# Patient Record
Sex: Male | Born: 1935 | Race: White | Hispanic: No | Smoking: Never smoker
Health system: Southern US, Community
[De-identification: ages and names within clinical notes are randomized; demographics above are authoritative.]

## PROBLEM LIST (undated history)

## (undated) DIAGNOSIS — I10 Essential (primary) hypertension: Secondary | ICD-10-CM

## (undated) DIAGNOSIS — D72829 Elevated white blood cell count, unspecified: Secondary | ICD-10-CM

## (undated) DIAGNOSIS — I5032 Chronic diastolic (congestive) heart failure: Secondary | ICD-10-CM

## (undated) DIAGNOSIS — E46 Unspecified protein-calorie malnutrition: Secondary | ICD-10-CM

## (undated) DIAGNOSIS — E785 Hyperlipidemia, unspecified: Secondary | ICD-10-CM

## (undated) DIAGNOSIS — L03115 Cellulitis of right lower limb: Secondary | ICD-10-CM

## (undated) DIAGNOSIS — N183 Chronic kidney disease, stage 3 unspecified: Secondary | ICD-10-CM

## (undated) DIAGNOSIS — R05 Cough: Secondary | ICD-10-CM

## (undated) DIAGNOSIS — R053 Chronic cough: Secondary | ICD-10-CM

## (undated) DIAGNOSIS — I739 Peripheral vascular disease, unspecified: Secondary | ICD-10-CM

## (undated) DIAGNOSIS — C801 Malignant (primary) neoplasm, unspecified: Secondary | ICD-10-CM

## (undated) DIAGNOSIS — W19XXXA Unspecified fall, initial encounter: Secondary | ICD-10-CM

## (undated) DIAGNOSIS — E119 Type 2 diabetes mellitus without complications: Secondary | ICD-10-CM

## (undated) DIAGNOSIS — I509 Heart failure, unspecified: Secondary | ICD-10-CM

## (undated) DIAGNOSIS — R531 Weakness: Secondary | ICD-10-CM

## (undated) DIAGNOSIS — I4891 Unspecified atrial fibrillation: Secondary | ICD-10-CM

## (undated) DIAGNOSIS — I2510 Atherosclerotic heart disease of native coronary artery without angina pectoris: Secondary | ICD-10-CM

## (undated) DIAGNOSIS — E871 Hypo-osmolality and hyponatremia: Secondary | ICD-10-CM

## (undated) HISTORY — DX: Type 2 diabetes mellitus without complications: E11.9

## (undated) HISTORY — DX: Unspecified fall, initial encounter: W19.XXXA

## (undated) HISTORY — DX: Unspecified atrial fibrillation: I48.91

## (undated) HISTORY — DX: Chronic kidney disease, stage 3 unspecified: N18.30

## (undated) HISTORY — DX: Hypo-osmolality and hyponatremia: E87.1

## (undated) HISTORY — DX: Chronic diastolic (congestive) heart failure: I50.32

## (undated) HISTORY — DX: Chronic cough: R05.3

## (undated) HISTORY — DX: Hyperlipidemia, unspecified: E78.5

## (undated) HISTORY — DX: Cough: R05

## (undated) HISTORY — PX: CORONARY ARTERY BYPASS GRAFT: SHX141

## (undated) HISTORY — DX: Unspecified protein-calorie malnutrition: E46

## (undated) HISTORY — DX: Essential (primary) hypertension: I10

## (undated) HISTORY — PX: PACEMAKER INSERTION: SHX728

## (undated) HISTORY — PX: CARDIAC CATHETERIZATION: SHX172

## (undated) HISTORY — PX: CHOLECYSTECTOMY: SHX55

## (undated) HISTORY — DX: Elevated white blood cell count, unspecified: D72.829

## (undated) HISTORY — DX: Peripheral vascular disease, unspecified: I73.9

## (undated) HISTORY — DX: Weakness: R53.1

## (undated) HISTORY — DX: Chronic kidney disease, stage 3 (moderate): N18.3

## (undated) HISTORY — DX: Atherosclerotic heart disease of native coronary artery without angina pectoris: I25.10

## (undated) HISTORY — DX: Cellulitis of right lower limb: L03.115

---

## 1997-06-18 ENCOUNTER — Ambulatory Visit (HOSPITAL_COMMUNITY): Admission: RE | Admit: 1997-06-18 | Discharge: 1997-06-18 | Payer: Self-pay | Admitting: Internal Medicine

## 1997-09-26 ENCOUNTER — Ambulatory Visit (HOSPITAL_COMMUNITY): Admission: RE | Admit: 1997-09-26 | Discharge: 1997-09-26 | Payer: Self-pay | Admitting: Gastroenterology

## 1998-01-02 ENCOUNTER — Encounter (HOSPITAL_COMMUNITY): Payer: Self-pay | Admitting: Oncology

## 1998-01-02 ENCOUNTER — Ambulatory Visit (HOSPITAL_COMMUNITY): Admission: RE | Admit: 1998-01-02 | Discharge: 1998-01-02 | Payer: Self-pay | Admitting: Oncology

## 1998-01-18 HISTORY — PX: CATARACT EXTRACTION: SUR2

## 1998-02-21 ENCOUNTER — Encounter: Payer: Self-pay | Admitting: Neurosurgery

## 1998-02-21 ENCOUNTER — Inpatient Hospital Stay (HOSPITAL_COMMUNITY): Admission: RE | Admit: 1998-02-21 | Discharge: 1998-02-22 | Payer: Self-pay | Admitting: Neurosurgery

## 1998-04-09 ENCOUNTER — Ambulatory Visit (HOSPITAL_COMMUNITY): Admission: RE | Admit: 1998-04-09 | Discharge: 1998-04-09 | Payer: Self-pay | Admitting: Neurosurgery

## 1998-04-09 ENCOUNTER — Encounter: Payer: Self-pay | Admitting: Neurosurgery

## 1998-05-20 ENCOUNTER — Encounter: Payer: Self-pay | Admitting: Neurosurgery

## 1998-05-20 ENCOUNTER — Ambulatory Visit (HOSPITAL_COMMUNITY): Admission: RE | Admit: 1998-05-20 | Discharge: 1998-05-20 | Payer: Self-pay | Admitting: Neurosurgery

## 1999-03-23 ENCOUNTER — Encounter: Payer: Self-pay | Admitting: Emergency Medicine

## 1999-03-23 ENCOUNTER — Inpatient Hospital Stay (HOSPITAL_COMMUNITY): Admission: EM | Admit: 1999-03-23 | Discharge: 1999-03-25 | Payer: Self-pay | Admitting: Emergency Medicine

## 1999-07-21 ENCOUNTER — Ambulatory Visit (HOSPITAL_COMMUNITY): Admission: RE | Admit: 1999-07-21 | Discharge: 1999-07-22 | Payer: Self-pay | Admitting: Cardiology

## 1999-10-15 ENCOUNTER — Ambulatory Visit (HOSPITAL_COMMUNITY): Admission: RE | Admit: 1999-10-15 | Discharge: 1999-10-15 | Payer: Self-pay | Admitting: Gastroenterology

## 2000-02-05 ENCOUNTER — Encounter: Payer: Self-pay | Admitting: Cardiology

## 2000-02-06 ENCOUNTER — Inpatient Hospital Stay (HOSPITAL_COMMUNITY): Admission: RE | Admit: 2000-02-06 | Discharge: 2000-02-18 | Payer: Self-pay | Admitting: Cardiology

## 2000-02-09 ENCOUNTER — Encounter: Payer: Self-pay | Admitting: Cardiothoracic Surgery

## 2000-02-10 ENCOUNTER — Encounter: Payer: Self-pay | Admitting: Cardiothoracic Surgery

## 2000-02-11 ENCOUNTER — Encounter: Payer: Self-pay | Admitting: Cardiothoracic Surgery

## 2000-02-13 ENCOUNTER — Encounter: Payer: Self-pay | Admitting: Thoracic Surgery (Cardiothoracic Vascular Surgery)

## 2000-02-15 ENCOUNTER — Encounter: Payer: Self-pay | Admitting: Cardiothoracic Surgery

## 2000-02-16 ENCOUNTER — Encounter: Payer: Self-pay | Admitting: Cardiothoracic Surgery

## 2000-04-12 ENCOUNTER — Encounter (HOSPITAL_COMMUNITY): Admission: RE | Admit: 2000-04-12 | Discharge: 2000-07-10 | Payer: Self-pay | Admitting: Cardiology

## 2000-07-11 ENCOUNTER — Encounter (HOSPITAL_COMMUNITY): Admission: RE | Admit: 2000-07-11 | Discharge: 2000-10-09 | Payer: Self-pay | Admitting: Cardiology

## 2000-10-10 ENCOUNTER — Encounter (HOSPITAL_COMMUNITY): Admission: RE | Admit: 2000-10-10 | Discharge: 2001-01-08 | Payer: Self-pay | Admitting: Cardiology

## 2001-01-18 ENCOUNTER — Encounter (HOSPITAL_COMMUNITY): Admission: RE | Admit: 2001-01-18 | Discharge: 2001-04-18 | Payer: Self-pay | Admitting: Cardiology

## 2001-04-19 ENCOUNTER — Encounter (HOSPITAL_COMMUNITY): Admission: RE | Admit: 2001-04-19 | Discharge: 2001-07-18 | Payer: Self-pay | Admitting: Cardiology

## 2001-07-19 ENCOUNTER — Encounter (HOSPITAL_COMMUNITY): Admission: RE | Admit: 2001-07-19 | Discharge: 2001-10-17 | Payer: Self-pay | Admitting: Cardiology

## 2001-10-18 ENCOUNTER — Encounter (HOSPITAL_COMMUNITY): Admission: RE | Admit: 2001-10-18 | Discharge: 2002-01-16 | Payer: Self-pay | Admitting: Cardiology

## 2002-01-18 ENCOUNTER — Encounter (HOSPITAL_COMMUNITY): Admission: RE | Admit: 2002-01-18 | Discharge: 2002-04-18 | Payer: Self-pay | Admitting: Cardiology

## 2002-04-19 ENCOUNTER — Encounter (HOSPITAL_COMMUNITY): Admission: RE | Admit: 2002-04-19 | Discharge: 2002-07-18 | Payer: Self-pay | Admitting: Cardiology

## 2002-07-19 ENCOUNTER — Encounter (HOSPITAL_COMMUNITY): Admission: RE | Admit: 2002-07-19 | Discharge: 2002-10-17 | Payer: Self-pay | Admitting: Cardiology

## 2002-08-21 ENCOUNTER — Inpatient Hospital Stay (HOSPITAL_COMMUNITY): Admission: EM | Admit: 2002-08-21 | Discharge: 2002-08-23 | Payer: Self-pay | Admitting: Emergency Medicine

## 2002-08-23 ENCOUNTER — Encounter: Payer: Self-pay | Admitting: Cardiology

## 2002-10-19 ENCOUNTER — Encounter (HOSPITAL_COMMUNITY): Admission: RE | Admit: 2002-10-19 | Discharge: 2003-01-17 | Payer: Self-pay | Admitting: Cardiology

## 2003-01-19 ENCOUNTER — Encounter (HOSPITAL_COMMUNITY): Admission: RE | Admit: 2003-01-19 | Discharge: 2003-04-19 | Payer: Self-pay | Admitting: Cardiology

## 2003-04-20 ENCOUNTER — Encounter (HOSPITAL_COMMUNITY): Admission: RE | Admit: 2003-04-20 | Discharge: 2003-07-19 | Payer: Self-pay | Admitting: Cardiology

## 2003-07-23 ENCOUNTER — Encounter (HOSPITAL_COMMUNITY): Admission: RE | Admit: 2003-07-23 | Discharge: 2003-10-21 | Payer: Self-pay | Admitting: Cardiology

## 2003-10-22 ENCOUNTER — Encounter (HOSPITAL_COMMUNITY): Admission: RE | Admit: 2003-10-22 | Discharge: 2004-01-20 | Payer: Self-pay | Admitting: Cardiology

## 2004-01-21 ENCOUNTER — Encounter (HOSPITAL_COMMUNITY): Admission: RE | Admit: 2004-01-21 | Discharge: 2004-04-20 | Payer: Self-pay | Admitting: Cardiology

## 2004-05-18 ENCOUNTER — Encounter (HOSPITAL_COMMUNITY): Admission: RE | Admit: 2004-05-18 | Discharge: 2004-08-16 | Payer: Self-pay | Admitting: Cardiology

## 2004-08-19 ENCOUNTER — Encounter (HOSPITAL_COMMUNITY): Admission: RE | Admit: 2004-08-19 | Discharge: 2004-11-17 | Payer: Self-pay | Admitting: Cardiology

## 2004-11-18 ENCOUNTER — Encounter (HOSPITAL_COMMUNITY): Admission: RE | Admit: 2004-11-18 | Discharge: 2005-02-16 | Payer: Self-pay | Admitting: Cardiology

## 2004-12-18 ENCOUNTER — Ambulatory Visit (HOSPITAL_COMMUNITY): Admission: RE | Admit: 2004-12-18 | Discharge: 2004-12-18 | Payer: Self-pay | Admitting: Gastroenterology

## 2004-12-18 ENCOUNTER — Encounter (INDEPENDENT_AMBULATORY_CARE_PROVIDER_SITE_OTHER): Payer: Self-pay | Admitting: Specialist

## 2005-02-18 ENCOUNTER — Encounter (HOSPITAL_COMMUNITY): Admission: RE | Admit: 2005-02-18 | Discharge: 2005-05-19 | Payer: Self-pay | Admitting: Cardiology

## 2005-03-08 ENCOUNTER — Ambulatory Visit (HOSPITAL_COMMUNITY): Admission: RE | Admit: 2005-03-08 | Discharge: 2005-03-08 | Payer: Self-pay | Admitting: Gastroenterology

## 2005-05-20 ENCOUNTER — Encounter (HOSPITAL_COMMUNITY): Admission: RE | Admit: 2005-05-20 | Discharge: 2005-08-18 | Payer: Self-pay | Admitting: Cardiology

## 2005-08-19 ENCOUNTER — Encounter (HOSPITAL_COMMUNITY): Admission: RE | Admit: 2005-08-19 | Discharge: 2005-11-17 | Payer: Self-pay | Admitting: Cardiology

## 2005-11-18 ENCOUNTER — Encounter (HOSPITAL_COMMUNITY): Admission: RE | Admit: 2005-11-18 | Discharge: 2006-02-16 | Payer: Self-pay | Admitting: Cardiology

## 2006-02-18 ENCOUNTER — Encounter (HOSPITAL_COMMUNITY): Admission: RE | Admit: 2006-02-18 | Discharge: 2006-05-19 | Payer: Self-pay | Admitting: Cardiology

## 2006-05-20 ENCOUNTER — Encounter (HOSPITAL_COMMUNITY): Admission: RE | Admit: 2006-05-20 | Discharge: 2006-08-18 | Payer: Self-pay | Admitting: Cardiology

## 2006-08-19 ENCOUNTER — Encounter (HOSPITAL_COMMUNITY): Admission: RE | Admit: 2006-08-19 | Discharge: 2006-11-17 | Payer: Self-pay | Admitting: Cardiology

## 2006-11-19 ENCOUNTER — Encounter (HOSPITAL_COMMUNITY): Admission: RE | Admit: 2006-11-19 | Discharge: 2007-01-17 | Payer: Self-pay | Admitting: Cardiology

## 2007-01-19 ENCOUNTER — Encounter (HOSPITAL_COMMUNITY): Admission: RE | Admit: 2007-01-19 | Discharge: 2007-04-13 | Payer: Self-pay | Admitting: Cardiology

## 2007-04-19 ENCOUNTER — Encounter (HOSPITAL_COMMUNITY): Admission: RE | Admit: 2007-04-19 | Discharge: 2007-07-18 | Payer: Self-pay | Admitting: Cardiology

## 2007-07-20 ENCOUNTER — Encounter (HOSPITAL_COMMUNITY): Admission: RE | Admit: 2007-07-20 | Discharge: 2007-10-18 | Payer: Self-pay | Admitting: Cardiology

## 2007-10-19 ENCOUNTER — Encounter (HOSPITAL_COMMUNITY): Admission: RE | Admit: 2007-10-19 | Discharge: 2008-01-15 | Payer: Self-pay | Admitting: Cardiology

## 2008-01-19 ENCOUNTER — Encounter (HOSPITAL_COMMUNITY): Admission: RE | Admit: 2008-01-19 | Discharge: 2008-04-18 | Payer: Self-pay | Admitting: Cardiology

## 2008-04-19 ENCOUNTER — Encounter (HOSPITAL_COMMUNITY): Admission: RE | Admit: 2008-04-19 | Discharge: 2008-07-18 | Payer: Self-pay | Admitting: Cardiology

## 2008-07-17 ENCOUNTER — Encounter: Payer: Self-pay | Admitting: Internal Medicine

## 2008-07-19 ENCOUNTER — Encounter (HOSPITAL_COMMUNITY): Admission: RE | Admit: 2008-07-19 | Discharge: 2008-10-17 | Payer: Self-pay | Admitting: Cardiology

## 2008-10-18 ENCOUNTER — Encounter (HOSPITAL_COMMUNITY): Admission: RE | Admit: 2008-10-18 | Discharge: 2009-01-16 | Payer: Self-pay | Admitting: Cardiology

## 2009-01-18 ENCOUNTER — Encounter (HOSPITAL_COMMUNITY): Admission: RE | Admit: 2009-01-18 | Discharge: 2009-04-18 | Payer: Self-pay | Admitting: Cardiology

## 2009-04-19 ENCOUNTER — Encounter (HOSPITAL_COMMUNITY): Admission: RE | Admit: 2009-04-19 | Discharge: 2009-07-18 | Payer: Self-pay | Admitting: Cardiology

## 2009-07-19 ENCOUNTER — Encounter (HOSPITAL_COMMUNITY): Admission: RE | Admit: 2009-07-19 | Discharge: 2009-10-17 | Payer: Self-pay | Admitting: Cardiology

## 2009-07-25 DIAGNOSIS — I4891 Unspecified atrial fibrillation: Secondary | ICD-10-CM | POA: Insufficient documentation

## 2009-07-25 DIAGNOSIS — I10 Essential (primary) hypertension: Secondary | ICD-10-CM | POA: Insufficient documentation

## 2009-07-25 DIAGNOSIS — I2510 Atherosclerotic heart disease of native coronary artery without angina pectoris: Secondary | ICD-10-CM | POA: Insufficient documentation

## 2009-07-28 ENCOUNTER — Ambulatory Visit: Payer: Self-pay | Admitting: Internal Medicine

## 2009-07-28 DIAGNOSIS — Z950 Presence of cardiac pacemaker: Secondary | ICD-10-CM

## 2009-07-29 ENCOUNTER — Encounter: Payer: Self-pay | Admitting: Cardiovascular Disease

## 2009-07-29 LAB — CONVERTED CEMR LAB
POC INR: 1.6
Prothrombin Time: 17.2 s

## 2009-07-30 ENCOUNTER — Encounter: Payer: Self-pay | Admitting: Internal Medicine

## 2009-07-30 LAB — CONVERTED CEMR LAB
INR: 1.6 — ABNORMAL HIGH (ref 0.8–1.0)
Prothrombin Time: 17.2 s — ABNORMAL HIGH (ref 9.7–11.8)

## 2009-08-04 ENCOUNTER — Encounter: Payer: Self-pay | Admitting: Internal Medicine

## 2009-08-11 ENCOUNTER — Ambulatory Visit: Payer: Self-pay | Admitting: Cardiology

## 2009-08-11 LAB — CONVERTED CEMR LAB: POC INR: 2.7

## 2009-08-13 ENCOUNTER — Encounter: Payer: Self-pay | Admitting: Internal Medicine

## 2009-09-01 ENCOUNTER — Encounter: Payer: Self-pay | Admitting: Cardiology

## 2009-09-02 ENCOUNTER — Encounter: Payer: Self-pay | Admitting: Cardiology

## 2009-09-25 ENCOUNTER — Ambulatory Visit: Payer: Self-pay | Admitting: Internal Medicine

## 2009-09-25 LAB — CONVERTED CEMR LAB: POC INR: 2.2

## 2009-10-18 ENCOUNTER — Encounter (HOSPITAL_COMMUNITY)
Admission: RE | Admit: 2009-10-18 | Discharge: 2010-01-16 | Payer: Self-pay | Source: Home / Self Care | Attending: Internal Medicine | Admitting: Internal Medicine

## 2009-10-21 ENCOUNTER — Ambulatory Visit: Payer: Self-pay | Admitting: Cardiology

## 2009-10-27 ENCOUNTER — Encounter: Payer: Self-pay | Admitting: Internal Medicine

## 2009-12-01 ENCOUNTER — Ambulatory Visit: Payer: Self-pay | Admitting: Cardiology

## 2009-12-01 ENCOUNTER — Ambulatory Visit: Payer: Self-pay | Admitting: Internal Medicine

## 2009-12-01 ENCOUNTER — Telehealth: Payer: Self-pay | Admitting: Internal Medicine

## 2009-12-03 ENCOUNTER — Telehealth (INDEPENDENT_AMBULATORY_CARE_PROVIDER_SITE_OTHER): Payer: Self-pay | Admitting: *Deleted

## 2009-12-25 ENCOUNTER — Telehealth: Payer: Self-pay | Admitting: Internal Medicine

## 2009-12-29 ENCOUNTER — Ambulatory Visit: Payer: Self-pay

## 2009-12-30 ENCOUNTER — Encounter: Payer: Self-pay | Admitting: Internal Medicine

## 2009-12-31 ENCOUNTER — Encounter: Payer: Self-pay | Admitting: Internal Medicine

## 2009-12-31 ENCOUNTER — Ambulatory Visit: Payer: Self-pay | Admitting: Internal Medicine

## 2010-01-01 ENCOUNTER — Encounter: Payer: Self-pay | Admitting: Internal Medicine

## 2010-01-01 LAB — CONVERTED CEMR LAB
Basophils Absolute: 0 10*3/uL (ref 0.0–0.1)
Calcium: 8.5 mg/dL (ref 8.4–10.5)
Digitoxin Lvl: 1.3 ng/mL (ref 0.8–2.0)
GFR calc non Af Amer: 56.95 mL/min — ABNORMAL LOW (ref >60.00)
Glucose, Bld: 127 mg/dL — ABNORMAL HIGH (ref 70–99)
Hemoglobin: 13.2 g/dL (ref 13.0–17.0)
INR: 2.2 — ABNORMAL HIGH (ref 0.8–1.0)
Lymphocytes Relative: 31.8 % (ref 12.0–46.0)
Monocytes Relative: 10.4 % (ref 3.0–12.0)
Neutro Abs: 3.8 10*3/uL (ref 1.4–7.7)
Platelets: 145 10*3/uL — ABNORMAL LOW (ref 150.0–400.0)
RDW: 13.9 % (ref 11.5–14.6)
Sodium: 138 meq/L (ref 135–145)
WBC: 7 10*3/uL (ref 4.5–10.5)
aPTT: 33.7 s — ABNORMAL HIGH (ref 21.7–28.8)

## 2010-01-07 ENCOUNTER — Ambulatory Visit (HOSPITAL_COMMUNITY)
Admission: RE | Admit: 2010-01-07 | Discharge: 2010-01-07 | Payer: Self-pay | Source: Home / Self Care | Attending: Internal Medicine | Admitting: Internal Medicine

## 2010-01-15 ENCOUNTER — Ambulatory Visit: Payer: Self-pay

## 2010-01-20 ENCOUNTER — Encounter (HOSPITAL_COMMUNITY)
Admission: RE | Admit: 2010-01-20 | Discharge: 2010-02-17 | Payer: Self-pay | Source: Home / Self Care | Attending: Internal Medicine | Admitting: Internal Medicine

## 2010-01-22 ENCOUNTER — Ambulatory Visit: Admission: RE | Admit: 2010-01-22 | Discharge: 2010-01-22 | Payer: Self-pay | Source: Home / Self Care

## 2010-01-22 ENCOUNTER — Encounter: Payer: Self-pay | Admitting: Internal Medicine

## 2010-02-02 ENCOUNTER — Telehealth: Payer: Self-pay | Admitting: Internal Medicine

## 2010-02-18 ENCOUNTER — Encounter (HOSPITAL_COMMUNITY): Payer: Self-pay | Attending: Internal Medicine

## 2010-02-18 DIAGNOSIS — Z8249 Family history of ischemic heart disease and other diseases of the circulatory system: Secondary | ICD-10-CM | POA: Insufficient documentation

## 2010-02-18 DIAGNOSIS — Z5189 Encounter for other specified aftercare: Secondary | ICD-10-CM | POA: Insufficient documentation

## 2010-02-18 DIAGNOSIS — Z951 Presence of aortocoronary bypass graft: Secondary | ICD-10-CM | POA: Insufficient documentation

## 2010-02-18 DIAGNOSIS — E7800 Pure hypercholesterolemia, unspecified: Secondary | ICD-10-CM | POA: Insufficient documentation

## 2010-02-18 DIAGNOSIS — I2510 Atherosclerotic heart disease of native coronary artery without angina pectoris: Secondary | ICD-10-CM | POA: Insufficient documentation

## 2010-02-18 DIAGNOSIS — I10 Essential (primary) hypertension: Secondary | ICD-10-CM | POA: Insufficient documentation

## 2010-02-18 DIAGNOSIS — E119 Type 2 diabetes mellitus without complications: Secondary | ICD-10-CM | POA: Insufficient documentation

## 2010-02-19 ENCOUNTER — Encounter: Payer: Self-pay | Admitting: Cardiology

## 2010-02-19 ENCOUNTER — Ambulatory Visit: Admit: 2010-02-19 | Payer: Self-pay

## 2010-02-19 ENCOUNTER — Encounter (INDEPENDENT_AMBULATORY_CARE_PROVIDER_SITE_OTHER): Payer: 59

## 2010-02-19 DIAGNOSIS — Z7901 Long term (current) use of anticoagulants: Secondary | ICD-10-CM

## 2010-02-19 DIAGNOSIS — I4891 Unspecified atrial fibrillation: Secondary | ICD-10-CM

## 2010-02-19 NOTE — Letter (Signed)
Summary: [facility] Extended Visit Note   [facility] Extended Visit Note   Imported By: Roderic Ovens 11/05/2009 14:55:39  _____________________________________________________________________  External Attachment:    Type:   Image     Comment:   External Document

## 2010-02-19 NOTE — Miscellaneous (Signed)
Summary: Orders Update  Clinical Lists Changes  Orders: Added new Test order of T-Digoxin ([id number]) - Signed 

## 2010-02-19 NOTE — Letter (Signed)
Summary: Implantable Device Instructions  Architectural technologist, Main Office  [address]   [place], [place] [address]   Phone: [phone]  Fax: [phone]      Implantable Device Instructions  You are scheduled for:   _____ Generator Change  on 01/07/10 with Dr. Ladona Ridgel.  1.  Please arrive at the Short Stay Center at [facility] at 6:30am on the day of your procedure.  2.  Do not eat or drink after midnight the night before your procedure.  3.  Complete lab work on 12/31/09.   You do not have to be fasting.  4.  Do NOT take these medications for ____ days prior to your procedure:  Fuoremide Glipizide and Actos.    5.  Plan for an overnight stay.  Bring your insurance cards and a list of your medications.  6.  Wash your chest and neck with antibacterial soap (any brand) the evening before and the morning of your procedure.  Rinse well.   *If you have ANY questions after you get home, please call the office [phone]. Alice Rieger  *Every attempt is made to prevent procedures from being rescheduled.  Due to the nauture of Electrophysiology, rescheduling can happen.  The physician is always aware and directs the staff when this occurs.

## 2010-02-19 NOTE — Medication Information (Signed)
Summary: Coumadin Clinic  Anticoagulant Therapy  Managed by: Weston Brass, PharmD Referring MD: Tora Kindred MD: Shirlee Latch MD, Freida Busman Indication 1: Atrial Fibrillation Lab Used: [facility] [place] Site: [address] PT 18.5 INR POC 1.9 INR RANGE 2.0-3.0  Dietary changes: yes       Details: increased salads on vacation   Health status changes: no    Bleeding/hemorrhagic complications: no    Recent/future hospitalizations: no    Any changes in medication regimen? no    Recent/future dental: no  Any missed doses?: no       Is patient compliant with meds? yes       Allergies: 1)  ! Penicillin  Anticoagulation Management History:      His anticoagulation is being managed by telephone today.  Positive risk factors for bleeding include an age of 75 years or older.  The bleeding index is 'intermediate risk'.  Positive CHADS2 values include History of HTN and Age > 75 years old.  His last INR was 1.6 ratio.  Prothrombin time is 18.5.  Anticoagulation responsible provider: Shirlee Latch MD, Linnea Todisco.  INR POC: 1.9.    Anticoagulation Management Assessment/Plan:      The patient's current anticoagulation dose is Coumadin 5 mg tabs: Take as directed.  The next INR is due 09/24/2009.  Anticoagulation instructions were given to patient.  Results were reviewed/authorized by Weston Brass, PharmD.  He was notified by Weston Brass PharmD.         Prior Anticoagulation Instructions: INR 2.7 Continue 5mg s everyday except 2.5mg s on Tuesdays and Thursdays. Recheck in 3 weeks.   Current Anticoagulation Instructions: INR 1.9 LM for pt in [place]. Bethena Midget, RN, BSN  September 02, 2009 2:24 PM  Spoke with pt.  Take 1 tablet today then resume same dose of 1 tablet every day except 1/2 tablet on Tuesday and Thursday.  Recheck INR in 3 weeks.

## 2010-02-19 NOTE — Cardiovascular Report (Signed)
Summary: Pre Op Orders   Pre Op Orders   Imported By: Roderic Ovens 01/23/2010 16:14:36  _____________________________________________________________________  External Attachment:    Type:   Image     Comment:   External Document

## 2010-02-19 NOTE — Cardiovascular Report (Signed)
Summary: Office Visit   Office Visit   Imported By: Roderic Ovens 12/23/2009 11:04:25  _____________________________________________________________________  External Attachment:    Type:   Image     Comment:   External Document

## 2010-02-19 NOTE — Medication Information (Signed)
Summary: Oscar Bruce  Anticoagulant Therapy  Managed by: Reina Fuse, PharmD Referring MD: Tora Kindred MD: Shirlee Latch MD, Dalton Indication 1: Atrial Fibrillation Lab Used: [facility] [place] Site: [address] INR POC 2.7 INR RANGE 2.0-3.0  Dietary changes: no    Health status changes: no    Bleeding/hemorrhagic complications: no    Recent/future hospitalizations: no    Any changes in medication regimen? yes       Details: Clindamycin before dental appt. yesterday  Recent/future dental: no  Any missed doses?: no       Is patient compliant with meds? yes       Current Medications (verified): 1)  Coumadin 5 Mg Tabs (Warfarin Sodium) .... Take As Directed 2)  Ramipril 5 Mg Caps (Ramipril) .... Take One Capsule By Mouth Daily 3)  Maxzide-25 37.5-25 Mg Tabs (Triamterene-Hctz) .... Take 1/2 Tablet Daily 4)  Furosemide 40 Mg Tabs (Furosemide) .... As Needed 5)  Clonidine Hcl 0.2 Mg Tabs (Clonidine Hcl) .... Take One Tablet By Mouth Once A Day 6)  Guanabenz Acetate 4 Mg Tabs (Guanabenz Acetate) .... Take 1 Tablet By Mouth Once A Day 7)  Nitrostat 0.4 Mg Subl (Nitroglycerin) .Marland Kitchen.. 1 Tablet Under Tongue At Onset of Chest Pain; You May Repeat Every 5 Minutes For Up To 3 Doses. 8)  Lanoxin 0.25 Mg Tabs (Digoxin) .... Take 1 1/2 Tablets Daily 9)  Metoprolol Succinate 50 Mg Xr24h-Tab (Metoprolol Succinate) .... Take 1 Tablet By Mouth Once A Day 10)  Clindamycin Hcl 150 Mg Caps (Clindamycin Hcl) .... Take 4 Cap One Hour Before Dentist Appoint. 11)  Actos 15 Mg Tabs (Pioglitazone Hcl) .... Take 1 Tablet By Mouth Once A Day 12)  Glipizide Xl 5 Mg Xr24h-Tab (Glipizide) .... Take 1/2 Tablet Daily 13)  Lipitor 40 Mg Tabs (Atorvastatin Calcium) .... Take  1/2 Tablet Daily 14)  Byetta 5 Mcg Pen 5 Mcg/0.70ml Soln (Exenatide) .... Inject 5mg  Two Times A Day 15)  Tylenol Extra Strength 500 Mg Tabs (Acetaminophen) .... As Needed 16)  Maalox 600 Mg Chew (Calcium Carbonate Antacid)  .... As Needed 17)  Multivitamins  Tabs (Multiple Vitamin) .... Take 1 Tablet By Mouth Once A Day 18)  Glucophage Xr 500 Mg Xr24h-Tab (Metformin Hcl) .... Take 1 Tablet By Mouth Once A Day  Allergies (verified): 1)  ! Penicillin  Anticoagulation Management History:      The patient is taking warfarin and comes in today for a routine follow up visit.  Positive risk factors for bleeding include an age of 28 years or older.  The bleeding index is 'intermediate risk'.  Positive CHADS2 values include History of HTN and Age > 19 years old.  His last INR was 1.6 ratio.  Anticoagulation responsible provider: Shirlee Latch MD, [place].  INR POC: 2.7.  Cuvette Lot#: [id number].  Exp: 11/2009.    Anticoagulation Management Assessment/Plan:      The patient's current anticoagulation dose is Coumadin 5 mg tabs: Take as directed.  The target INR is 2.0-3.0.  The next INR is due 12/01/2009.  Anticoagulation instructions were given to patient.  Results were reviewed/authorized by Reina Fuse, PharmD.  He was notified by Reina Fuse PharmD.         Prior Anticoagulation Instructions: INR 2.2  Continue on same dosage 1 tablet daily except 1/2 tablet on Tuesdays and Thursdays.  Recheck in 4 weeks.    Current Anticoagulation Instructions: INR 2.7  Continue taking Coumadin 1 tab (5 mg) on Sun, Mon, Wed, Fri,  Sat and Coumadin 0.5 (2.5 mg) on Tues and Thur. Return to clinic on 11/14 after trip and to coincide with MD appt.

## 2010-02-19 NOTE — Progress Notes (Signed)
Summary: 12/21 date pt available  Phone Note Call from Patient Call back at Home Phone [phone]   Caller: Daughter-karen [phone] Reason for Call: Talk to Nurse Summary of Call: 12/21 is the date pt is ava. for procedure.  Initial call taken by: Lorne Skeens,  December 01, 2009 1:07 PM  Follow-up for Phone Call        spoke with daughter ok for procedure on 01/07/10  labs on 12/31/09  will call pt with time for labs and time to come in to get instructions Dennis Bast, RN, BSN  December 01, 2009 3:39 PM

## 2010-02-19 NOTE — Medication Information (Signed)
Summary: rov/sl  Anticoagulant Therapy  Managed by: Lyna Poser, PharmD Referring MD: Tora Kindred MD: Shirlee Latch MD, Dalton Indication 1: Atrial Fibrillation Lab Used: [facility] [place] Site: [address] INR POC 2 INR RANGE 2.0-3.0  Dietary changes: yes       Details: spent a month in [place] so diet has changed, can't say whether he had more or less greens   Health status changes: no    Bleeding/hemorrhagic complications: no    Recent/future hospitalizations: yes       Details: possible colonscopy, hasn't scheduled yet  Any changes in medication regimen? no    Recent/future dental: no  Any missed doses?: no       Is patient compliant with meds? yes       Allergies: 1)  ! Penicillin  Anticoagulation Management History:      The patient is taking warfarin and comes in today for a routine follow up visit.  Positive risk factors for bleeding include an age of 75 years or older.  The bleeding index is 'intermediate risk'.  Positive CHADS2 values include History of HTN and Age > 16 years old.  His last INR was 1.6 ratio.  Anticoagulation responsible provider: Shirlee Latch MD, [place].  INR POC: 2.  Cuvette Lot#: [id number].  Exp: 11/2009.    Anticoagulation Management Assessment/Plan:      The patient's current anticoagulation dose is Coumadin 5 mg tabs: Take as directed.  The target INR is 2.0-3.0.  The next INR is due 12/29/2009.  Anticoagulation instructions were given to patient.  Results were reviewed/authorized by Lyna Poser, PharmD.         Prior Anticoagulation Instructions: INR 2.7  Continue taking Coumadin 1 tab (5 mg) on Sun, Mon, Wed, Fri, Sat and Coumadin 0.5 (2.5 mg) on Tues and Thur. Return to clinic on 11/14 after trip and to coincide with MD appt.  Current Anticoagulation Instructions: INR 2  Continue taking a half tablet on tuesday and thursday. And 1 tablet all other days. Recheck in 4 weeks.

## 2010-02-19 NOTE — Progress Notes (Signed)
Summary: Records Request  Faxed OV (Preliminary Report) to [facility] at [facility]. ([phone]). Debby Freiberg  December 03, 2009 2:33 PM

## 2010-02-19 NOTE — Medication Information (Signed)
Summary: Coumadin Clinic  Anticoagulant Therapy  Managed by: Weston Brass, PharmD Referring MD: Tora Kindred MD: Clifton Akon MD, Cristal Deer Indication 1: Atrial Fibrillation Lab Used: [facility] [place] Site: [address] PT 17.2 INR POC 1.6 INR RANGE 2.0-3.0  Dietary changes: yes       Details[place] so diet [place] been [place]  Health status changes: no    Bleeding/hemorrhagic complications: no    Recent/future hospitalizations: no    Any changes in medication regimen? no    Recent/future dental: no  Any missed doses?: no       Is patient compliant with meds? yes      Comments: Pt previously followed at Dr. Adelene Idler office.  Has not had INR checked in 3 months.  Is transitioning care to Dr. Ladona Ridgel and will start having INRs followed by our Coumadin clinic.   Allergies: 1)  ! Penicillin  Anticoagulation Management History:      The patient comes in today for his initial visit for anticoagulation therapy.  Positive risk factors for bleeding include an age of 75 years or older.  The bleeding index is 'intermediate risk'.  Positive CHADS2 values include History of HTN and Age > 41 years old.  Prothrombin time is 17.2.  Anticoagulation responsible provider: Clifton Stanislaus MD, Cristal Deer.  INR POC: 1.6.    Anticoagulation Management Assessment/Plan:      The patient's current anticoagulation dose is Coumadin 5 mg tabs: Take as directed.  The next INR is due 08/11/2009.  Anticoagulation instructions were given to patient.  Results were reviewed/authorized by Weston Brass, PharmD.  He was notified by Weston Brass PharmD.         Current Anticoagulation Instructions: INR 1.6  Spoke with pt. Take 1 tablet today then resume same dose of 1 tablet every day except 1/2 tablet on Tuesday and Thursday.  Recheck INR in 2 weeks.

## 2010-02-19 NOTE — Progress Notes (Signed)
Summary: when dose pt get lab & cvrr  Phone Note Call from Patient Call back at Home Phone [phone]   Caller: Patient Reason for Call: Talk to Nurse, Talk to Doctor Summary of Call: per pt call he is not suppose to have a appt for Monday the 12th it should be for Wednesday the 14th and he is suppose to do coumadin and labwork. I dont see it in the system Initial call taken by: Omer Jack,  December 25, 2009 1:56 PM  Follow-up for Phone Call        come on 12/31/09 at 9:30 for labs Dennis Bast, RN, BSN  December 25, 2009 3:34 PM

## 2010-02-19 NOTE — Assessment & Plan Note (Signed)
Summary: 3 month rov/sl   Visit Type:  Follow-up   History of Present Illness: Oscar Bruce is returns  today for followup of CAD, s/p CABG, bradycardia, s/p PPM, and HTN.  He has a h/o chronic atrial fib and has been on coumadin.  He denies c/p or sob and has been quite active.   His PPM was placed approximately 7 yrs ago.  Current Medications (verified): 1)  Coumadin 5 Mg Tabs (Warfarin Sodium) .... Take As Directed 2)  Maxzide-25 37.5-25 Mg Tabs (Triamterene-Hctz) .... Take 1/2 Tablet Daily 3)  Furosemide 40 Mg Tabs (Furosemide) .... As Needed 4)  Clonidine Hcl 0.2 Mg Tabs (Clonidine Hcl) .... Take One Tablet By Mouth Once A Day 5)  Guanabenz Acetate 4 Mg Tabs (Guanabenz Acetate) .... Take 1 Tablet By Mouth Once A Day 6)  Nitrostat 0.4 Mg Subl (Nitroglycerin) .Marland Kitchen.. 1 Tablet Under Tongue At Onset of Chest Pain; You May Repeat Every 5 Minutes For Up To 3 Doses. 7)  Lanoxin 0.25 Mg Tabs (Digoxin) .... Take 1 1/2 Tablets Daily 8)  Metoprolol Succinate 50 Mg Xr24h-Tab (Metoprolol Succinate) .... Take 1 Tablet By Mouth Once A Day 9)  Clindamycin Hcl 150 Mg Caps (Clindamycin Hcl) .... Take 4 Cap One Hour Before Dentist Appoint. 10)  Actos 15 Mg Tabs (Pioglitazone Hcl) .... Take 1 Tablet By Mouth Once A Day 11)  Glipizide Xl 5 Mg Xr24h-Tab (Glipizide) .... Take 1/2 Tablet Daily 12)  Lipitor 40 Mg Tabs (Atorvastatin Calcium) .... Take  1/2 Tablet Daily 13)  Byetta 5 Mcg Pen 5 Mcg/0.37ml Soln (Exenatide) .... Inject 5mg  Two Times A Day 14)  Tylenol Extra Strength 500 Mg Tabs (Acetaminophen) .... As Needed 15)  Maalox 600 Mg Chew (Calcium Carbonate Antacid) .... As Needed 16)  Multivitamins  Tabs (Multiple Vitamin) .... Take 1 Tablet By Mouth Once A Day  Allergies: 1)  ! Penicillin  Past History:  Past Medical History: Last updated: 07/25/2009 Current Problems:  ATRIAL FIBRILLATION (ICD-427.31) HYPERTENSION (ICD-401.9) CAD (ICD-414.00)    Review of Systems  The patient denies chest  pain, syncope, dyspnea on exertion, and peripheral edema.    Vital Signs:  Patient profile:   77 year old male Height:      68 inches Weight:      177 pounds BMI:     27.01 Pulse rate:   74 / minute BP sitting:   100 / 50  (left arm)  Vitals Entered By: Laurance Flatten CMA (December 01, 2009 10:33 AM)  Physical Exam  General:  Elderly, well developed, well nourished, in no acute distress.  HEENT: normal Neck: supple. No JVD. Carotids 2+ bilaterally no bruits Cor: RRR no rubs, gallops or murmur Lungs: CTA. Well healed PPM and midline sternotomy incision. Ab: soft, nontender. nondistended. No HSM. Good bowel sounds Ext: warm. no cyanosis or  clubbing. 1+ edema is present. Neuro: alert and oriented. Grossly nonfocal. affect pleasant    PPM Specifications Following MD:  Oscar Bunting, MD     PPM Vendor:  [employer]     PPM Model Number:  [id number]     PPM Serial Number:  [id number] PPM DOI:  08/22/2002     PPM Implanting MD:  NOT IMPLANTED BY [place]  Lead 1    Location: RA     DOI: 08/22/2002     Model #: [id number]     Serial #: [id number]     Status: active Lead 2    Location: RV  DOI: 08/22/2002     Model #: [id number]     Serial #: [id number]     Status: active  Magnet Response Rate:  BOL 98.6 ERI 86.3    PPM Follow Up Battery Voltage:  2.60 V     Battery Est. Longevity:  ERI     Pacer Dependent:  Yes     Right Ventricle  Amplitude: 9.7 mV, Impedance: 425 ohms, Threshold: 0.75 V at 0.5 msec  Episodes MS Episodes:  0     Coumadin:  Yes Ventricular High Rate:  0     Ventricular Pacing:  >99%  Parameters Mode:  VVIR     Lower Rate Limit:  70     Upper Rate Limit:  120 Tech Comments:  DEVICE REACHED ERI ON 11-26-09.  NORMAL DEVICE FUNCTION.  CHANGED RV OUTPUT FROM 1.50 TO 2.50 V.  GENERATOR CHANGE TO BE SCHEDULED. Vella Kohler December 01, 2009 10:43 AM MD Comments:  Agree with above.  Impression & Recommendations:  Problem # 1:  CARDIAC PACEMAKER IN SITU (ICD-V45.01) His device is working  normally but has reached ERI.  Will plan to schedule PPM gen change.  Problem # 2:  ATRIAL FIBRILLATION (ICD-427.31) His symptoms are well controlled. Will continue current meds. His updated medication list for this problem includes:    Coumadin 5 Mg Tabs (Warfarin sodium) .Marland Kitchen... Take as directed    Lanoxin 0.25 Mg Tabs (Digoxin) .Marland Kitchen... Take 1 1/2 tablets daily    Metoprolol Succinate 50 Mg Xr24h-tab (Metoprolol succinate) .Marland Kitchen... Take 1 tablet by mouth once a day  Problem # 3:  CAD (ICD-414.00) He denies anginal symptoms.  Continue meds as below. The following medications were removed from the medication list:    Ramipril 5 Mg Caps (Ramipril) .Marland Kitchen... Take one capsule by mouth daily His updated medication list for this problem includes:    Coumadin 5 Mg Tabs (Warfarin sodium) .Marland Kitchen... Take as directed    Nitrostat 0.4 Mg Subl (Nitroglycerin) .Marland Kitchen... 1 tablet under tongue at onset of chest pain; you may repeat every 5 minutes for up to 3 doses.    Metoprolol Succinate 50 Mg Xr24h-tab (Metoprolol succinate) .Marland Kitchen... Take 1 tablet by mouth once a day

## 2010-02-19 NOTE — Letter (Signed)
Summary: [facility]  [facility]   Imported By: Faythe Ghee 07/30/2009 12:17:24  _____________________________________________________________________  External Attachment:    Type:   Image     Comment:   External Document

## 2010-02-19 NOTE — Progress Notes (Signed)
Summary: pt request call  Phone Note Call from Patient Call back at Home Phone [phone]   Caller: Patient Summary of Call: Pt request call Initial call taken by: Judie Grieve,  February 02, 2010 8:43 AM  Follow-up for Phone Call        pt is worried about his BP being up  155/80 on avg  has had readings 237systolic  He says he has doubled all of his medications.  Looks as if his Ramipril was d/c on 12/01/09.  Pt states it was due to cough which he says he still has.  So it seems he could restsart his Ramipril 5mg  and keep his follow up appointment with Dr Ladona Ridgel as scheduled.  He will call me with some BP readings Dennis Bast, RN, BSN  February 02, 2010 12:48 PM  Additional Follow-up for Phone Call Additional follow up Details #1::        per pt calling - [facility] on [address] -pt # [phone] Lorne Skeens  February 02, 2010 3:17 PM     New/Updated Medications: RAMIPRIL 5 MG CAPS (RAMIPRIL) one by mouth daily Prescriptions: RAMIPRIL 5 MG CAPS (RAMIPRIL) one by mouth daily  #30 x 11   Entered by:   Dennis Bast, RN, BSN   Authorized by:   Laren Boom, MD, [facility]   Signed by:   Dennis Bast, RN, BSN on 02/02/2010   Method used:   Electronically to        [facility]* (retail)       [address]       [place], [place]  [address]       Ph: [phone]       Fax: [phone]   RxID:   [id number]

## 2010-02-19 NOTE — Assessment & Plan Note (Signed)
Summary: [id number] [facility]   Visit Type:  Initial Consult   History of Present Illness: Oscar Bruce is referred today by Dr. Adelene Idler office for ongoing evaluation and treatment of CAD, s/p CABG, bradycardia, s/p PPM, and HTN.  He has a h/o chronic atrial fib and has been on coumadin.  He denies c/p or sob and has been quite active.  He also c/o chronic cough which may be associated with the initiation of an ACE inhibitor.  The paitent denies syncope but does have some peripheral edema.  No fever/chill/night sweats.  His PPM was placed approximately 6 yrs ago.  Current Medications (verified): 1)  Coumadin 5 Mg Tabs (Warfarin Sodium) .... Take As Directed 2)  Ramipril 5 Mg Caps (Ramipril) .... Take One Capsule By Mouth Daily 3)  Maxzide-25 37.5-25 Mg Tabs (Triamterene-Hctz) .... Take 1/2 Tablet Daily 4)  Furosemide 40 Mg Tabs (Furosemide) .... As Needed 5)  Clonidine Hcl 0.2 Mg Tabs (Clonidine Hcl) .... Take One Tablet By Mouth Once A Day 6)  Guanabenz Acetate 4 Mg Tabs (Guanabenz Acetate) .... Take 1 Tablet By Mouth Once A Day 7)  Nitrostat 0.4 Mg Subl (Nitroglycerin) .Marland Kitchen.. 1 Tablet Under Tongue At Onset of Chest Pain; You May Repeat Every 5 Minutes For Up To 3 Doses. 8)  Lanoxin 0.25 Mg Tabs (Digoxin) .... Take 1 1/2 Tablets Daily 9)  Metoprolol Succinate 50 Mg Xr24h-Tab (Metoprolol Succinate) .... Take 1 1/2 Tablets Daily 10)  Clindamycin Hcl 150 Mg Caps (Clindamycin Hcl) .... Take 4 Cap One Hour Before Dentist Appoint. 11)  Actos 15 Mg Tabs (Pioglitazone Hcl) .... Take 1 Tablet By Mouth Once A Day 12)  Glipizide Xl 5 Mg Xr24h-Tab (Glipizide) .... Take 1 Tablet By Mouth Two Times A Day 13)  Lipitor 40 Mg Tabs (Atorvastatin Calcium) .... Take  1/2 Tablet Daily 14)  Byetta 5 Mcg Pen 5 Mcg/0.50ml Soln (Exenatide) .... Inject 5mg  Two Times A Day 15)  Tylenol Extra Strength 500 Mg Tabs (Acetaminophen) .... As Needed 16)  Maalox 600 Mg Chew (Calcium Carbonate Antacid) .... As  Needed 17)  Multivitamins  Tabs (Multiple Vitamin) .... Take 1 Tablet By Mouth Once A Day  Allergies (verified): 1)  ! Penicillin  Past History:  Past Medical History: Last updated: 07/25/2009 Current Problems:  ATRIAL FIBRILLATION (ICD-427.31) HYPERTENSION (ICD-401.9) CAD (ICD-414.00)    Family History: Non-contributory at his advanced age.  Social History: Widowed. Retired Art gallery manager.tobacco or ETOH.  Review of Systems       All systems reviewed and negative except as noted in the HPI.  Vital Signs:  Patient profile:   77 year old male Height:      68 inches Weight:      187.50 pounds BMI:     28.61 Pulse rate:   70 / minute Pulse rhythm:   regular Resp:     18 per minute BP sitting:   114 / 59  (left arm) Cuff size:   large  Vitals Entered By: Vikki Ports (July 28, 2009 9:42 AM)  Physical Exam  General:  Elderly, well developed, well nourished, in no acute distress.  HEENT: normal Neck: supple. No JVD. Carotids 2+ bilaterally no bruits Cor: RRR no rubs, gallops or murmur Lungs: CTA. Well healed PPM and midline sternotomy incision. Ab: soft, nontender. nondistended. No HSM. Good bowel sounds Ext: warm. no cyanosis or  clubbing. 1+ edema is present. Neuro: alert and oriented. Grossly nonfocal. affect pleasant    PPM Specifications Following MD:  Lewayne Bunting, MD     PPM Vendor:  St Jude     PPM Model Number:  [id number]     PPM Serial Number:  [id number] PPM DOI:  08/22/2002     PPM Implanting MD:  NOT IMPLANTED BY [place]  Lead 1    Location: RA     DOI: 08/22/2002     Model #: 1488TC     Serial #: [id number]     Status: active Lead 2    Location: RV     DOI: 08/22/2002     Model #: 1336T     Serial #: [id number]     Status: active  Magnet Response Rate:  BOL 98.6 ERI 86.3    PPM Follow Up Remote Check?  No Battery Voltage:  2.66 V     Battery Est. Longevity:  0.5 YEARS     Pacer Dependent:  Yes     Right Ventricle  Impedance: 415 ohms, Threshold: 0.625 V at 0.5  msec  Episodes Coumadin:  Yes Ventricular Pacing:  99%  Parameters Mode:  VVIR     Lower Rate Limit:  70     Upper Rate Limit:  120 Next Cardiology Appt Due:  09/18/2009 Tech Comments:  No parameter changes.  Device function normal but battery near ERI.  We will see him back in the clinic in 2 months. Altha Harm, LPN  July 28, 2009 11:23 AM   Impression & Recommendations:  Problem # 1:  CARDIAC PACEMAKER IN SITU (ICD-V45.01) His device is working normally today.  Will recheck in several months.  Problem # 2:  ATRIAL FIBRILLATION (ICD-427.31) He is asymptomatic and paced.  He will continue his current meds. His rate is well controlled. His updated medication list for this problem includes:    Coumadin 5 Mg Tabs (Warfarin sodium) .Marland Kitchen... Take as directed    Lanoxin 0.25 Mg Tabs (Digoxin) .Marland Kitchen... Take 1 1/2 tablets daily    Metoprolol Succinate 50 Mg Xr24h-tab (Metoprolol succinate) .Marland Kitchen... Take 1 1/2 tablets daily  Orders: TLB-PT (Protime) (85610-PTP)  Problem # 3:  CAD (ICD-414.00) He denies anginal symptoms.  Continue meds as noted below. His updated medication list for this problem includes:    Coumadin 5 Mg Tabs (Warfarin sodium) .Marland Kitchen... Take as directed    Ramipril 5 Mg Caps (Ramipril) .Marland Kitchen... Take one capsule by mouth daily    Nitrostat 0.4 Mg Subl (Nitroglycerin) .Marland Kitchen... 1 tablet under tongue at onset of chest pain; you may repeat every 5 minutes for up to 3 doses.    Metoprolol Succinate 50 Mg Xr24h-tab (Metoprolol succinate) .Marland Kitchen... Take 1 1/2 tablets daily  Problem # 4:  HYPERTENSION (ICD-401.9) He is on multiple medications.  Today I have asked him to stop his Ramipril and we will plan to adjust his meds further when I see him back in the office. His updated medication list for this problem includes:    Ramipril 5 Mg Caps (Ramipril) .Marland Kitchen... Take one capsule by mouth daily    Maxzide-25 37.5-25 Mg Tabs (Triamterene-hctz) .Marland Kitchen... Take 1/2 tablet daily    Furosemide 40 Mg Tabs  (Furosemide) .Marland Kitchen... As needed    Clonidine Hcl 0.2 Mg Tabs (Clonidine hcl) .Marland Kitchen... Take one tablet by mouth once a day    Guanabenz Acetate 4 Mg Tabs (Guanabenz acetate) .Marland Kitchen... Take 1 tablet by mouth once a day    Metoprolol Succinate 50 Mg Xr24h-tab (Metoprolol succinate) .Marland Kitchen... Take 1 1/2 tablets daily  Problem # 5:  COUMADIN THERAPY (ICD-V58.61)  The patient has not had his coumadin level checked in several months (3) and we will check his INR today.  Patient Instructions: 1)  Your physician wants you to follow-up in: 3 months with Dr Ladona Ridgel.  You will receive a reminder letter in the mail two months in advance. If you don't receive a letter, please call our office to schedule the follow-up appointment.

## 2010-02-19 NOTE — Medication Information (Signed)
Summary: rov/sp  Anticoagulant Therapy  Managed by: Cloyde Reams, RN, BSN Referring MD: Tora Kindred MD: Ladona Ridgel MD, Sharlot Gowda Indication 1: Atrial Fibrillation Lab Used: [facility] [name] Site: [address] INR POC 2.2 INR RANGE 2.0-3.0  Dietary changes: no    Health status changes: no    Bleeding/hemorrhagic complications: no    Recent/future hospitalizations: no    Any changes in medication regimen? yes       Details: Started on Metformin, updated med list.   Recent/future dental: no  Any missed doses?: no       Is patient compliant with meds? yes       Allergies: 1)  ! Penicillin  Anticoagulation Management History:      The patient is taking warfarin and comes in today for a routine follow up visit.  Positive risk factors for bleeding include an age of 73 years or older.  The bleeding index is 'intermediate risk'.  Positive CHADS2 values include History of HTN and Age > 96 years old.  His last INR was 1.6 ratio.  Anticoagulation responsible provider: Ladona Ridgel MD, Sharlot Gowda.  INR POC: 2.2.  Cuvette Lot#: [id number].  Exp: 11/2009.    Anticoagulation Management Assessment/Plan:      The patient's current anticoagulation dose is Coumadin 5 mg tabs: Take as directed.  The target INR is 2.0-3.0.  The next INR is due 10/23/2009.  Anticoagulation instructions were given to patient.  Results were reviewed/authorized by Cloyde Reams, RN, BSN.  He was notified by Cloyde Reams RN.         Prior Anticoagulation Instructions: INR 1.9 LM for pt in [place]. Bethena Midget, RN, BSN  September 02, 2009 2:24 PM  Spoke with pt.  Take 1 tablet today then resume same dose of 1 tablet every day except 1/2 tablet on Tuesday and Thursday.  Recheck INR in 3 weeks.   Current Anticoagulation Instructions: INR 2.2  Continue on same dosage 1 tablet daily except 1/2 tablet on Tuesdays and Thursdays.  Recheck in 4 weeks.

## 2010-02-19 NOTE — Medication Information (Signed)
Summary: rov/sp  Anticoagulant Therapy  Managed by: Bethena Midget, RN, BSN Referring MD: Tora Kindred MD: Riley Kill MD, Maisie Fus Indication 1: Atrial Fibrillation Lab Used: [facility] [place] Site: [address] INR POC 2.7 INR RANGE 2.0-3.0  Dietary changes: no    Health status changes: no    Bleeding/hemorrhagic complications: no    Recent/future hospitalizations: no    Any changes in medication regimen? no    Recent/future dental: no  Any missed doses?: no       Is patient compliant with meds? yes      Comments: Number to reach pt at in [facility]: [phone]  Allergies: 1)  ! Penicillin  Anticoagulation Management History:      The patient is taking warfarin and comes in today for a routine follow up visit.  Positive risk factors for bleeding include an age of 75 years or older.  The bleeding index is 'intermediate risk'.  Positive CHADS2 values include History of HTN and Age > 31 years old.  His last INR was 1.6 ratio.  Anticoagulation responsible provider: Riley Kill MD, Maisie Fus.  INR POC: 2.7.  Cuvette Lot#: [id number].  Exp: 10/2010.    Anticoagulation Management Assessment/Plan:      The patient's current anticoagulation dose is Coumadin 5 mg tabs: Take as directed.  The next INR is due 09/01/2009.  Anticoagulation instructions were given to patient.  Results were reviewed/authorized by Bethena Midget, RN, BSN.  He was notified by Bethena Midget, RN, BSN.         Prior Anticoagulation Instructions: INR 1.6  Spoke with pt. Take 1 tablet today then resume same dose of 1 tablet every day except 1/2 tablet on Tuesday and Thursday.  Recheck INR in 2 weeks.   Current Anticoagulation Instructions: INR 2.7 Continue 5mg s everyday except 2.5mg s on Tuesdays and Thursdays. Recheck in 3 weeks.

## 2010-02-19 NOTE — Letter (Signed)
Summary: [facility] - Lab Order  [facility] - Lab Order   Imported By: Marylou Mccoy 01/01/2010 12:27:36  _____________________________________________________________________  External Attachment:    Type:   Image     Comment:   External Document

## 2010-02-19 NOTE — Letter (Signed)
Summary: [facility]  [facility]   Imported By: Marylou Mccoy 12/17/2009 15:08:22  _____________________________________________________________________  External Attachment:    Type:   Image     Comment:   External Document

## 2010-02-19 NOTE — Medication Information (Signed)
Summary: rov/tm  Anticoagulant Therapy  Managed by: Cloyde Reams, RN, BSN Referring MD: Tora Kindred MD: Shirlee Latch MD, Dalton Indication 1: Atrial Fibrillation Lab Used: [facility] [place] Site: [address] INR POC 2.2 INR RANGE 2.0-3.0  Dietary changes: yes       Details: Maybe eating less vit K intake  Health status changes: no    Bleeding/hemorrhagic complications: yes       Details: Incr bleeding from scratch.  Recent/future hospitalizations: yes       Details: Pt had pacer inserted 01/07/10.  Any changes in medication regimen? no    Recent/future dental: no  Any missed doses?: no       Is patient compliant with meds? yes       Allergies: 1)  ! Penicillin  Anticoagulation Management History:      The patient is taking warfarin and comes in today for a routine follow up visit.  Positive risk factors for bleeding include an age of 75 years or older.  The bleeding index is 'intermediate risk'.  Positive CHADS2 values include History of HTN and Age > 52 years old.  His last INR was 2.2 ratio.  Anticoagulation responsible provider: Shirlee Latch MD, [place].  INR POC: 2.2.  Cuvette Lot#: [id number].  Exp: 02/2011.    Anticoagulation Management Assessment/Plan:      The patient's current anticoagulation dose is Coumadin 5 mg tabs: Take as directed.  The target INR is 2.0-3.0.  The next INR is due 02/19/2010.  Anticoagulation instructions were given to patient.  Results were reviewed/authorized by Cloyde Reams, RN, BSN.  He was notified by Cloyde Reams RN.         Prior Anticoagulation Instructions: INR 2  Continue taking a half tablet on tuesday and thursday. And 1 tablet all other days. Recheck in 4 weeks.   Current Anticoagulation Instructions: INR 2.2  Continue on same dosage 1 tablet daily except 1/2 tablet on Tuesdays and Thursdays.  Recheck in 4 weeks.

## 2010-02-19 NOTE — Procedures (Signed)
Summary: wound check.[email or web address]   Current Medications (verified): 1)  Coumadin 5 Mg Tabs (Warfarin Sodium) .... Take As Directed 2)  Maxzide-25 37.5-25 Mg Tabs (Triamterene-Hctz) .... Take 1/2 Tablet Daily 3)  Furosemide 40 Mg Tabs (Furosemide) .... As Needed 4)  Clonidine Hcl 0.2 Mg Tabs (Clonidine Hcl) .... Take One Tablet By Mouth Once A Day 5)  Guanabenz Acetate 4 Mg Tabs (Guanabenz Acetate) .... Take 1 Tablet By Mouth Once A Day 6)  Nitrostat 0.4 Mg Subl (Nitroglycerin) .Marland Kitchen.. 1 Tablet Under Tongue At Onset of Chest Pain; You May Repeat Every 5 Minutes For Up To 3 Doses. 7)  Lanoxin 0.25 Mg Tabs (Digoxin) .... Take 1 1/2 Tablets Daily 8)  Metoprolol Succinate 50 Mg Xr24h-Tab (Metoprolol Succinate) .... Take 1 Tablet By Mouth Once A Day 9)  Clindamycin Hcl 150 Mg Caps (Clindamycin Hcl) .... Take 4 Cap One Hour Before Dentist Appoint. 10)  Actos 15 Mg Tabs (Pioglitazone Hcl) .... Take 1 Tablet By Mouth Once A Day 11)  Glipizide Xl 5 Mg Xr24h-Tab (Glipizide) .... Take 2 Tablets By Mouth Once Daily 12)  Lipitor 40 Mg Tabs (Atorvastatin Calcium) .... Take  1/2 Tablet Daily 13)  Byetta 5 Mcg Pen 5 Mcg/0.22ml Soln (Exenatide) .... Inject 5mg  Two Times A Day 14)  Tylenol Extra Strength 500 Mg Tabs (Acetaminophen) .... As Needed 15)  Maalox 600 Mg Chew (Calcium Carbonate Antacid) .... As Needed 16)  Multivitamins  Tabs (Multiple Vitamin) .... Take 1 Tablet By Mouth Once A Day  Allergies (verified): 1)  ! Penicillin  PPM Specifications Following MD:  Lewayne Bunting, MD     PPM Vendor:  St Jude     PPM Model Number:  [id number]     PPM Serial Number:  [id number] PPM DOI:  08/22/2002     PPM Implanting MD:  NOT IMPLANTED BY [place]  Lead 1    Location: RA     DOI: 08/22/2002     Model #: 1488TC     Serial #: [id number]     Status: active Lead 2    Location: RV     DOI: 08/22/2002     Model #: 1336T     Serial #: [id number]     Status: active  Magnet Response Rate:  BOL 98.6 ERI 86.3    PPM Follow Up Battery  Voltage:  3.07 V     Battery Est. Longevity:  13.4-14.3 yrs     Pacer Dependent:  Yes     Right Ventricle  Amplitude: 10.8 mV, Impedance: 580 ohms, Threshold: 0.625 V at 0.4 msec  Episodes MS Episodes:  0     Coumadin:  Yes Ventricular High Rate:  0     Ventricular Pacing:  >99%  Parameters Mode:  VVIR     Lower Rate Limit:  70     Upper Rate Limit:  120 Next Cardiology Appt Due:  04/09/2010 Tech Comments:  WOUND CHECK---STERI STRIPS REMOVED.  NO REDNESS OR SWELLING AT SITE.  NORMAL DEVICE FUNCTION.  NO CHANGES MADE. ROV 04-09-10 @ 1445 W/GT.  PT IS ENROLLED IN [place] AND WILL START REMOTE CHECKS AFTER OV W/GT. Vella Kohler  January 22, 2010 11:17 AM

## 2010-02-19 NOTE — Cardiovascular Report (Signed)
Summary: Office Visit   Office Visit   Imported By: Roderic Ovens 02/06/2010 16:18:11  _____________________________________________________________________  External Attachment:    Type:   Image     Comment:   External Document

## 2010-02-20 ENCOUNTER — Encounter (HOSPITAL_COMMUNITY): Payer: Self-pay

## 2010-02-23 ENCOUNTER — Encounter (HOSPITAL_COMMUNITY): Payer: Self-pay

## 2010-02-25 ENCOUNTER — Encounter (HOSPITAL_COMMUNITY): Payer: Self-pay

## 2010-02-25 NOTE — Medication Information (Signed)
Summary: rov/ejm  Anticoagulant Therapy  Managed by: Georgina Pillion, PharmD Referring MD: Tora Kindred MD: Jens Som MD, Arlys John Indication 1: Atrial Fibrillation Lab Used: [facility] [place] Site: [address] INR POC 2.0 INR RANGE 2.0-3.0  Dietary changes: no    Health status changes: no    Bleeding/hemorrhagic complications: no    Recent/future hospitalizations: no    Any changes in medication regimen? no    Recent/future dental: no  Any missed doses?: no       Is patient compliant with meds? yes       Allergies: 1)  ! Penicillin  Anticoagulation Management History:      Positive risk factors for bleeding include an age of 75 years or older.  The bleeding index is 'intermediate risk'.  Positive CHADS2 values include History of HTN and Age > 31 years old.  His last INR was 2.2 ratio.  Anticoagulation responsible provider: Jens Som MD, Arlys John.  INR POC: 2.0.  Exp: 02/2011.    Anticoagulation Management Assessment/Plan:      The patient's current anticoagulation dose is Coumadin 5 mg tabs: Take as directed.  The target INR is 2.0-3.0.  The next INR is due 03/19/2010.  Anticoagulation instructions were given to patient.  Results were reviewed/authorized by Georgina Pillion, PharmD.         Prior Anticoagulation Instructions: INR 2.2  Continue on same dosage 1 tablet daily except 1/2 tablet on Tuesdays and Thursdays.  Recheck in 4 weeks.    Current Anticoagulation Instructions: Continue taking your coumadin as scheduled with 1 tablet (5 mg) daily except for 1/2 tablet (2.5 mg) on Tuesdays and Thursdays  2.0

## 2010-02-26 NOTE — Op Note (Signed)
  NAME:  Oscar Bruce, Oscar Bruce NO.:  [id number]  MEDICAL RECORD NO.:  [id number]          PATIENT TYPE:  OIB  LOCATION:  2899                         FACILITY:  [facility]  PHYSICIAN:  Doylene Canning. Ladona Ridgel, MD    DATE OF BIRTH:  [birth year]  DATE OF PROCEDURE:  01/07/2010 DATE OF DISCHARGE:  01/07/2010                              OPERATIVE REPORT   PROCEDURE PERFORMED:  Removal of previous implanted dual-chamber pacemaker, insertion of a single chamber pacemaker in a patient with complete heart block and atrial fibrillation.  OPERATOR:  Doylene Canning. Ladona Ridgel, MD  INTRODUCTION:  The patient is an 77 year old male with a history of symptomatic bradycardia and atrial fibrillation.  He is status post pacemaker insertion and has reached elective replacement indication and is now referred for removal of his old pacemaker which has reached ERI and insertion of a new device.  PROCEDURE IN DETAIL:  After informed consent was obtained, the patient was taken to the diagnostic EP lab in a fasting state.  After usual preparation and draping, intravenous fentanyl and midazolam was given for sedation.  Lidocaine 30 mL was infiltrated into the right infraclavicular region and a 5-cm incision was carried out above the old pacemaker incision site and electrocautery was utilized to dissect down to the pacemaker pocket.  The pacemaker was removed with minimal difficulty.  There were some adhesions that had to be removed from the leads which was accomplished without particular difficulty.  The old pacemaker was removed from the old pacing leads.  The patient did have a ventricular escape between 25 and 30 beats per minute.  The leads were evaluated.  The atrial lead was capped because of his chronic AFib.  The ventricular lead was evaluated and the R-waves were 12.  The threshold was a volt to 0.4 milliseconds and the impedance was 564 ohms.  With these satisfactory parameters, the new [employer] Jude Acc[employer]  SR single chamber pacemaker, serial number [id number] was connected to the old ventricular pacing lead and placed back in the subcutaneous pocket.  It should be noted that the new pacemaker was larger than the old pacemaker despite it being a single chamber device and the pocket was revised to accommodate the larger bodied pacemaker.  At this point, additional antibiotic irrigation was utilized to irrigate the pocket and the incision was closed with 2-0 and 3-0 Vicryl.  Benzoin and Steri-Strips were painted on the skin, a pressure dressing was applied, and the patient was returned to his room in satisfactory condition.  COMPLICATIONS:  There were no immediate procedure complications.  RESULTS:  This demonstrates successful removal of a previously implanted dual-chamber pacemaker secondary to it having reached elective replacement indication and insertion of a new single chamber pacemaker. The atrial lead was capped.  There was a pocket revision carried out secondary to the larger size of the new device.     Doylene Canning. Ladona Ridgel, MD GWT/MEDQ  D:  01/07/2010  T:  01/07/2010  Job:  [id number]  Electronically Signed by Lewayne Bunting MD on 02/26/2010 05:03:39 PM

## 2010-02-27 ENCOUNTER — Encounter (HOSPITAL_COMMUNITY): Payer: Self-pay

## 2010-03-02 ENCOUNTER — Encounter (HOSPITAL_COMMUNITY): Payer: Self-pay

## 2010-03-02 ENCOUNTER — Encounter: Payer: Self-pay | Admitting: *Deleted

## 2010-03-02 DIAGNOSIS — I4891 Unspecified atrial fibrillation: Secondary | ICD-10-CM

## 2010-03-04 ENCOUNTER — Encounter (HOSPITAL_COMMUNITY): Payer: Self-pay

## 2010-03-06 ENCOUNTER — Encounter (HOSPITAL_COMMUNITY): Payer: Self-pay

## 2010-03-09 ENCOUNTER — Encounter (HOSPITAL_COMMUNITY): Payer: Self-pay

## 2010-03-11 ENCOUNTER — Encounter (HOSPITAL_COMMUNITY): Payer: Self-pay

## 2010-03-13 ENCOUNTER — Encounter (HOSPITAL_COMMUNITY): Payer: Self-pay

## 2010-03-16 ENCOUNTER — Encounter (HOSPITAL_COMMUNITY): Payer: Self-pay

## 2010-03-18 ENCOUNTER — Encounter (HOSPITAL_COMMUNITY): Payer: Self-pay

## 2010-03-19 ENCOUNTER — Encounter: Payer: Self-pay | Admitting: Cardiology

## 2010-03-19 ENCOUNTER — Encounter (INDEPENDENT_AMBULATORY_CARE_PROVIDER_SITE_OTHER): Payer: 59

## 2010-03-19 DIAGNOSIS — Z7901 Long term (current) use of anticoagulants: Secondary | ICD-10-CM

## 2010-03-19 DIAGNOSIS — I4891 Unspecified atrial fibrillation: Secondary | ICD-10-CM

## 2010-03-20 ENCOUNTER — Encounter (HOSPITAL_COMMUNITY): Payer: Self-pay | Attending: Internal Medicine

## 2010-03-20 DIAGNOSIS — Z5189 Encounter for other specified aftercare: Secondary | ICD-10-CM | POA: Insufficient documentation

## 2010-03-20 DIAGNOSIS — E7800 Pure hypercholesterolemia, unspecified: Secondary | ICD-10-CM | POA: Insufficient documentation

## 2010-03-20 DIAGNOSIS — I2510 Atherosclerotic heart disease of native coronary artery without angina pectoris: Secondary | ICD-10-CM | POA: Insufficient documentation

## 2010-03-20 DIAGNOSIS — E119 Type 2 diabetes mellitus without complications: Secondary | ICD-10-CM | POA: Insufficient documentation

## 2010-03-20 DIAGNOSIS — Z951 Presence of aortocoronary bypass graft: Secondary | ICD-10-CM | POA: Insufficient documentation

## 2010-03-20 DIAGNOSIS — I10 Essential (primary) hypertension: Secondary | ICD-10-CM | POA: Insufficient documentation

## 2010-03-20 DIAGNOSIS — Z8249 Family history of ischemic heart disease and other diseases of the circulatory system: Secondary | ICD-10-CM | POA: Insufficient documentation

## 2010-03-23 ENCOUNTER — Encounter (HOSPITAL_COMMUNITY): Payer: Self-pay

## 2010-03-25 ENCOUNTER — Encounter (HOSPITAL_COMMUNITY): Payer: Self-pay

## 2010-03-26 NOTE — Medication Information (Signed)
Summary: rov/ewj  Anticoagulant Therapy  Managed by: Cloyde Reams, RN, BSN Referring MD: Tora Kindred MD: Patty Sermons Indication 1: Atrial Fibrillation Lab Used: [facility] [name] Site: [address] INR POC 2.0 INR RANGE 2.0-3.0   Health status changes: yes       Details: BP fluctuates at cardiac rehab  Bleeding/hemorrhagic complications: no    Recent/future hospitalizations: no    Any changes in medication regimen? no    Recent/future dental: no  Any missed doses?: no       Is patient compliant with meds? yes       Allergies: 1)  ! Penicillin  Anticoagulation Management History:      The patient is taking warfarin and comes in today for a routine follow up visit.  Positive risk factors for bleeding include an age of 75 years or older.  The bleeding index is 'intermediate risk'.  Positive CHADS2 values include History of HTN and Age > 64 years old.  His last INR was 2.2 ratio.  Anticoagulation responsible provider: Zorion Nims.  INR POC: 2.0.  Cuvette Lot#: [id number].  Exp: 02/2011.    Anticoagulation Management Assessment/Plan:      The patient's current anticoagulation dose is Coumadin 5 mg tabs: Take as directed.  The target INR is 2.0-3.0.  The next INR is due 04/16/2010.  Anticoagulation instructions were given to patient.  Results were reviewed/authorized by Cloyde Reams, RN, BSN.  He was notified by Cloyde Reams RN.         Prior Anticoagulation Instructions: Continue taking your coumadin as scheduled with 1 tablet (5 mg) daily except for 1/2 tablet (2.5 mg) on Tuesdays and Thursdays  2.0  Current Anticoagulation Instructions: INR 2.0  Take 1 tablet today, then resume same dosage 1 tablet daily except 1/2 taqblet on Tuesdays and Thursdays.  Recheck in 4 weeks.

## 2010-03-27 ENCOUNTER — Encounter (HOSPITAL_COMMUNITY): Payer: Self-pay

## 2010-03-28 ENCOUNTER — Encounter: Payer: Self-pay | Admitting: Cardiology

## 2010-03-30 ENCOUNTER — Encounter (HOSPITAL_COMMUNITY): Payer: Self-pay

## 2010-03-30 LAB — PROTIME-INR
INR: 1.97 — ABNORMAL HIGH (ref 0.00–1.49)
Prothrombin Time: 22.6 seconds — ABNORMAL HIGH (ref 11.6–15.2)

## 2010-03-30 LAB — SURGICAL PCR SCREEN
MRSA, PCR: NEGATIVE
Staphylococcus aureus: NEGATIVE

## 2010-03-30 LAB — GLUCOSE, CAPILLARY: Glucose-Capillary: 128 mg/dL — ABNORMAL HIGH (ref 70–99)

## 2010-04-01 ENCOUNTER — Encounter (HOSPITAL_COMMUNITY): Payer: Self-pay

## 2010-04-03 ENCOUNTER — Encounter (HOSPITAL_COMMUNITY): Payer: Self-pay

## 2010-04-06 ENCOUNTER — Encounter (HOSPITAL_COMMUNITY): Payer: Self-pay

## 2010-04-07 ENCOUNTER — Encounter: Payer: Self-pay | Admitting: Internal Medicine

## 2010-04-08 ENCOUNTER — Encounter (HOSPITAL_COMMUNITY): Payer: Self-pay

## 2010-04-09 ENCOUNTER — Ambulatory Visit (INDEPENDENT_AMBULATORY_CARE_PROVIDER_SITE_OTHER): Payer: 59 | Admitting: Internal Medicine

## 2010-04-09 ENCOUNTER — Encounter: Payer: Self-pay | Admitting: Internal Medicine

## 2010-04-09 DIAGNOSIS — Z950 Presence of cardiac pacemaker: Secondary | ICD-10-CM

## 2010-04-09 DIAGNOSIS — I4891 Unspecified atrial fibrillation: Secondary | ICD-10-CM

## 2010-04-09 DIAGNOSIS — I10 Essential (primary) hypertension: Secondary | ICD-10-CM

## 2010-04-09 DIAGNOSIS — I442 Atrioventricular block, complete: Secondary | ICD-10-CM

## 2010-04-09 NOTE — Patient Instructions (Signed)
Patient to follow up in December with Dr Verdis Frederickson receive a phone call for appoinment  Remote monitoring is used to monitor your Pacemaker of ICD from home. This monitoring reduces the number of office visits required to check your device to one time per year. It allows [place] to keep an eye on the functioning of your device to ensure it is working properly. You are scheduled for a device check from home on 07/09/2010. You may send your transmission at any time that day. If you have a wireless device, the transmission will be sent automatically. After your physician reviews your transmission, you will receive a postcard with your next transmission date.

## 2010-04-09 NOTE — Assessment & Plan Note (Signed)
His device is working normally. Will recheck in several months. 

## 2010-04-09 NOTE — Assessment & Plan Note (Signed)
His blood pressure appears to be well controlled. He has been asked to reduce his sodium intake.

## 2010-04-09 NOTE — Assessment & Plan Note (Addendum)
His ventricular rate appears to be well controlled. Continue current meds. I have asked him to reschedule his coumadin clinic visit as he will hold his coumadin prior to colonoscopy.

## 2010-04-09 NOTE — Progress Notes (Deleted)
   Patient ID: Oscar Bruce, male    DOB: [birth year], 77 y.o.   MRN: [id number]  HPI    Review of Systems    Physical Exam

## 2010-04-09 NOTE — Progress Notes (Signed)
HPI [name] returns today for followup. He is a pleasant elderly man with a h/o symptomatic bradycardia, HTN, chronic venous insufficiency and diastolic heart failure. He is s/p PPM. No syncope. His main complaint is right lower extremity edema. No c/p or sob.  Allergies  Allergen Reactions  . Penicillins      Current Outpatient Prescriptions  Medication Sig Dispense Refill  . acetaminophen (TYLENOL) 500 MG chewable tablet Chew 500 mg by mouth every 6 (six) hours as needed.        Marland Kitchen atorvastatin (LIPITOR) 40 MG tablet Take 40 mg by mouth. 1/2 tab daily       . clindamycin (CLEOCIN) 150 MG capsule Take 150 mg by mouth 4 (four) times daily. Before dental appt       . cloNIDine (CATAPRES) 0.2 MG tablet Take 0.2 mg by mouth. 1 tab qd        . digoxin (LANOXIN) 0.25 MG tablet 250 mcg. Take 1 1/2 tab qd       . Exenatide (BYETTA 5 MCG PEN [place]) Inject into the skin. Inject 5mg  two times a day       . furosemide (LASIX) 40 MG tablet Take 40 mg by mouth daily as needed.       Marland Kitchen glipiZIDE (GLUCOTROL) 5 MG tablet Take 5 mg by mouth 2 (two) times daily before a meal.        . guanabenz (WYTENSIN) 4 MG tablet Take one tab qd       . metoprolol (TOPROL-XL) 50 MG 24 hr tablet Take 50 mg by mouth daily.        . Multiple Vitamin (MULTIVITAMIN) tablet Take 1 tablet by mouth daily.        . nitroGLYCERIN (NITROSTAT) 0.4 MG SL tablet Place 0.4 mg under the tongue every 5 (five) minutes as needed.        . pioglitazone (ACTOS) 15 MG tablet Take 15 mg by mouth daily.        . ramipril (ALTACE) 5 MG capsule Take 5 mg by mouth daily.        . Simethicone (MAALOX ANTI-GAS PO) Take by mouth. prn       . triamterene-hydrochlorothiazide (MAXZIDE-25) 37.5-25 MG per tablet Take by mouth. Take 1/2 tab qd        . warfarin (COUMADIN) 5 MG tablet Take by mouth as directed.           Past Medical History  Diagnosis Date  . A-fib   . Hypertension   . CAD (coronary artery disease)     ROS:   All systems  reviewed and negative except as noted in the HPI.   No past surgical history on file.   No family history on file.   History   Social History  . Marital Status: Widowed    Spouse Name: N/A    Number of Children: N/A  . Years of Education: N/A   Occupational History  . Not on file.   Social History Main Topics  . Smoking status: Never Smoker   . Smokeless tobacco: Not on file  . Alcohol Use: Not on file  . Drug Use: No  . Sexually Active: Not on file   Other Topics Concern  . Not on file   Social History Narrative  . No narrative on file     BP 125/65  Pulse 63  Resp 18  Ht 5\' 9"  (1.753 m)  Wt 176 lb (79.833 kg)  BMI 25.99  kg/m2  Physical Exam:  Well appearing NAD HEENT: Unremarkable Neck:  No JVD, no thyromegally Lymphatics:  No adenopathy Back:  No CVA tenderness Lungs:  Clear HEART:  Irregular rate rhythm, no murmurs, no rubs, no clicks Abd:  Flat, positive bowel sounds, no organomegally, no rebound, no guarding Ext:  2 plus pulses, 3+ edema right leg, trace edema left leg, no cyanosis, no clubbing Skin:  No rashes no nodules Neuro:  CN II through XII intact, motor grossly intact  DEVICE  Normal device function.  See PaceArt for details.   Assess/Plan:

## 2010-04-10 ENCOUNTER — Encounter (HOSPITAL_COMMUNITY): Payer: Self-pay

## 2010-04-13 ENCOUNTER — Encounter (HOSPITAL_COMMUNITY): Payer: Self-pay

## 2010-04-15 ENCOUNTER — Encounter (HOSPITAL_COMMUNITY): Payer: Self-pay

## 2010-04-16 ENCOUNTER — Encounter: Payer: 59 | Admitting: *Deleted

## 2010-04-17 ENCOUNTER — Encounter (HOSPITAL_COMMUNITY): Payer: Self-pay

## 2010-04-20 ENCOUNTER — Encounter (HOSPITAL_COMMUNITY): Payer: Self-pay | Attending: Internal Medicine

## 2010-04-20 DIAGNOSIS — Z951 Presence of aortocoronary bypass graft: Secondary | ICD-10-CM | POA: Insufficient documentation

## 2010-04-20 DIAGNOSIS — E119 Type 2 diabetes mellitus without complications: Secondary | ICD-10-CM | POA: Insufficient documentation

## 2010-04-20 DIAGNOSIS — Z8249 Family history of ischemic heart disease and other diseases of the circulatory system: Secondary | ICD-10-CM | POA: Insufficient documentation

## 2010-04-20 DIAGNOSIS — I10 Essential (primary) hypertension: Secondary | ICD-10-CM | POA: Insufficient documentation

## 2010-04-20 DIAGNOSIS — I2510 Atherosclerotic heart disease of native coronary artery without angina pectoris: Secondary | ICD-10-CM | POA: Insufficient documentation

## 2010-04-20 DIAGNOSIS — Z5189 Encounter for other specified aftercare: Secondary | ICD-10-CM | POA: Insufficient documentation

## 2010-04-20 DIAGNOSIS — E7800 Pure hypercholesterolemia, unspecified: Secondary | ICD-10-CM | POA: Insufficient documentation

## 2010-04-22 ENCOUNTER — Encounter (HOSPITAL_COMMUNITY): Payer: Self-pay

## 2010-04-24 ENCOUNTER — Encounter (HOSPITAL_COMMUNITY): Payer: Self-pay

## 2010-04-27 ENCOUNTER — Encounter (HOSPITAL_COMMUNITY): Payer: Self-pay

## 2010-04-29 ENCOUNTER — Encounter (HOSPITAL_COMMUNITY): Payer: Self-pay

## 2010-05-01 ENCOUNTER — Encounter (HOSPITAL_COMMUNITY): Payer: Self-pay

## 2010-05-04 ENCOUNTER — Encounter (HOSPITAL_COMMUNITY): Payer: Self-pay | Attending: Internal Medicine

## 2010-05-04 ENCOUNTER — Encounter (HOSPITAL_COMMUNITY): Payer: Self-pay

## 2010-05-05 ENCOUNTER — Ambulatory Visit (INDEPENDENT_AMBULATORY_CARE_PROVIDER_SITE_OTHER): Payer: MEDICARE | Admitting: *Deleted

## 2010-05-05 DIAGNOSIS — Z7901 Long term (current) use of anticoagulants: Secondary | ICD-10-CM | POA: Insufficient documentation

## 2010-05-05 DIAGNOSIS — I4891 Unspecified atrial fibrillation: Secondary | ICD-10-CM

## 2010-05-06 ENCOUNTER — Encounter (HOSPITAL_COMMUNITY): Payer: Self-pay

## 2010-05-08 ENCOUNTER — Encounter (HOSPITAL_COMMUNITY): Payer: Self-pay

## 2010-05-11 ENCOUNTER — Encounter (HOSPITAL_COMMUNITY): Payer: Self-pay

## 2010-05-13 ENCOUNTER — Encounter (HOSPITAL_COMMUNITY): Payer: Self-pay

## 2010-05-15 ENCOUNTER — Encounter (HOSPITAL_COMMUNITY): Payer: Self-pay

## 2010-05-18 ENCOUNTER — Encounter (HOSPITAL_COMMUNITY): Payer: Self-pay

## 2010-05-20 ENCOUNTER — Encounter (HOSPITAL_COMMUNITY): Payer: Self-pay | Attending: Internal Medicine

## 2010-05-20 DIAGNOSIS — E7800 Pure hypercholesterolemia, unspecified: Secondary | ICD-10-CM | POA: Insufficient documentation

## 2010-05-20 DIAGNOSIS — I2510 Atherosclerotic heart disease of native coronary artery without angina pectoris: Secondary | ICD-10-CM | POA: Insufficient documentation

## 2010-05-20 DIAGNOSIS — Z951 Presence of aortocoronary bypass graft: Secondary | ICD-10-CM | POA: Insufficient documentation

## 2010-05-20 DIAGNOSIS — Z8249 Family history of ischemic heart disease and other diseases of the circulatory system: Secondary | ICD-10-CM | POA: Insufficient documentation

## 2010-05-20 DIAGNOSIS — I10 Essential (primary) hypertension: Secondary | ICD-10-CM | POA: Insufficient documentation

## 2010-05-20 DIAGNOSIS — E119 Type 2 diabetes mellitus without complications: Secondary | ICD-10-CM | POA: Insufficient documentation

## 2010-05-20 DIAGNOSIS — Z5189 Encounter for other specified aftercare: Secondary | ICD-10-CM | POA: Insufficient documentation

## 2010-05-22 ENCOUNTER — Encounter (HOSPITAL_COMMUNITY): Payer: Self-pay

## 2010-05-25 ENCOUNTER — Encounter (HOSPITAL_COMMUNITY): Payer: Self-pay

## 2010-05-27 ENCOUNTER — Encounter (HOSPITAL_COMMUNITY): Payer: Self-pay

## 2010-05-29 ENCOUNTER — Encounter (HOSPITAL_COMMUNITY): Payer: Self-pay

## 2010-06-01 ENCOUNTER — Encounter (HOSPITAL_COMMUNITY): Payer: Self-pay

## 2010-06-02 ENCOUNTER — Ambulatory Visit (INDEPENDENT_AMBULATORY_CARE_PROVIDER_SITE_OTHER): Payer: MEDICARE | Admitting: *Deleted

## 2010-06-02 DIAGNOSIS — I4891 Unspecified atrial fibrillation: Secondary | ICD-10-CM

## 2010-06-02 LAB — POCT INR: INR: 2.2

## 2010-06-03 ENCOUNTER — Encounter (HOSPITAL_COMMUNITY): Payer: Self-pay

## 2010-06-05 ENCOUNTER — Encounter (HOSPITAL_COMMUNITY): Payer: Self-pay

## 2010-06-05 NOTE — Discharge Summary (Signed)
[place]. [facility]  Patient:    Oscar Bruce, Oscar Bruce                       MRN: [id number] Adm. Date:  [id number] Disc. Date: [id number] Attending:  Mikey Bussing Dictator:   Loura Pardon, P.A. CC:         Mikey Bussing, M.D.  John R. Aleen Campi, M.D.  Soyla Murphy. Renne Crigler, M.D.   Discharge Summary  DATE OF BIRTH:  01-09-1925.  ADDENDUM:  He will also restart the "Prove-It" study and he may go home on his prior dosage of hydrochlorothiazide with a potassium supplement. DD:  02/18/00 TD:  02/18/00 Job: [id number]

## 2010-06-05 NOTE — Cardiovascular Report (Signed)
NAME:  GER, RINGENBERG NO.:  [id number]   MEDICAL RECORD NO.:  [id number]                   PATIENT TYPE:  INP   LOCATION:  2902                                 FACILITY:  [facility]   PHYSICIAN:  Aram Candela. Tysinger, M.D.              DATE OF BIRTH:  [birth year]   DATE OF PROCEDURE:  08/22/2002  DATE OF DISCHARGE:                              CARDIAC CATHETERIZATION   REFERRING PHYSICIAN:  Soyla Murphy. Renne Crigler, M.D.   PROCEDURE:  Insertion of dual chamber permanent transvenous pacemaker.   INDICATIONS FOR PROCEDURE:  A 77 year old male status post coronary artery  bypass graft surgery with tachybrady syndrome, also with significant  bradycardia due to symptomatic second degree AV block with 2:1 conduction  and heart rates of 30 causing marked dizziness and weakness.   PROCEDURE:  After signing an informed consent the patient was premedicated  with 5 mg of Valium by mouth and brought to the electrophysiology laboratory  on the sixth floor of [facility].  His right anterior chest and  base of neck were prepped and draped in a sterile fashion and a right  transverse subclavicular plane was anesthetized locally with 1% lidocaine.  An incision was made in this anesthetized plane with the incision being  deepened into the facial layer overlying the pectoralis muscle.  Pocket was  then formed in this fascial layer for later insertion of the pulse  generator.  A #8 Cook introducer sheath was inserted percutaneously into the  right subclavian vein and a Pacesetter bipolar ventricular lead was selected  model #1336T serial [id number].  After proper preparation it was inserted  through the 8-French sheath and advanced to the superior vena cava.  We then  inserted a 7-French Cook introducer sheath into the right subclavian vein  and a Pacesetter bipolar active fixation screw-in lead was selected model  #1488TC serial [id number].  After proper preparation this was  inserted through  the 7-French sheath and advanced to the superior vena cava.  Both sheaths  were removed in the usual fashion.  A ventricular lead was then advanced  into the right atrium and right ventricle where the tip was positioned in  the apex of the right ventricle.  Very good pacing parameters were obtained  with an R-wave sensitivity of 12 millivolts, resistance of 766 ohms, and a  minimum __________ threshold of 0.5 volts utilizing 0.7 milliamps of  current.  After obtaining these ventricular pacing parameters, the active  fixation lead was then advanced into the right atrium and a J-wire was  inserted into this lead and the tip was positioned anteriorly in the vestige  of the right atrial appendage.  After screwing in the lead we found very  good pacing parameters with a P-wave sensitivity of 1.4 millivolts, a  resistance of 549 ohms, and a minimum voltage threshold at 0.5 volts  utilizing 1.0 milliamps of current.  After obtaining these pacing parameters  the leads were secured properly at their insertion site.  The wound was  lavaged profusely with kanamycin solution.  We then selected a dual chamber  pulse generator made by [facility] model identity ADXDR model [phone]  serial [id number].  After properly analyzing the pulse generator it was  attached to the pacing electrodes in the usual fashion.  The pulse generator  was then placed within the previously formed pocket and the wound was closed  in layers using 2-0 Dexon.  Final skin closure was obtained with a cutaneous  layer of Steri-Strips.  The patient tolerated the procedure well and no  complications were noted.  At the end of the procedure a sterile bulky  dressing was applied to the wound and he was returned to his room in  satisfactory condition.    MEDICATIONS GIVEN:  None.   The pacemaker is noted to be functioning normally in the DDD mode.                                               John R. Aleen Campi,  M.D.    JRT/MEDQ  D:  08/22/2002  T:  08/23/2002  Job:  [id number]   cc:   Soyla Murphy. Renne Crigler, M.D.  [address]  [place]  [place] [address]  Fax: [phone]   Cath Lab

## 2010-06-05 NOTE — Op Note (Signed)
NAME:  Oscar Bruce, Oscar Bruce NO.:  [id number]   MEDICAL RECORD NO.:  [id number]          PATIENT TYPE:  AMB   LOCATION:  ENDO                         FACILITY:  [facility]   PHYSICIAN:  Anselmo Rod, M.D.  DATE OF BIRTH:  [birth year]   DATE OF PROCEDURE:  12/18/2004  DATE OF DISCHARGE:                                 OPERATIVE REPORT   PROCEDURE PERFORMED:  Esophagogastroduodenoscopy with biopsies.   ENDOSCOPIST:  Anselmo Rod, M.D.   INSTRUMENT USED:  Olympus video panendoscope   INDICATIONS FOR PROCEDURE:  A 76 year old white male with a history of black  stools.  Patient is on Coumadin for atrial fibrillation and has held the  Coumadin five days prior to the procedure.  Rule out peptic ulcer disease,  esophagitis, gastritis, etc.   PREPROCEDURE PREPARATION:  Informed consent was procured from the patient.  The patient fasted for eight hours prior to the procedure.   PREPROCEDURE PHYSICAL EXAMINATION:  VITAL SIGNS:  Stable.  NECK:  Supple.  CHEST:  Clear to auscultation.  CARDIOVASCULAR:  S1 and S2 regular.  Patient had paced rhythm.  ABDOMEN:  Soft with normal bowel sounds.   DESCRIPTION OF PROCEDURE:  The patient was placed in left lateral decubitus  position, sedated with 50 mg of Demerol and 5 mg of Versed in slow  incremental doses.  Once the patient was adequately sedated and maintained  on low flow oxygen and continuous cardiac monitoring, the Olympus video  panendoscope was advanced through the mouthpiece over the tongue and into  the esophagus under direct vision.  The entire esophagus appeared normal and  was widely patent with no evidence of ring, stricture, masses, esophagitis  or Barrett's mucosa.  The scope was then advanced into the stomach.  An  ulcer was seen in the antrum with edematous margins.  There was a blackish  spot in the base of the ulcer but no visible vessel was identified.  There  were other erosions seen in the antrum as well  with significant edema.  Few  erosions were noted in the duodenal bulb.  Small bowel distal to the bulb up  to 60 cm appeared  normal.  There was no outlet obstruction.  The patient  tolerated the procedure well without complications.  Retroflexion in the  stomach revealed no evidence of hiatal hernia.  There was diffuse gastritis.  Biopsies were done to rule out presence of H. pylori by pathology.   IMPRESSION:  1.  Normal appearing esophagus.  2.  Diffuse gastritis, biopsies done for Helicobacter pylori.  3.  Antral ulcer with erosions.  4.  Erosions in duodenal bulb.  5.  Normal post bulbar area.   RECOMMENDATIONS:  1.  Await pathology results.  2.  Treat with antibiotics if H. pylori present on biopsies.  3.  Double dose PPIs.  4.  Hold all nonsteroidals and Coumadin for now [as discussed with Dr.      Adelene Idler PA].  5.  Repeat EGD in the next three months to document healing of the ulcer.  6.  Proceed with colonoscopy at  this time.  Further recommendation made      thereafter.      Anselmo Rod, M.D.  Electronically Signed     JNM/MEDQ  D:  12/18/2004  T:  12/18/2004  Job:  [id number]   cc:   Soyla Murphy. Renne Crigler, M.D.  Fax: [phone]   Antionette Char, MD  Fax: [phone]   Angelia Mould. Derrell Lolling, M.D.  [address]  [place]  [place] [address]   Samul Dada, M.D.  Fax: [phone]

## 2010-06-05 NOTE — Op Note (Signed)
[place]. [facility]  Patient:    Oscar Bruce, COOKSEY                         MRN: [id number] Proc. Date: 02/09/00 Adm. Date:  02/09/00 Attending:  Mikey Bussing, M.D. CC:         CVTS OFFICE  Aram Candela. Tysinger, M.D. [facility]   Operative Report  PROCEDURE:  Coronary artery bypass grafting x 5 (left internal mammary artery to LAD, saphenous vein graft to diagonal, saphenous vein graft to obtuse marginal, sequential saphenous vein graft to posterior descending and posterolateral branch of the right coronary artery.  PREOPERATIVE DIAGNOSIS:  Class IV unstable angina with severe 3 vessel disease.  POSTOPERATIVE DIAGNOSIS:  Class IV unstable angina with severe 3 vessel disease.  SURGEON:  Mikey Bussing, M.D.  ASSISTANT:  Sherrie George, P.A.-C. and Myrlene Broker, P.A.-C.  ANESTHESIA:  General.  INDICATIONS:  The patient is a 77 year old diabetic who presents with symptoms of unstable angina following a prior history of stenting and angioplasty of the LAD diagonal.  Recatheterization demonstrated a re-stenosis of the LAD diagonal as well as severe 3 vessel disease with fairly well preserved left ventricular function.  He was referred for surgical coronary revascularization.  Prior to the operation I discussed the indications and expected benefits of coronary bypass surgery with the patient and his family. I discussed the major aspects of the operation including the choice of conduit, the location of the surgical incisions, the use of general anesthesia and cardiopulmonary bypass and the expected postoperative recovery.  I reviewed the risks associated with the operation including the risks of MI, cerebrovascular accident, bleeding, infection, and death.  He understood the implications for surgery, the alternatives to surgery, and agreed to proceed with the operation as planned under informed consent.  OPERATIVE FINDINGS:  The  patients coronaries have severe diffuse disease and the LAD, right coronary system, and diagonal would not be vessels for re-do surgery.  The mammary artery was a good vessel with excellent flow and the vein was average to above average quality.  PROCEDURE:  The patient was brought to the operating room and placed supine on the operating room table where general anesthesia was induced under invasive hemodynamic monitoring.  The chest, abdomen and legs were prepped with Betadine and draped as a sterile food.  A median sternotomy was performed as the saphenous vein was harvested from the right leg.  The internal mammary artery was harvested as a pedicle graft from its origin at the subclavian vessels.  Heparin was administered and the ACT was documented as being therapeutic.  A cannula was placed an ascending aorta and right atrium.  The patient was cannulated and placed on bypass and cooled to 32 degrees.  The coronaries were identified and the mammary artery and vein grafts were prepared for the distal anastomosis.  A cardioplegia cannula was placed and the patient was cooled to 28 degrees. As the aortic cross-clamp was applied 700 cc of cold blood cardioplegia was delivered to the aorta group with immediate cardioplegic arrest and supple temperature dropping less than 14 degrees.  Topical iced saline slush was used to augment myocardial preservation and a pericardial insulator pad was used to protect the left phrenic nerve.  The distal coronary anastomoses were then performed.  The first distal anastomosis was to the first diagonal.  This was a 1.5 mm vessel with proximal 95% stenosis.  A  reverse saphenous vein was sewn end-to-side with a running 7-0 Prolene.  The second distal anastomosis was the to obtuse marginal which is a 1.8 mm vessel with a proximal 80% stenosis.  A reverse saphenous vein was sewn end-to-side to this intramyocardial coronary with running 7-0 Prolene with good  flow through the graft.  The third and fourth distal anastomosis consisted of a sequential vein to the posterior descending and the posterolateral.  A side branch of the vein was sewn first to the posterolateral which was a 1.2 mm vessel with a proximal 90% stenosis and a side-to-side anastomosis with a running 7-0 Prolene was constructed.  The distal aspect of the vein was then continued to the posterior descending very distally due to its diffuse disease.  The posterior descending was a 1.5 mm vessel with a proximal 90% stenoses and the end of the vein was sewn end-to-side with a running 7-0 Prolene and there was excellent flow through this graft.  Cardioplegia was redosed.  The fifth distal anastomosis was to the distal LAD which was also diffusely diseased consistent with a diabetic vascular pattern.  The left internal mammary artery pedicle was brought through an opening created in the left lateral pericardium and was brought down on the LAD and sewn end-to-side with a running 8-0 Prolene. There was excellent flow through the anastomosis with immediate rise in septal temperature after release of the pedicle clamp on the mammary artery.  The mammary pedicle was secured to the epicardium and the aortic crossclamp was removed.  The heart resumed a spontaneous rhythm.  A partial occluding clamp was placed on the ascending aorta and 3 proximal vein anastomoses were performed using a 4.0 mm punch and running 6-0 Prolene.  The partial clamp was removed and the vein grafts were perfused.  Each had excellent flow and hemostasis was documented at the proximal and distal sites.  The patient was rewarmed and reperfused and temporary pacing wires were applied.  When the patient reached 37 degrees the lungs were expanded and the ventilator was resumed.  The patient was weaned from cardiopulmonary bypass being AV sequentially paced with stable blood pressure and cardiac output.  Protamine was  administered and  the cannulas were removed.  The mediastinum was irrigated with warm antibiotic irrigation.  The leg incision was irrigated and closed in a standard fashion. The pericardium was loosely reapproximated.  Two mediastinal and left pleural chest tube were placed and brought out through separate incisions.  The sternum was reapproximated with interrupted steal wire and the pectoralis fascia and subcutaneous layers closed with a running Vicryl.  The skin was closed with a subcuticular and sterile dressings were applied.  Total cardiopulmonary bypass time was 128 minutes with an aortic crossclamp time of 60 minutes. DD:  02/09/00 TD:  02/09/00 Job: [id number]

## 2010-06-05 NOTE — H&P (Signed)
[place]. [facility]  Patient:    Oscar Bruce, Oscar Bruce                      MRN: [id number] Adm. Date:  03/23/99 Attending:  Jaclyn Prime. Lucas Mallow, M.D. Dictator:   Donzetta Matters, P.A.                         History and Physical  CHIEF COMPLAINT:  Chest pain.  HISTORY OF PRESENT ILLNESS:  This is a 77 year old male that states he has had n onset of left shoulder pain that has continued over the past week with discomfort with putting his shirt on and lifting with his left arm.  It has radiated down is left arm.  He has been treating it for an arthritis.  He does have previous history of a stent in the LAD in 1996.  Also, he is a non-insulin-dependent diabetic. Today, he had anterior chest pain.  This was after he had eaten a full meal at he [employer].  He had driven himself home and he called family members.  He has also noted increasing problems with controlling his blood pressure in the evening.  He was  then evaluated by the emergency room physician and felt, due to unstable angina, he should be admitted for further evaluation.  PAST MEDICAL HISTORY:  He has history of stent in the LAD in 1996.  He has hypertension.  He also has been treated for cancer of the colon in 1997, treated with radiation as well as chemotherapy.  He had cataract surgery in 2000.  Back  surgery February 2000.  He has been diabetic, non-insulin dependent.  His blood  sugars have been out of control since he has had some heavy steroid preparations recently.  FAMILY HISTORY:  Positive for coronary artery disease and diabetes.  SOCIAL HISTORY:  He just passed his drivers test yesterday.  Also, his wife had  died earlier this year; therefore, he lives alone.  He is a nonsmoker.  He denies any alcohol use.  CURRENT MEDICATIONS: 1. Tolbutamide 500 mg 2 tablets daily. 2. ______ 8 mg daily. 3. Imdur 30 mg daily. 4. Lanoxin 0.25 mg 1-1/2 tablets daily. 5. Norvasc 10 mg daily. 6.  Metroprolol 25 mg b.i.d. 7. Coated aspirin daily. 8. Vitamins 1 daily.  ALLERGIES:  He is sensitive to PENICILLIN.  He has not had any reactions to dye.  REVIEW OF SYSTEMS:  He is fully independent.  As stated above, he just passed his drivers license.  He is independent of all activities of daily living.  He does  manage his own affairs.  He lives in his own home that is two levels.  He does ot use any assistive devices.  He has had increasing problems with managing his hypertension over the past couple of weeks.  He denies any difficulty with breathing.  He has had some problems with dizziness but no loss of consciousness. No extremity weakness or numbness.  He has had some discomfort in his left shoulder, which he had attributed to arthritis.  He states his blood sugars have been difficult to contain due to use of steroids recently.  He denies any constipation or diarrhea.  No heartburn.  He has had some nocturia but no frequency or urgency.  He does have joint pain, as listed above.  Denies any problems with vision.  Does wear prescription lenses.  He denies any difficulty with sleeping.  He states he has had no change in his appetite.  He has had no significant weight loss or weight gain.  PHYSICAL EXAMINATION:  VITAL SIGNS:  On admission, temperature 96.7, heart rate 88, respiratory rate 20, blood pressure 180/90, height 5 feet 9 inches, estimated weight 204 pounds.  GENERAL:  This is a very healthy-appearing older male at the present time in no  acute distress.  He does give good history regarding his present situation.  He is well-dressed, well cared for.  HEENT:  Pupils are equal.  Extraocular movements intact.  He does have prescription lenses on.  He does have O2.  Mouth and pharynx are benign.  Teeth good repair.   NECK:  Supple.  There is no JVD, no bruits, no adenopathy, no thyromegaly.  CHEST:  Clear to auscultation.  No use of accessory muscles for  breathing. Normal adult male.  No cervical adenopathy, as noted.  HEART:  Regular rate and rhythm.  No obvious murmurs.  ABDOMEN:  Soft, flat, active bowel sounds, nontender.  He is nontender over the  bladder.  EXTREMITIES:  Good pulses at posterior tibial at 3+.  Full range of motion.  He has no discomfort on anterior joint capsule of the shoulder joint.  He does have good range of motion of the left joint and left shoulder.  SKIN:  Intact.  There is no sign of any rashes, cyanosis, or clubbing to fingertips.  LABORATORY DATA:  EKG does show ST and T wave changes.  Otherwise, labs are stable.  IMPRESSION: 1. Recurrent angina with known coronary artery disease. 2. Diabetes, non-insulin dependent.  PLAN:  He is admitted.  Nitroglycerin drip, as well as heparin drip, and to be evaluated in the a.m. for heart catheterization by Dr. Aleen Campi. DD:  03/24/99 TD:  03/24/99 Job: [id number]

## 2010-06-05 NOTE — Op Note (Signed)
NAME:  Oscar Bruce, Oscar Bruce NO.:  [id number]   MEDICAL RECORD NO.:  [id number]          PATIENT TYPE:  AMB   LOCATION:  ENDO                         FACILITY:  [facility]   PHYSICIAN:  Anselmo Rod, M.D.  DATE OF BIRTH:  1924/01/24   DATE OF PROCEDURE:  12/18/2004  DATE OF DISCHARGE:                                 OPERATIVE REPORT   PROCEDURE:  Colonoscopy with core biopsies x2.   ENDOSCOPIST:  Anselmo Rod, M.D.   INSTRUMENT USED:  Olympus video colonoscope.   INDICATIONS FOR PROCEDURE:  This 77 year old white male with a personal  history of colon cancer in 1998, status post right hemicolectomy, undergoing  a colonoscopy for guaiac-positive stools.  Rule out colonic polyps, masses,  etc.   PRE-PROCEDURE PREPARATION:  An informed consent was procured from the  patient.  The patient was fasted for eight hours prior to the procedure,  after being prepped with a bottle of magnesium citrate and a gallon of  GoLYTELY on the night prior to the procedure.  The risks and benefits of the  procedure, including a 10% miss rate of cancer and polyps were discussed  with the patient and with his wife.   PRE-PROCEDURE PHYSICAL EXAMINATION:  VITAL SIGNS:  Stable.  NECK:  Supple.  CHEST:  Clear to auscultation.  HEART:  S1 and S2 regular.  ABDOMEN:  Soft, with normal bowel sounds.   DESCRIPTION OF PROCEDURE:  The patient was placed in the left lateral  decubitus position and sedated with Demerol and Versed for the EGD.  No  additional sedation was used for the colonoscopy.  Once the patient was  adequately sedated and maintained on low-flow oxygen and continuous cardiac  monitoring, the Olympus video colonoscope was advanced from the rectum to  100 cm, to the anastomosis, which appeared healthy.  A small sessile polyp  was removed from 90 cm by core biopsy (core biopsies x2).  The rest of the  examination was unremarkable.  A couple of diverticula were seen in the  sigmoid  colon.  These were small and in the early stages of formation.  Retroflexion in the rectum reveals small internal hemorrhoids.   IMPRESSION:  1.  Small non-bleeding internal hemorrhoids.  2.  A few early sigmoid diverticula.  3.  Small polyp, removed by colon biopsy from 90 cm.   RECOMMENDATIONS:  1.  Await the pathology results.  2.  Avoid all non-steroidals, including Coumadin for now.  3.  Outpatient followup in the next 2 weeks, or earlier if need be.      Anselmo Rod, M.D.  Electronically Signed     JNM/MEDQ  D:  12/18/2004  T:  12/18/2004  Job:  [id number]   cc:   Soyla Murphy. Renne Crigler, M.D.  Fax: [phone]   Antionette Char, MD  Fax: [phone]   Angelia Mould. Derrell Lolling, M.D.  [address], [address]  [place]  [place] [address]   Samul Dada, M.D.  Fax: [phone]

## 2010-06-05 NOTE — Discharge Summary (Signed)
[place]  Patient:    Oscar Bruce, Oscar Bruce                       MRN: [id number] Adm. Date:  [id number] Disc. Date: [id number] Attending:  Silvestre Mesi Dictator:   Donzetta Matters, P.A. CC:         Prove-It trial, [facility]                           Discharge Summary  DATE OF BIRTH:  [birth year]  PRINCIPAL DISCHARGE DIAGNOSES: 1. Coronary artery disease, status post stenting of proximal left anterior    descending. 2. High blood pressure. 3. Diabetes mellitus, non-insulin-dependent. 4. Low potassium.  PROCEDURES:  Left heart catheterization with stent, PTCA of the LAD.  CONSULTATIONS:  Dr. Aleen Campi for invasive cardiology.  CONDITION ON DISCHARGE:  Stable and improved.  HISTORY OF PRESENT ILLNESS AND HOSPITAL COURSE:  This is a 77 year old male who was admitted on March 23, 1999, with chest pain radiating down the left arm.  He did  have some episodes which did get relief with increase of nitroglycerin.  He was  then seen the next day, with laboratories showing decrease of potassium to 2.9. He was then placed on K-Dur 20 mEq b.i.d., which he received a dosage before he went to the catheterization laboratory.  Repeat CK-MB on March 23, 1999, showed total CK at 75.  CK-MB of 3.2, relative index 4.3.  CBC did show white count of 15,100, hemoglobin 15.7, hematocrit 44.3, platelet count 199.  PTT 72.  He is on heparin at the present time.  Pro time was 14.2, INR of 1.2.  He did have short pauses on he strip, and he is presently on a beta blocker.  After further evaluation, he was  then scheduled for heart catheterization later on on March 24, 1999.  Results did show two vessel coronary artery disease with new proximal LAD lesion of 90%, new first diagonal lesion of 90% a total occlusion of the distal RCA with good collateral flow, very good appearance of prior PTCA and stenting site in the mid LAD from 1996.  He had  good left ventricular function.  He underwent successful  PTCA of the first diagonal branch from 90% to a 30% residual lesion, and successful stent placement in the proximal LAD from 90% to 0%.  He did have successful Perclose to the right femoral artery with no further complications.  The next day he was doing much better.  He continued to have some elevated blood pressure of  167/75.  Respiratory rate was 20, pulse rate was 70, room air pulse oximeter was 96%.  His fasting lipid panel did show total cholesterol at 128, triglycerides 25, HDL decreased at 32, LDL cholesterol at 71.  He is on no cholesterol medications. He was then seen by Lynden Ang, who then discussed with the patient on enrolling in he Prove-It trial today, and it was felt that he was stable enough for discharge home.  LABORATORY DATA DURING THE PATIENTS HOSPITALIZATION:  CBC on admission showed white count of 15,100, hemoglobin 15.7, hematocrit 44.3, platelet count 199. He then had repeat CBC on March 24, 1999, with white count at 12,200, hemoglobin 15.3, hematocrit 45.0, platelet count 192.  Pro time on admission was 14.2, INR of 1.2, PTT of 30.  This was repeated on March 24, 1999, with a PTT of  72.  Chemistries n admission did show a sodium at 140, potassium 3.7, chloride 105, glucose was 272, BUN 20, creatinine 0.7.  Laboratories repeated the next day did show sodium at 41, potassium 2.9, chloride 103, CO2 of 27, glucose of 117, BUN 14, creatinine 0.8,  albumin of 3.5, calcium 8.8, phosphorus 3.2.  Initial total CK was 75, CK-MB 3.2, relative index 4.3, with troponin I at 0.03.  This was repeated again eight hours later with total CK at 67, CK-MB at 5.2, relative index 7.8.  Repeat exam again  eight hours later did show total CK at 67, CK-MB of 5.9, relative index 8.8, troponin I at 0.08.  Cholesterol studies fasting did show a total cholesterol at 128, triglycerides 125, HDL cholesterol 32, LDL  cholesterol 71.  Chest x-ray showed no acute disease.  His initial EKG showed sinus tachycardia with ventricular rate of 122.  Repeat KG the next day showed sinus rhythm with first degree AV block at rate 98.  The patient was felt stable enough for discharge to home.  MEDICATIONS:  1. K-Dur 10 mEq once a day.  2. Plavix 75 mg once a day.  3. Hydrochlorothiazide 25 mg daily.  4. Increase of dobutamine 500 mg 2 tablets daily, this is to increase to 30     tablets daily.  5. Imdur 30 mg daily.  6. Lanoxin 0.25 mg 1-1/2 tablets daily.  7. Norvasc 10 mg daily.  8. Metoprolol 1/2 tablet 50 mg b.i.d.  9. Coated aspirin daily. 10. Multivitamin daily. 11. Wytensin 8 mg daily.  DISCHARGE INSTRUCTIONS:  He is to have no heavy lifting, no driving until the weekend.  DIET:  He is to follow a low salt diet, no sugar diet.  WOUND CARE:  Watch the groin.  If he has any bleeding, he will report this to Dr. Aleen Campi.  SPECIAL INSTRUCTIONS:  BMET fasting at the office and hemoglobin A1C next week.   FOLLOW-UP:  See Dr. Renne Crigler in one to two weeks.  He is also to follow up with Dr. Aleen Campi in two weeks.  He is to follow up with the Prove-It trial tomorrow at 9:30 a.m. DD:  03/25/99 TD:  03/26/99 Job: [id number]

## 2010-06-05 NOTE — Procedures (Signed)
. [facility]  Patient:    Oscar Bruce, Oscar Bruce                       MRN: [id number] Proc. Date: 10/15/99 Adm. Date:  [id number] Attending:  Charna Elizabeth CC:         Soyla Murphy. Renne Crigler, M.D.  Angelia Mould. Derrell Lolling, M.D.   Procedure Report  DATE OF BIRTH:  12/02/1924  REFERRING PHYSICIAN:  Soyla Murphy. Renne Crigler, M.D.  PROCEDURE PERFORMED:  Colonoscopy.  ENDOSCOPIST:  Anselmo Rod, M.D.  INSTRUMENT USED:  Olympus video colonoscope.  INDICATIONS FOR PROCEDURE:  The patient is a 77 year old white male with a past history of colon cancer.  The patient is status post a right hemicolectomy.  Rule out recurrent polyps.  PREPROCEDURE PREPARATION:  Informed consent was procured from the patient. The patient was fasted for eight hours prior to the procedure and prepped with a bottle of magnesium citrate and a gallon of NuLytely the night prior to the procedure.  PREPROCEDURE PHYSICAL:  The patient had stable vital signs.  Neck supple. Chest clear to auscultation.  S1, S2 regular.  Abdomen soft with normal abdominal bowel sounds.  DESCRIPTION OF PROCEDURE:  The patient was placed in the left lateral decubitus position and sedated with 25 mg of Demerol and 2 mg of Versed intravenously.  Once the patient was adequately sedated and maintained on low-flow oxygen and continuous cardiac monitoring, the Olympus video colonoscope was advanced from the rectum to 110 cm without difficulty.  A healthy anastomosis was seen. Distal small bowel appeared healthy without lesions.  No masses, polyps, erosions, ulcerations were seen.  The patient had a small internal hemorrhoid that was nonbleeding at time of examination. There was some residual stool in the left colon but no large masses were identified.  IMPRESSION: 1. Healthy anastomosis at 110 cm. 2. A small internal hemorrhoid.  RECOMMENDATIONS:  Repeat colonoscopy is recommended in the next five years or earlier if  the patient were to develop any abnormal symptoms in the interim.DD: 10/15/99 TD:  10/15/99 Job: [id number]

## 2010-06-05 NOTE — Cardiovascular Report (Signed)
[place]  Patient:    Oscar Bruce, Oscar Bruce                       MRN: [id number] Proc. Date: 07/21/99 Adm. Date:  [id number] Attending:  Silvestre Mesi CC:         Soyla Murphy. Renne Crigler, M.D.             John R. Aleen Campi, M.D.             Cardiac Catheterization Laboratory                        Cardiac Catheterization  REFERRING PHYSICIAN:  Soyla Murphy. Renne Crigler, M.D.  PROCEDURES: 1. Left heart catheterization. 2. Coronary cineangiography. 3. Left internal mammary artery cineangiography. 4. Left ventricular cineangiography. 5. Angioplasty with percutaneous transluminal coronary angioplasty of the    proximal left anterior descending. 6. Percutaneous transluminal coronary angioplasty of the first diagonal    branch. 7. Perclose of the right femoral artery.  INDICATIONS FOR PROCEDURES:  This 77 year old has a history of coronary artery disease and is status post angioplasty with stent placement in his mid LAD in 1996.  This was with a Palmaz-Schatz stent.  He then had unstable angina in March of 2001 and underwent a angioplasty of his proximal LAD after noting a new lesion in the proximal LAD and very good long-term results in his mid LAD stented area.  He also had angioplasty of his first diagonal branch, which was new since 1996.  He then underwent a treadmill stress test in mid June which was early double positive and then scheduled for repeat catheterization today because of the high probability of re-stenosis  DESCRIPTION OF PROCEDURE:  After signing an informed consent, the patient was premedicated with 50 mg of Benadryl intravenously and brought to the cardiac catheterization lab at [facility].  His right groin was prepped and draped in a sterile fashion and anesthetized locally with 1% lidocaine.  A 6 French introducer sheath was inserted percutaneously into the right femoral artery.  The 6 [place] #4 Judkins coronary catheters were used  to make injections into the coronary arteries.  The right coronary catheter was used to make injections into the left subclavian artery visualizing the left internal mammary artery.  A 6 French pigtail catheter was used to measure pressures in the left ventricle and aorta and to make mid stream injections into the left ventricle.  After noting severe re-stenosis within the proximal LAD stent and first diagonal branch lesion, we discussed alternatives with the patient and he requested strongly that another attempt be made at angioplasty of this restenotic area.  We then proceeded with an angioplasty procedure and first selected a 6 [place] JL5 guide catheter which was advanced to the root of the aorta.  After inserting a Hi-Torque Floppy guide wire in this catheter the tip was engaged in the left coronary artery and the wire was advanced into the LAD and easily passed into the distal segment.  We then selected a 3.0 x 15 mm Cutting Balloon catheter, which was advanced over the wire, and multiple attempts at passing this balloon catheter within the lesion were unsuccessful. We then selected a 2.0 x 20 mm Maverick balloon catheter which was advanced over the wire and easily passed within the proximal LAD lesion.  One inflation was made at 14 atmospheres for 39 seconds.  We then reinserted the Cutting Balloon, and  again we were unable to pass the Cutting Balloon into the proximal LAD lesion.  After multiple attempts we then replaced this JL5 guide catheter with a 6 [place] AL-2 guide catheter which was advanced to the root of the aorta.  After moderate difficulty in engaging the ostium of this AL-2 in the left main, we were then able to pass the guidewire and the Cutting Balloon catheter into the proximal LAD.  After positioning it within the restenotic area, two inflations were made, the first at 14 atmospheres for 63 seconds and the second at 20 atmospheres for 41 seconds.  After noting a  partial opening we replaced this Cutting Balloon with a second Cutting Balloon which was a 3.5 x 15 mm balloon and after easily advancing this second Cutting Balloon into the proximal LAD lesion, one inflation was made at 20 atmospheres for 44 seconds.  The Cutting Balloon was removed and the wire was then withdrawn to the proximal LAD and after moderate difficulty it was advanced into the first diagonal branch and we reinserted the 2.0 x 20 mm Maverick balloon over this wire and positioned it within the first proximal diagonal lesion.  One inflation was made at 20 atmospheres for 38 seconds.  After the balloon ruptured, it was removed and injections again into the left coronary artery showed an excellent angiographic result in both sites and very normal antegrade flow.  There was no evidence of dissection.  At the end of the procedure the catheter and sheath were removed from the right femoral artery and hemostasis was easily obtained with a Perclose closure system.  MEDICATIONS GIVEN:  Heparin 7500 units IV.  ReoPro drip per pharmacy protocol.  HEMODYNAMIC DATA:  Left ventricular pressure 156/0-14, aortic pressure 156/77 with a mean of 107.  Left ventricular ejection fraction was estimated at approximately 60%.  CINE FINDINGS:  CORONARY CINE ANGIOGRAPHY:  Left coronary artery:  The ostium and left main appear normal.  Left anterior descending:  The LAD has a critical 90% stenosis in its proximal segment with a new stenotic area just before the stent and re-stenosis within the proximal stent of 80-90%.  The first diagonal follows this first stent and there is a 95% ostial lesion.  There is a 75% lesion in the middle segment of this first diagonal.  Just distal to the takeoff of this first diagonal, there is a second stent, which was placed in 1996 and it still has a very good long-term appearance without any evidence of re-stenosis.  The distal LAD has moderate irregularities but  without a significant lesion.  Circumflex coronary artery:  There are minor plaque throughout the middle  segment but there is normal flow and no evidence for a significant lesion.  Right coronary artery:  The ostium appears normal.  The proximal and middle segments have extensive plaque with a 50% proximal stenosis and a 40-50% stenosis in the middle segment at the acute angle.  The distal right coronary artery is abnormal with total occlusion of the very distal right coronary artery at the crux with retrograde flow into this distal posterolateral branch during injections into the left coronary artery.  The first posterolateral branch beyond the posterior descending, has a severe 90% stenosis in its proximal segment.  The posterior descending has a focal 75% lesion in its middle segment.  The appearance of the right coronary artery is essentially unchanged from his study in March.  Left internal mammary artery appears normal.  There is a focal eccentric  plaque in the proximal left subclavian artery of approximately 25% stenosis.  LEFT VENTRICULAR CINEANGIOGRAM:  The left ventricular chamber size appears normal.  The contractility appears normal without segmental abnormality. The mitral and aortic valves appear normal.  ANGIOPLASTY CINE:  Cine taken during the angioplasty procedure shows proper positioning of the guidewire and balloon catheters with very good balloon form obtained in the proximal LAD and first diagonal branches.  Final injections into the left coronary artery shows an excellent angiographic result with 0% residual lesion within the two angioplasty sites.  FINAL DIAGNOSES:  1. Severe re-stenosis within the proximal left anterior descending stent     from March 2001.  2. Severe re-stenosis within the first diagonal branch angioplasty site,     March 2001.  3. Very good long-term appearance of the mid left anterior descending stented     area from 1996.  4. No  change in the right coronary artery and circumflex from March 2001.  5. Severe distal stenosis within the right coronary artery.  6. Normal left internal mammary artery.  7. Mild stenosis, left subclavian artery.  8. Normal left ventricular function.  9. Successful angioplasty with Cutting Balloon of the proximal left anterior     descending within the stent. 10. Successful angioplasty in the first diagonal branch. 11. Successful Perclose of the right femoral artery.  DISPOSITION:  Will monitor in the EAU and plan to discharge tomorrow.  We will give Ticlid this time instead of Plavix as he was given Ticlid in 1996 and Plavix in March of 2001 with better results in 1996.  We will also continue the ReoPro drip. DD:  07/21/98 TD:  07/22/99 Job: [id number]

## 2010-06-05 NOTE — Discharge Summary (Signed)
[place]. [facility]  Patient:    Oscar Bruce, Oscar Bruce                       MRN: [id number] Adm. Date:  [id number] Disc. Date: [id number] Attending:  Mikey Bussing Dictator:   Loura Pardon, P.A.                           Discharge Summary  DATE OF BIRTH:  1924-12-09  ADDENDUM:  His digoxin medication will be changed from 0.125 mg daily to 0.25 mg daily.  This information has been communicated to the patient and this should be added to his discharge summary changing the digoxin from 0.125 to 0.25. DD:  02/18/00 TD:  02/18/00 Job: [id number]

## 2010-06-05 NOTE — Op Note (Signed)
[place]. [facility]  Patient:    Oscar Bruce, Oscar Bruce                       MRN: [id number] Proc. Date: 02/05/00 Adm. Date:  [id number] Attending:  Silvestre Mesi CC:         CP Lab             Aram Candela. Aleen Campi, M.D.                           Operative Report  PROCEDURE:  Retrograde central aortic catheterization, selective coronary angiography via Judkins technique, LV angiogram ROA and LOA projection, abdominal aortic angiogram, midstream PA projection, subselective IMAs. Successful 6 French Perclose single stitch closure device.  DESCRIPTION OF PROCEDURE:  The patient was brought to the second floor CP lab in a postabsorptive state following premedication with 5 mg Valium p.o. Xylocaine 1% was used for local anesthesia, and 6 French 4 cm tapered Cordis preformed coronary pigtail catheters were used for selective coronary angiography, followed by selective LIMA, RIMA, and LV angiogram in the RAO and LAO projection 25 cc, 14 cc per second, 20 cc, 12 cc per second.  Pullback across the CA was performed and showed no gradient across the aortic valve. 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  There was mild infrarenal atherosclerotic disease with patent proximal iliacs and good runoff.  The catheters were removed.  The sidearm sheath was flushed.  A single-stitch Perclose 6 French device was closed for arterial closure successfully.  The patient was brought to the holding area for postoperative care and monitoring in stable condition.  He tolerated the procedure well.  RESULTS:  Pressures:  LV:  150/0.  LVEDP 18 mmHg.  CA:  160/82 mmHg.  There was no gradient across the aortic valve on catheter pullback.  Fluoroscopy  revealed 2-3+ calcification of the proximal LAD and 3+ calcification of the proximal RCA.  There was mild mitral annular calcification but no other significant intracardiac or valvular calcification.  LV angiogram in the RAO and LAO projection showed only mild hypokinesis in the mid-anterolateral wall with an otherwise normally-contracting ventricle and EF of approximately 55% with no significant mitral regurgitation.  Fluoroscopy visualized the proximal LAD and proximal-mid LAD stents.  The main left coronary artery was normal.  The ostial LAD at the very proximal portion had 95% stenosis up to the area of prior proximal LAD stent, which ended just before the large diagonal branch. The diagonal branch had 90% ostial stenosis and 85% stenosis eccentrically in the midportion and bifurcated distally and was a moderate-sized vessel.  The second remote LAD stent was then reached at the junction of the proximal and mid-third of the LAD and was widely patent.  The proximal stent had 50% narrowing beyond the 95% ostial occlusion.  There was another 60% narrowing in the midportion of the LAD, followed by an eccentric 90% narrowing at the junction of the midportion of the LAD.  The remainder of the vessel coursed the apex and undersurface of the heart.  The circumflex artery gave off a large, tortuous marginal that had 50-60% narrowing with haziness in the proximal third and was otherwise a good vessel. The circumflex  proper had no significant stenosis, was of moderate size, with a small PABG branch.  The right coronary was diffusely diseased and with heavy calcification.  There was 50% narrowing segmentally in the proximal third, followed by focal 7% narrowing at the junction of the proximal and mid-third with a segmental 60% narrowing up to the acute margin.  There was 70% stenosis in the distal third. The PLAs and PDAs were big branches, but each branch had a stenosis of 80-90% in the  proximal third and a second tandem 70% stenosis of the PDA before its distal trifurcation.  There were also some collaterals from the left coronary system to the distal RCA.  DISCUSSION:  Oscar Bruce is a 77 year old gentleman who has had prior PCA and stenting of the proximal-mid-LAD in 1996, which has held up longterm without restenosis.  In March 2001, he had progression of disease and proximal LAD stenting with PTCA of the side branch diagonal with 3.0 and 2.0 balloons, respectively.  He had a good result at that time.  Unfortunately, on July 21, 1999, he had diffuse in-stent restenosis with extension of disease up to the ostia of the LAD.  In 1996, stent was patent and he had moderate other disease.  Dr. Aleen Campi did an aggressive, excellent intervention with upsizing small balloons to cross this proximal LAD stent and ultimate cutting balloon with a good angiographic result.  He has had one episode of angina when he was in the airport coming back from [place] and otherwise some vague lightheaded spells, no syncope or presyncope.  He has had an early positive Cardiolite with anterior and anterolateral ischemia, prompting this current catheterization.  His RIMA is widely patent.  His LIMA is widely patent, and there is a 15-20% narrowing of the left subclavian.  The patient has progression of disease and moderate in-stent restenosis of the proximal LAD but high-grade disease up to the ostia of the LAD.  He also has disease in his diagonal, mid-LAD, has progression of disease in his right.  In view of his past interventions and multiple restenosis and the location of his proximal LAD disease along with his diabetes, I think he is a candidate for multivessel bypass surgery.  CATHETERIZATION DIAGNOSES: 1. Atherosclerotic heart disease, status post prior proximal-mid-left    anterior descending artery stenting in 1996, no longerm restenosis. 2. Proximal left anterior descending  artery stenting March 2001. 3. Restenosis and cutting balloon percutaneous transluminal coronary     angioplasty proximal left anterior descending artery stent in 2002. 4. Recurrent angina and early positive Cardiolite with progression of disease    as outlined above. 5. Well-preserved left ventricular function. 6. Systemic hypertension. 7. Adult onset diabetes. 8. Hyperlipidemia. DD:  02/05/00 TD:  02/08/00 Job: [id number]

## 2010-06-05 NOTE — Discharge Summary (Signed)
[place]. [facility]  Patient:    SHYHEIM, TANNEY                       MRN: [id number] Adm. Date:  [id number] Disc. Date: [id number] Attending:  Mikey Bussing Dictator:   Loura Pardon, P.A. CC:         Soyla Murphy. Renne Crigler, M.D.  John R. Aleen Campi, M.D.   Discharge Summary  DATE OF BIRTH:  January 21, 2024  PRIMARY CARE GIVER:  Soyla Murphy. Renne Crigler, M.D.  CARDIOLOGIST:  Aram Candela. Tysinger, M.D.  DISCHARGE DIAGNOSES: 1. Abnormal Cardiolite stress on February 04, 2000, leading to left heart    catheterization with finding of class IV unstable angina with severe   three-vessel atherosclerotic coronary artery disease. 2. Episode of second-degree heart block on hospital day #3 here at [facility]    [facility].  Atrial fibrillation with controlled ventricular    rate on postoperative day #2, converting a sinus rhythm on postoperative    day #7, and remaining in sinus rhythm. 3. Elevated blood urea nitrogen on postoperative day #4 with negative renal    ultrasound.  SECONDARY DIAGNOSES: 1. History of atherosclerotic coronary artery disease, status post    percutaneous transluminal coronary angioplasty and stenting of the left    anterior descending in March of 2001. 2. Type 2 diabetes mellitus. 3. History of supraventricular tachycardia. 4. History of cataracts. 5. Benign prostatic hypertrophy. 6. Status post cholecystectomy. 7. Status post hemorrhoidectomy. 8. History of kidney stones. 9. Hypertension.  PROCEDURES: 1. On February 05, 2000, left heart catheterization, left ventriculogram, and    coronary angiography.  This study was done by Richard A. Alanda Amass, M.D.    This study demonstrated that the left main was free of disease.  The left    anterior descending at the ostium had a 95% stenosis.  There was a    60% narrowing in the mid point of the left anterior descending and an    eccentric 90% narrowing at the junction of the mid portion of the left  anterior descending.  The first diagonal had a 90% ostial stenosis and an    85% stenosis eccentually in the mid portion.  The proximal stent in the    left anterior descending had a 50% narrowing after the 95% ostial    occlusion.  The second remote left anterior descending stent was widely    patent.  The left circumflex gave a large tortuous marginal which had a    50-60% narrowing in the proximal third.  The right coronary artery was    diffusely diseased.  There was a 50% narrowing in the proximal third    followed by focal narrowing at the junction of the proximal mid third with    a segmental 60% narrowing at the acute margin.  There was a 70% stenosis in    the distal third of the right coronary artery.  The posterolateral artery    and the posterior descending artery were big branches, but each branch had    a stenosis of 80-90% in the proximal third and a second tandem 70% stenosis    in the posterior descending artery before its distal trifurcation. 2. On February 05, 2000, carotid duplex ultrasound and ankle brachial indexes.    The ultrasound was negative for significant internal carotid artery    stenosis.  Ankle brachial indexes were greater than 1.0 bilaterally. 3. On February 09, 2000, coronary  artery bypass graft surgery x 5.  In this    procedure, the left internal mammary artery was connected in an end-to-side    fashion to the left anterior descending coronary artery, a reverse    saphenous vein graft was fashioned from the aorta to the diagonal, a    reverse saphenous vein graft was fashioned from the aorta to the obtuse    marginal, and a reverse sequential saphenous vein graft was fashioned from    the aorta to the posterior descending and to the posterolateral branch of    the right coronary artery.  Mikey Bussing, M.D., surgeon.  The    patient was maintained on atrioventricular pacing and discharged to the    intensive care unit in stable and satisfactory  condition.  DISPOSITION:  Mr. Edan Juday is a candidate for discharge on postoperative day #9, February 18, 2000.  He experienced no respiratory insufficiency in the postoperative period and was relieved of all supplemental oxygen by postoperative day #1.  He experienced atrial fibrillation with a controlled ventricular rate on the morning of postoperative day #2.  This was controlled at first with a beta blocker, digoxin, and a Cardizem drip.  The Cardizem drip was later discontinued on postoperative day #3.  A rising BUN was noted on postoperative day #4.  The BUN was 65.  A renal ultrasound was obtained that gave no evidence of hydronephrosis.  There was normal parenchymal echogenicity and no renal masses were noted.  Mr. Edgerly was started on oral Coumadin dosing for his dysrhythmias on postoperative day #3 and achieved a therapeutic level.  By postoperative day #6, he goes home on alternating doses of 5 mg and 2.5 mg of Coumadin.  Mr. Koren has been afebrile in the postoperative period. His mental status has been clear.  His incisions are healing nicely.  He is ambulating independently without desaturation of his oxygen levels.  He goes home on the following medications:  DISCHARGE MEDICATIONS: 1. Ultram 50 mg one to two tablets p.o. q.4-6h. p.r.n. pain. 2. Coumadin alternating daily dose of 2.5 mg and 5.0 mg 3. Lanoxin 0.125 mg daily. 4. Altace 2.5 mg daily. 5. Amaryl 4 mg twice daily.  DISCHARGE ACTIVITY:  No driving for the next six weeks.  No lifting for more than 10 pounds for the next six weeks.  DISCHARGE DIET:  Low-sodium, low-cholesterol, ADA diet.  WOUND CARE:  He may shower daily, keeping his incisions clean and dry.  FOLLOW-UP:  He will have home health attend him on February 22, 2000, to obtain a PT INR to measure the level of his anticoagulant Coumadin.  The results will be sent to John R. Aleen Campi, M.D.  He will have staple removal on February 24, 2000, at 9:30  a.m. at the office of [facility]  [place].  He is asked to call John R. Tysinger, M.D., to arrange an appointment for two weeks after discharge and a chest x-ray will be taken.  He will also see Mikey Bussing, M.D., postoperatively and that appointment is scheduled for March 04, 2000, at 12:15 p.m.  A schedule of the Coumadin dosing with the corresponding laboratories drawn here at [facility]. [facility] will be sent to Dr. Aram Candela. Tysingers office.  BRIEF HISTORY:  Mr. Merdith Boyd is a 77 year old male with known history of of atherosclerotic coronary artery disease.  A stent was placed in the left anterior descending coronary artery in March of  2001.  His last left heart catheterization was on July 21, 1999.  At that time, restenosis of the proximal LAD stent was noted.  He was given Ticlid after the left heart catheterization on July 21, 1999.  Lately he is complaining of dizziness and sporadic chest pain which is intermittent.  He was referred for Cardiolite stress test on February 04, 2000.  The study showed mild anteroseptal inferior ischemia with a normal ejection fraction.  The patient had exertional chest pressure, but rarely.  However, during the stress test he did have anginal pain.  He was referred to [facility]. [facility] for a left heart catheterization.  HOSPITAL COURSE:  Mr. Jadian Karman entered [place] on February 05, 2000.  He underwent left heart catheterization the same day.  This study revealed severe three-vessel atherosclerotic coronary artery disease. He was seen in consultation by Mikey Bussing, M.D., of the [facility].  The risks and benefits of revascularization surgery were described to Mr. Beverely Pace.  While awaiting surgery, he had an episode of second degree heart block on February 07, 2000. This was not repeated, however.  He underwent coronary  artery bypass graft surgery x 5 on February 09, 2000.  He was AV paced in the postoperative period. He was extubated on the day of surgery.  He was achieving 97% oxygen saturations on room air on postoperative day #1.  His immediate postoperative hematocrit was 31% and his creatinine was 1.0.  On postoperative day #2, he presented with atrial fibrillation with a controlled ventricular rate.  He was started on digoxin.  He was maintained on his beta blocker.  He was also started on a Cardizem drip.  On postoperative day #3, he was started on Coumadin, which as dictated above became therapeutic by postoperative day #6. He had also been maintained on subcutaneous Lovenox until that time.  It was noted on postoperative day #4 that his BUN was elevated to 65.  A renal ultrasound was obtained, showing no evidence of hydronephrosis.  On postoperative day #6, his creatinine was 0.9.  He had converted to a sinus rhythm by postoperative day #7 and he remains in sinus rhythm until discharge. As mentioned in discharge disposition, he is ambulating, he has been afebrile, and his mental status has been clear.  His incisions are healing nicely.  He is taking oral nourishment and tolerating it well.  He will follow up with Aram Candela. Aleen Campi, M.D., and Kathlee Nations Suann Larry, M.D., as dictated. DD:  02/17/00 TD:  02/18/00 Job: [id number]

## 2010-06-05 NOTE — Cardiovascular Report (Signed)
[place]. [facility]  Patient:    Oscar Bruce, Oscar Bruce                       MRN: [id number] Proc. Date: 03/24/99 Adm. Date:  [id number] Disc. Date: [id number] Attending:  Silvestre Mesi CC:         Aram Candela. Aleen Campi, M.D.             Cardiac Catheterization Laboratory                        Cardiac Catheterization  REFERRING PHYSICIAN:  Jaclyn Prime. Lucas Mallow, M.D.  PROCEDURES: 1. Left heart catheterization. 2. Coronary cineangiography. 3. Left ventricular cineangiography. 4. Percutaneous transluminal coronary angioplasty of the first diagonal branch.  5. Primary stent placement in the proximal left anterior descending. 6. Perclose of the right femoral artery.  INDICATIONS FOR PROCEDURE:  This 77 year old male has a history of coronary artery disease and is status post angioplasty with stent placement in his mid LAD in 1996. He was admitted yesterday after an episode of severe anterior chest pain which as very typical of preinfarction angina and similar to his prior angina in 1995 and 1996.  DESCRIPTION OF PROCEDURE:  After signing an informed consent, the patient was premedicated with 50 mg of Benadryl intravenously and brought to the cardiac catheterization lab at [facility].  His right groin was prepped and draped in a sterile fashion and anesthetized locally with 1% lidocaine.  A #6 French introducer sheath was inserted percutaneously into the right femoral artery.  A 6 French #4 Judkins coronary catheter was used to make injections into the left and right coronary arteries.  A 6 French pigtail catheter was used to measure pressures in the left ventricle and aorta and to make a mid stream injection into the left ventricle.  After noting a new critical lesion in his proximal LAD, and a new critical lesion in his first large diagonal branch along with a very good long-term appearance of the stent in his mid LAD, we elected to proceed with  an angioplasty procedure.  We also noted total occlusion of his distal right coronary artery which had very good collateral flow.  Using the 6 French introducer sheath, we chose 6 [place], #5 Judkins coronary guide catheter which was advanced to the root of the aorta.  His left coronary guide catheter was then engaged in the ostium of the eft coronary artery.  We then selected a Hi-Torque Floppy guidewire which was advanced into the LAD and through the proximal segment and lesion, and then after moderate difficulty, it was advanced into the large first diagonal branch.  We then selected a 2.5 x 15 mm balloon catheter which was advanced over the guidewire to the distal segment of the wire.  There was an area that we were unable to pass this catheter over this guidewire requiring removal of the guidewire and balloon catheter. We then selected a standard length luge guidewire which we were able to recross the lesion and advance into the first diagonal branch.  We then reinserted the 2.5 x 15 mm CrossSail balloon catheter which was advanced over the wire and positioned within the diagonal lesion.  One inflation was made at 8 atmospheres for a minute and 40 seconds.  After removing this balloon catheter, an injection again into the left coronary artery showed a good angiographic result with a 30% residual lesion and normal antegrade flow.  We then advanced a luge guidewire through the proximal LAD lesion and positioned it in the distal LAD.  We selected a 3.0 x 12 mm NIR Elite stent deployment system and after proper preparation this was inserted over the guidewire and positioned within the proximal LAD lesion.  The stent was then deployed with one inflation at 18 atmospheres for 40 seconds.  After this stent deployment, the balloon catheter was removed and final injection into the  left coronary artery showed an excellent angiographic result within the stented  area and a  continued good appearance of the LAD lesion.  The patient tolerated he procedure well and no complications were noted.  At the end of the procedure the catheter and sheath were removed from the right femoral artery and hemostasis was easily obtained with a Perclose closure system.  MEDICATIONS GIVEN:  ReoPro drip per pharmacy protocol.  Heparin IV bolus 4000 units, nitroglycerin intracoronary, a total of 400 units.  Nitroglycerin drip was continued throughout the procedure intravenously.  The patient was then admitted to the [facility] for further monitoring.  HEMODYNAMIC DATA:  Left ventricular pressure 193/0-19, aortic pressure 193/92 with a mean of 132.  Left ventricular ejection fraction 60%.  CINE FINDINGS:  Left coronary artery:  The ostium and left main appeared normal.  There is mild  calcification at the bifurcation.  Left anterior descending:  The LAD has a calcified plaque throughout the proximal segment.  The distal portion of this calcified plaque has a raised area which causes a 90% focal eccentric lesion.  Just distal to this lesion is the takeoff of the first diagonal branch and this large diagonal branch has a focal eccentric 0% lesion in its proximal segment.  The mid LAD is the site of the stent placement in 1996 and this area now appears to be essentially normal.  Circumflex coronary artery:  The circumflex coronary artery has mild plaque throughout its middle segments with segmental narrowing of approximately 30-40%.  Right coronary artery:  The ostium appears normal.  The proximal and middle segment through the acute angle has a segmental plaque causing a 30-40% stenosis throughout this whole proximal and middle segment.  The distal LAD is totalled past the crux with the last two posterolateral branches filling retrograde during injections into the left coronary artery.  The posterior descending is a large branch which has a focal eccentric  60% stenosis in its middle segment.  The first posterolateral branch to the left ventricle appears normal.  The right coronary artery is then totally occluded at the acute  angle between the second and third posterolateral branches to the left ventricle.  LEFT VENTRICULAR CINEANGIOGRAM:  The left ventricular chamber size is mildly enlarged.  The overall left ventricular contractility is normal with normal-appearing left ventricular wall thickness.  There are no abnormally moving segments.  ANGIOPLASTY PROCEDURE:  Cine taken during the angioplasty procedure shows proper positioning of the guidewires and balloon catheter with a good balloon form obtained in the large diagonal branch lesion.  Further cine showed proper positioning of the stent in the proximal LAD and final injections into the left  coronary artery showed an excellent angiographic result with 0% residual lesion in the proximal LAD lesion and a 30% residual lesion in the large diagonal branch lesion.  FINAL DIAGNOSES: 1. Three-vessel coronary artery disease with new proximal left anterior descending    lesion, 90%, new first diagonal lesion, 90%, total occlusion of the distal    right coronary artery with  good collateral flow, segmental plaque in the    proximal and mid right coronary artery, segmental plaque in the mid    circumflex and very good long-term appearance of the prior percutaneous    transluminal coronary angioplasty and stent site in the mid left anterior    descending from 1996. 2. Good left ventricular function. 3. Successful angioplasty with percutaneous transluminal coronary angioplasty    of the first diagonal branch lesion, 90% to 30%. 4. Successful stent placement in the proximal left anterior descending    lesion from 90% to 0%. 5. Successful Perclose of the right femoral artery puncture site. DD:  03/24/99 TD:  03/26/99 Job: [id number]

## 2010-06-05 NOTE — Op Note (Signed)
NAME:  Oscar Bruce, Oscar Bruce NO.:  [id number]   MEDICAL RECORD NO.:  [id number]          PATIENT TYPE:  AMB   LOCATION:  ENDO                         FACILITY:  [facility]   PHYSICIAN:  Anselmo Rod, M.D.  DATE OF BIRTH:  1924-03-16   DATE OF PROCEDURE:  03/08/2005  DATE OF DISCHARGE:                                 OPERATIVE REPORT   PROCEDURE PERFORMED:  Esophagogastroduodenoscopy.   ENDOSCOPIST:  Anselmo Rod, M.D.   INSTRUMENT USED:  Olympus video panendoscope.   INDICATION FOR PROCEDURE:  A 77 year old white male with a history of a  gastric ulcer undergoing a repeat EGD to document healing.   PREPROCEDURE PREPARATION:  Informed consent was procured from the patient.  The patient was fasted for eight hours prior to the procedure and the  aspirin and Coumadin were held for five days prior to the procedure.   PREPROCEDURE PHYSICAL:  VITAL SIGNS:  The patient had stable vital signs.  NECK:  Supple.  CHEST:  Clear to auscultation.  S1, S2 regular.  ABDOMEN:  Soft with normal bowel sounds.   DESCRIPTION OF PROCEDURE:  The patient was placed in the left lateral  decubitus position and sedated with 70 mcg of fentanyl and 7 mg of Versed in  slow incremental doses.  Once the patient was adequately sedate and  maintained on low-flow oxygen and continuous cardiac monitoring, the Olympus  video panendoscope was advanced through the mouthpiece, over the tongue,  into the esophagus under direct vision.  The entire esophagus appeared  normal with no evidence of ring, stricture, masses, esophagitis or Barrett's  mucosa.  The scope was then advanced into the stomach.  Mild antral  gastritis was noted.  The rest of the gastric mucosa appeared healthy, and  so did the proximal small bowel.   IMPRESSION:  Mild antral gastritis, otherwise normal  esophagogastroduodenoscopy.   RECOMMENDATIONS:  1.Continue PPIs for now.  2.Resume Coumadin and aspirin today.  3.Outpatient  follow-up as the need arises in the future.      Anselmo Rod, M.D.  Electronically Signed     JNM/MEDQ  D:  03/08/2005  T:  03/08/2005  Job:  [id number]   cc:   Soyla Murphy. Renne Crigler, M.D.  Fax: [phone]   Antionette Char, MD  Fax: [phone]   Samul Dada, M.D.  Fax: [phone]

## 2010-06-08 ENCOUNTER — Encounter (HOSPITAL_COMMUNITY): Payer: Self-pay

## 2010-06-10 ENCOUNTER — Encounter (HOSPITAL_COMMUNITY): Payer: Self-pay

## 2010-06-12 ENCOUNTER — Encounter (HOSPITAL_COMMUNITY): Payer: Self-pay

## 2010-06-15 ENCOUNTER — Encounter (HOSPITAL_COMMUNITY): Payer: Self-pay

## 2010-06-17 ENCOUNTER — Encounter (HOSPITAL_COMMUNITY): Payer: Self-pay

## 2010-06-19 ENCOUNTER — Encounter (HOSPITAL_COMMUNITY): Payer: Self-pay | Attending: Internal Medicine

## 2010-06-19 DIAGNOSIS — I2510 Atherosclerotic heart disease of native coronary artery without angina pectoris: Secondary | ICD-10-CM | POA: Insufficient documentation

## 2010-06-19 DIAGNOSIS — I10 Essential (primary) hypertension: Secondary | ICD-10-CM | POA: Insufficient documentation

## 2010-06-19 DIAGNOSIS — Z8249 Family history of ischemic heart disease and other diseases of the circulatory system: Secondary | ICD-10-CM | POA: Insufficient documentation

## 2010-06-19 DIAGNOSIS — Z951 Presence of aortocoronary bypass graft: Secondary | ICD-10-CM | POA: Insufficient documentation

## 2010-06-19 DIAGNOSIS — Z5189 Encounter for other specified aftercare: Secondary | ICD-10-CM | POA: Insufficient documentation

## 2010-06-19 DIAGNOSIS — E7800 Pure hypercholesterolemia, unspecified: Secondary | ICD-10-CM | POA: Insufficient documentation

## 2010-06-19 DIAGNOSIS — E119 Type 2 diabetes mellitus without complications: Secondary | ICD-10-CM | POA: Insufficient documentation

## 2010-06-22 ENCOUNTER — Encounter (HOSPITAL_COMMUNITY): Payer: Self-pay

## 2010-06-24 ENCOUNTER — Encounter (HOSPITAL_COMMUNITY): Payer: Self-pay

## 2010-06-26 ENCOUNTER — Encounter (HOSPITAL_COMMUNITY): Payer: Self-pay

## 2010-06-29 ENCOUNTER — Encounter (HOSPITAL_COMMUNITY): Payer: Self-pay

## 2010-06-30 ENCOUNTER — Ambulatory Visit (INDEPENDENT_AMBULATORY_CARE_PROVIDER_SITE_OTHER): Payer: Medicare Other | Admitting: *Deleted

## 2010-06-30 DIAGNOSIS — I4891 Unspecified atrial fibrillation: Secondary | ICD-10-CM

## 2010-07-01 ENCOUNTER — Encounter: Payer: Self-pay | Admitting: Internal Medicine

## 2010-07-01 ENCOUNTER — Encounter (HOSPITAL_COMMUNITY): Payer: Self-pay

## 2010-07-03 ENCOUNTER — Encounter (HOSPITAL_COMMUNITY): Payer: Self-pay

## 2010-07-06 ENCOUNTER — Encounter (HOSPITAL_COMMUNITY): Payer: Self-pay

## 2010-07-08 ENCOUNTER — Encounter (HOSPITAL_COMMUNITY): Payer: Self-pay

## 2010-07-09 ENCOUNTER — Ambulatory Visit (INDEPENDENT_AMBULATORY_CARE_PROVIDER_SITE_OTHER): Payer: Medicare Other | Admitting: *Deleted

## 2010-07-09 DIAGNOSIS — I4891 Unspecified atrial fibrillation: Secondary | ICD-10-CM

## 2010-07-10 ENCOUNTER — Encounter (HOSPITAL_COMMUNITY): Payer: Self-pay

## 2010-07-12 ENCOUNTER — Other Ambulatory Visit: Payer: Self-pay | Admitting: Internal Medicine

## 2010-07-13 ENCOUNTER — Encounter (HOSPITAL_COMMUNITY): Payer: Self-pay

## 2010-07-15 ENCOUNTER — Encounter (HOSPITAL_COMMUNITY): Payer: Self-pay

## 2010-07-17 ENCOUNTER — Encounter (HOSPITAL_COMMUNITY): Payer: Self-pay

## 2010-07-20 ENCOUNTER — Encounter (HOSPITAL_COMMUNITY): Payer: Self-pay | Attending: Internal Medicine

## 2010-07-20 DIAGNOSIS — Z5189 Encounter for other specified aftercare: Secondary | ICD-10-CM | POA: Insufficient documentation

## 2010-07-20 DIAGNOSIS — I2510 Atherosclerotic heart disease of native coronary artery without angina pectoris: Secondary | ICD-10-CM | POA: Insufficient documentation

## 2010-07-20 DIAGNOSIS — E7800 Pure hypercholesterolemia, unspecified: Secondary | ICD-10-CM | POA: Insufficient documentation

## 2010-07-20 DIAGNOSIS — E119 Type 2 diabetes mellitus without complications: Secondary | ICD-10-CM | POA: Insufficient documentation

## 2010-07-20 DIAGNOSIS — I10 Essential (primary) hypertension: Secondary | ICD-10-CM | POA: Insufficient documentation

## 2010-07-20 DIAGNOSIS — Z8249 Family history of ischemic heart disease and other diseases of the circulatory system: Secondary | ICD-10-CM | POA: Insufficient documentation

## 2010-07-20 DIAGNOSIS — Z951 Presence of aortocoronary bypass graft: Secondary | ICD-10-CM | POA: Insufficient documentation

## 2010-07-21 ENCOUNTER — Encounter: Payer: Self-pay | Admitting: *Deleted

## 2010-07-21 NOTE — Progress Notes (Signed)
Pacer remote check  

## 2010-07-22 ENCOUNTER — Encounter (HOSPITAL_COMMUNITY): Payer: Self-pay

## 2010-07-24 ENCOUNTER — Encounter (HOSPITAL_COMMUNITY): Payer: Self-pay

## 2010-07-27 ENCOUNTER — Encounter (HOSPITAL_COMMUNITY): Payer: Self-pay

## 2010-07-28 ENCOUNTER — Encounter: Payer: Medicare Other | Admitting: *Deleted

## 2010-07-29 ENCOUNTER — Encounter (HOSPITAL_COMMUNITY): Payer: Self-pay

## 2010-07-31 ENCOUNTER — Encounter (HOSPITAL_COMMUNITY): Payer: Self-pay

## 2010-08-03 ENCOUNTER — Encounter (HOSPITAL_COMMUNITY): Payer: Self-pay

## 2010-08-04 ENCOUNTER — Ambulatory Visit (INDEPENDENT_AMBULATORY_CARE_PROVIDER_SITE_OTHER): Payer: Medicare Other | Admitting: *Deleted

## 2010-08-04 DIAGNOSIS — I4891 Unspecified atrial fibrillation: Secondary | ICD-10-CM

## 2010-08-05 ENCOUNTER — Encounter (HOSPITAL_COMMUNITY): Payer: Self-pay

## 2010-08-07 ENCOUNTER — Encounter (HOSPITAL_COMMUNITY): Payer: Self-pay

## 2010-08-10 ENCOUNTER — Encounter (HOSPITAL_COMMUNITY): Payer: Self-pay

## 2010-08-12 ENCOUNTER — Encounter (HOSPITAL_COMMUNITY): Payer: Self-pay

## 2010-08-14 ENCOUNTER — Encounter (HOSPITAL_COMMUNITY): Payer: Self-pay

## 2010-08-17 ENCOUNTER — Encounter (HOSPITAL_COMMUNITY): Payer: Self-pay

## 2010-08-19 ENCOUNTER — Encounter (HOSPITAL_COMMUNITY): Payer: Self-pay | Attending: Internal Medicine

## 2010-08-19 DIAGNOSIS — Z951 Presence of aortocoronary bypass graft: Secondary | ICD-10-CM | POA: Insufficient documentation

## 2010-08-19 DIAGNOSIS — Z5189 Encounter for other specified aftercare: Secondary | ICD-10-CM | POA: Insufficient documentation

## 2010-08-19 DIAGNOSIS — Z8249 Family history of ischemic heart disease and other diseases of the circulatory system: Secondary | ICD-10-CM | POA: Insufficient documentation

## 2010-08-19 DIAGNOSIS — E7800 Pure hypercholesterolemia, unspecified: Secondary | ICD-10-CM | POA: Insufficient documentation

## 2010-08-19 DIAGNOSIS — I2510 Atherosclerotic heart disease of native coronary artery without angina pectoris: Secondary | ICD-10-CM | POA: Insufficient documentation

## 2010-08-19 DIAGNOSIS — E119 Type 2 diabetes mellitus without complications: Secondary | ICD-10-CM | POA: Insufficient documentation

## 2010-08-19 DIAGNOSIS — I10 Essential (primary) hypertension: Secondary | ICD-10-CM | POA: Insufficient documentation

## 2010-08-21 ENCOUNTER — Encounter (HOSPITAL_COMMUNITY): Payer: Self-pay

## 2010-08-24 ENCOUNTER — Encounter (HOSPITAL_COMMUNITY): Payer: Self-pay

## 2010-08-26 ENCOUNTER — Encounter (HOSPITAL_COMMUNITY): Payer: Self-pay

## 2010-08-28 ENCOUNTER — Encounter (HOSPITAL_COMMUNITY): Payer: Self-pay

## 2010-08-31 ENCOUNTER — Encounter (HOSPITAL_COMMUNITY): Payer: Self-pay

## 2010-09-02 ENCOUNTER — Encounter (HOSPITAL_COMMUNITY): Payer: Self-pay

## 2010-09-04 ENCOUNTER — Encounter (HOSPITAL_COMMUNITY): Payer: Self-pay

## 2010-09-07 ENCOUNTER — Encounter (HOSPITAL_COMMUNITY): Payer: Self-pay

## 2010-09-09 ENCOUNTER — Encounter (HOSPITAL_COMMUNITY): Payer: Self-pay

## 2010-09-10 ENCOUNTER — Ambulatory Visit (INDEPENDENT_AMBULATORY_CARE_PROVIDER_SITE_OTHER): Payer: Medicare Other | Admitting: *Deleted

## 2010-09-10 DIAGNOSIS — I4891 Unspecified atrial fibrillation: Secondary | ICD-10-CM

## 2010-09-10 LAB — POCT INR: INR: 3

## 2010-09-11 ENCOUNTER — Encounter (HOSPITAL_COMMUNITY): Payer: Self-pay

## 2010-09-14 ENCOUNTER — Encounter (HOSPITAL_COMMUNITY): Payer: Self-pay

## 2010-09-16 ENCOUNTER — Encounter (HOSPITAL_COMMUNITY): Payer: Self-pay

## 2010-09-18 ENCOUNTER — Encounter (HOSPITAL_COMMUNITY): Payer: Self-pay

## 2010-09-21 ENCOUNTER — Encounter (HOSPITAL_COMMUNITY): Payer: Self-pay | Attending: Internal Medicine

## 2010-09-21 DIAGNOSIS — I2510 Atherosclerotic heart disease of native coronary artery without angina pectoris: Secondary | ICD-10-CM | POA: Insufficient documentation

## 2010-09-21 DIAGNOSIS — I10 Essential (primary) hypertension: Secondary | ICD-10-CM | POA: Insufficient documentation

## 2010-09-21 DIAGNOSIS — E119 Type 2 diabetes mellitus without complications: Secondary | ICD-10-CM | POA: Insufficient documentation

## 2010-09-21 DIAGNOSIS — E7800 Pure hypercholesterolemia, unspecified: Secondary | ICD-10-CM | POA: Insufficient documentation

## 2010-09-21 DIAGNOSIS — Z951 Presence of aortocoronary bypass graft: Secondary | ICD-10-CM | POA: Insufficient documentation

## 2010-09-21 DIAGNOSIS — Z5189 Encounter for other specified aftercare: Secondary | ICD-10-CM | POA: Insufficient documentation

## 2010-09-21 DIAGNOSIS — Z8249 Family history of ischemic heart disease and other diseases of the circulatory system: Secondary | ICD-10-CM | POA: Insufficient documentation

## 2010-09-23 ENCOUNTER — Encounter (HOSPITAL_COMMUNITY): Payer: Self-pay

## 2010-09-25 ENCOUNTER — Encounter (HOSPITAL_COMMUNITY): Payer: Self-pay

## 2010-09-28 ENCOUNTER — Encounter (HOSPITAL_COMMUNITY): Payer: Self-pay

## 2010-09-30 ENCOUNTER — Encounter (HOSPITAL_COMMUNITY): Payer: Self-pay

## 2010-10-02 ENCOUNTER — Encounter (HOSPITAL_COMMUNITY): Payer: Self-pay

## 2010-10-05 ENCOUNTER — Encounter (HOSPITAL_COMMUNITY): Payer: Self-pay

## 2010-10-07 ENCOUNTER — Encounter (HOSPITAL_COMMUNITY): Payer: Self-pay

## 2010-10-08 ENCOUNTER — Ambulatory Visit (INDEPENDENT_AMBULATORY_CARE_PROVIDER_SITE_OTHER): Payer: Medicare Other | Admitting: *Deleted

## 2010-10-08 DIAGNOSIS — I4891 Unspecified atrial fibrillation: Secondary | ICD-10-CM

## 2010-10-08 LAB — POCT INR: INR: 2.3

## 2010-10-09 ENCOUNTER — Encounter (HOSPITAL_COMMUNITY): Payer: Self-pay

## 2010-10-12 ENCOUNTER — Encounter (HOSPITAL_COMMUNITY): Payer: Self-pay

## 2010-10-14 ENCOUNTER — Encounter (HOSPITAL_COMMUNITY): Payer: Self-pay

## 2010-10-15 ENCOUNTER — Ambulatory Visit (INDEPENDENT_AMBULATORY_CARE_PROVIDER_SITE_OTHER): Payer: Medicare Other | Admitting: *Deleted

## 2010-10-15 DIAGNOSIS — Z950 Presence of cardiac pacemaker: Secondary | ICD-10-CM

## 2010-10-15 DIAGNOSIS — I4891 Unspecified atrial fibrillation: Secondary | ICD-10-CM

## 2010-10-16 ENCOUNTER — Encounter (HOSPITAL_COMMUNITY): Payer: Self-pay

## 2010-10-16 ENCOUNTER — Other Ambulatory Visit: Payer: Self-pay | Admitting: Internal Medicine

## 2010-10-16 ENCOUNTER — Encounter: Payer: Self-pay | Admitting: Internal Medicine

## 2010-10-16 LAB — REMOTE PACEMAKER DEVICE
BATTERY VOLTAGE: 2.99 V
BRDY-0002RV: 60 {beats}/min
BRDY-0004RV: 120 {beats}/min
DEVICE MODEL PM: 2574597

## 2010-10-19 ENCOUNTER — Encounter (HOSPITAL_COMMUNITY): Payer: Self-pay | Attending: Internal Medicine

## 2010-10-19 DIAGNOSIS — Z951 Presence of aortocoronary bypass graft: Secondary | ICD-10-CM | POA: Insufficient documentation

## 2010-10-19 DIAGNOSIS — Z5189 Encounter for other specified aftercare: Secondary | ICD-10-CM | POA: Insufficient documentation

## 2010-10-19 DIAGNOSIS — E119 Type 2 diabetes mellitus without complications: Secondary | ICD-10-CM | POA: Insufficient documentation

## 2010-10-19 DIAGNOSIS — I2510 Atherosclerotic heart disease of native coronary artery without angina pectoris: Secondary | ICD-10-CM | POA: Insufficient documentation

## 2010-10-19 DIAGNOSIS — I10 Essential (primary) hypertension: Secondary | ICD-10-CM | POA: Insufficient documentation

## 2010-10-19 DIAGNOSIS — Z8249 Family history of ischemic heart disease and other diseases of the circulatory system: Secondary | ICD-10-CM | POA: Insufficient documentation

## 2010-10-19 DIAGNOSIS — E7800 Pure hypercholesterolemia, unspecified: Secondary | ICD-10-CM | POA: Insufficient documentation

## 2010-10-21 ENCOUNTER — Encounter: Payer: Self-pay | Admitting: *Deleted

## 2010-10-21 ENCOUNTER — Encounter (HOSPITAL_COMMUNITY): Payer: Self-pay

## 2010-10-22 NOTE — Progress Notes (Signed)
Pacer remote check  

## 2010-10-23 ENCOUNTER — Encounter (HOSPITAL_COMMUNITY): Payer: Self-pay

## 2010-10-26 ENCOUNTER — Encounter (HOSPITAL_COMMUNITY): Payer: Self-pay

## 2010-10-28 ENCOUNTER — Encounter (HOSPITAL_COMMUNITY): Payer: Self-pay

## 2010-10-30 ENCOUNTER — Encounter (HOSPITAL_COMMUNITY): Payer: Self-pay

## 2010-11-02 ENCOUNTER — Encounter (HOSPITAL_COMMUNITY): Payer: Self-pay

## 2010-11-04 ENCOUNTER — Encounter (HOSPITAL_COMMUNITY): Payer: Self-pay

## 2010-11-06 ENCOUNTER — Encounter (HOSPITAL_COMMUNITY): Payer: Self-pay

## 2010-11-09 ENCOUNTER — Encounter (HOSPITAL_COMMUNITY): Payer: Self-pay

## 2010-11-11 ENCOUNTER — Encounter (HOSPITAL_COMMUNITY): Payer: Self-pay

## 2010-11-11 ENCOUNTER — Telehealth: Payer: Self-pay | Admitting: Internal Medicine

## 2010-11-11 NOTE — Telephone Encounter (Signed)
Call regarding referral, however the referral was not signed and dated. Please call back.

## 2010-11-11 NOTE — Telephone Encounter (Signed)
Called and lmom for Olinty to call me back

## 2010-11-12 ENCOUNTER — Ambulatory Visit (INDEPENDENT_AMBULATORY_CARE_PROVIDER_SITE_OTHER): Payer: Self-pay | Admitting: Cardiovascular Disease

## 2010-11-12 DIAGNOSIS — R0989 Other specified symptoms and signs involving the circulatory and respiratory systems: Secondary | ICD-10-CM

## 2010-11-12 DIAGNOSIS — I4891 Unspecified atrial fibrillation: Secondary | ICD-10-CM

## 2010-11-12 DIAGNOSIS — Z7901 Long term (current) use of anticoagulants: Secondary | ICD-10-CM

## 2010-11-13 ENCOUNTER — Encounter (HOSPITAL_COMMUNITY): Payer: Self-pay

## 2010-11-13 NOTE — Telephone Encounter (Signed)
Oscar Bruce has called back. Please call her.  [phone]

## 2010-11-13 NOTE — Telephone Encounter (Signed)
Spoke with Sandrea Hammond and Dr Ladona Ridgel sent and order for continuation of rehab back unsigned  She is going to fax back to me for his signature

## 2010-11-16 ENCOUNTER — Encounter (HOSPITAL_COMMUNITY): Payer: Self-pay

## 2010-11-18 ENCOUNTER — Encounter (HOSPITAL_COMMUNITY): Payer: Self-pay

## 2010-11-20 ENCOUNTER — Encounter (HOSPITAL_COMMUNITY): Payer: Self-pay

## 2010-11-20 DIAGNOSIS — E119 Type 2 diabetes mellitus without complications: Secondary | ICD-10-CM | POA: Insufficient documentation

## 2010-11-20 DIAGNOSIS — I2510 Atherosclerotic heart disease of native coronary artery without angina pectoris: Secondary | ICD-10-CM | POA: Insufficient documentation

## 2010-11-20 DIAGNOSIS — Z951 Presence of aortocoronary bypass graft: Secondary | ICD-10-CM | POA: Insufficient documentation

## 2010-11-20 DIAGNOSIS — Z8249 Family history of ischemic heart disease and other diseases of the circulatory system: Secondary | ICD-10-CM | POA: Insufficient documentation

## 2010-11-20 DIAGNOSIS — E7800 Pure hypercholesterolemia, unspecified: Secondary | ICD-10-CM | POA: Insufficient documentation

## 2010-11-20 DIAGNOSIS — Z5189 Encounter for other specified aftercare: Secondary | ICD-10-CM | POA: Insufficient documentation

## 2010-11-20 DIAGNOSIS — I10 Essential (primary) hypertension: Secondary | ICD-10-CM | POA: Insufficient documentation

## 2010-11-23 ENCOUNTER — Encounter (HOSPITAL_COMMUNITY): Payer: Self-pay

## 2010-11-25 ENCOUNTER — Encounter (HOSPITAL_COMMUNITY): Payer: Self-pay

## 2010-11-27 ENCOUNTER — Encounter (HOSPITAL_COMMUNITY): Payer: Self-pay

## 2010-11-30 ENCOUNTER — Encounter (HOSPITAL_COMMUNITY): Payer: Self-pay

## 2010-12-02 ENCOUNTER — Encounter (HOSPITAL_COMMUNITY): Payer: Self-pay

## 2010-12-04 ENCOUNTER — Encounter (HOSPITAL_COMMUNITY)
Admission: RE | Admit: 2010-12-04 | Discharge: 2010-12-04 | Disposition: A | Discharge status: Home / Self Care | Payer: Self-pay | Source: Ambulatory Visit | Attending: Internal Medicine | Admitting: Internal Medicine

## 2010-12-07 ENCOUNTER — Encounter (HOSPITAL_COMMUNITY)
Admission: RE | Admit: 2010-12-07 | Discharge: 2010-12-07 | Disposition: A | Discharge status: Home / Self Care | Payer: Self-pay | Source: Ambulatory Visit | Attending: Internal Medicine | Admitting: Internal Medicine

## 2010-12-09 ENCOUNTER — Encounter (HOSPITAL_COMMUNITY): Payer: Self-pay

## 2010-12-11 ENCOUNTER — Emergency Department (INDEPENDENT_AMBULATORY_CARE_PROVIDER_SITE_OTHER)
Admission: EM | Admit: 2010-12-11 | Discharge: 2010-12-11 | Disposition: A | Discharge status: Home / Self Care | Payer: Medicare Other | Source: Home / Self Care | Attending: Family Medicine | Admitting: Family Medicine

## 2010-12-11 ENCOUNTER — Encounter (HOSPITAL_COMMUNITY): Payer: Self-pay

## 2010-12-11 ENCOUNTER — Encounter (HOSPITAL_COMMUNITY): Payer: Self-pay | Admitting: Cardiology

## 2010-12-11 DIAGNOSIS — I872 Venous insufficiency (chronic) (peripheral): Secondary | ICD-10-CM

## 2010-12-11 DIAGNOSIS — M7989 Other specified soft tissue disorders: Secondary | ICD-10-CM

## 2010-12-11 DIAGNOSIS — I878 Other specified disorders of veins: Secondary | ICD-10-CM

## 2010-12-11 MED ORDER — HYDROCODONE-ACETAMINOPHEN 5-325 MG PO TABS: ORAL_TABLET | ORAL | Status: AC

## 2010-12-11 NOTE — ED Provider Notes (Signed)
History     CSN: [id number] Arrival date & time: 12/11/2010 10:01 AM   First MD Initiated Contact with Patient 12/11/10 1018      Chief Complaint  Patient presents with  . Foot Pain    (Consider location/radiation/quality/duration/timing/severity/associated sxs/prior treatment) HPI Comments: Oscar Bruce presents for evaluation of pain in his LEFT foot that started suddenly on Wednesday. He notes increased swelling over that time with tenderness over his heel, 1st MTP joint, and medial ankle. He reports mild improvement in the swelling and pain after taking a Lasix. He has some mild chronic swelling in both legs, RIGHT greater than LEFT secondary to CABG vein harvest in his RIGHT leg.  Patient is a 77 y.o. male presenting with lower extremity pain. The history is provided by the patient and a relative.  Foot Pain This is a new problem. The current episode started 2 days ago. The problem occurs constantly. The problem has been gradually worsening. The symptoms are aggravated by walking, exertion and standing. Relieved by: diuretic. The treatment provided mild relief.    Past Medical History  Diagnosis Date  . A-fib   . Hypertension   . CAD (coronary artery disease)   . Diabetes mellitus     Past Surgical History  Procedure Date  . Cholecystectomy   . Coronary artery bypass graft     No family history on file.  History  Substance Use Topics  . Smoking status: Never Smoker   . Smokeless tobacco: Not on file  . Alcohol Use: No      Review of Systems  Constitutional: Negative.   HENT: Negative.   Eyes: Negative.   Respiratory: Negative.   Cardiovascular: Negative.   Gastrointestinal: Negative.   Genitourinary: Negative.   Musculoskeletal: Positive for myalgias, joint swelling and gait problem.    Allergies  Penicillins  Home Medications   Current Outpatient Rx  Name Route Sig Dispense Refill  . ACETAMINOPHEN 500 MG PO CHEW Oral Chew 500 mg by mouth every 6 (six)  hours as needed.      . ATORVASTATIN CALCIUM 40 MG PO TABS Oral Take 40 mg by mouth. 1/2 tab daily     . CLONIDINE HCL 0.2 MG PO TABS Oral Take 0.2 mg by mouth. 1 tab qd      . DIGOXIN 0.25 MG PO TABS  250 mcg. Take 1 1/2 tab qd     . BYETTA 5 MCG PEN [place] Subcutaneous Inject into the skin. Inject 5mg  two times a day     . FUROSEMIDE 40 MG PO TABS Oral Take 40 mg by mouth daily as needed.     Marland Kitchen GLIPIZIDE 5 MG PO TABS Oral Take 5 mg by mouth 2 (two) times daily before a meal.      . METOPROLOL SUCCINATE 50 MG PO TB24 Oral Take 50 mg by mouth daily.      Marland Kitchen RAMIPRIL 5 MG PO CAPS Oral Take 5 mg by mouth daily.      Marland Kitchen MAALOX ANTI-GAS PO Oral Take by mouth. prn     . TRIAMTERENE-HCTZ 37.5-25 MG PO TABS Oral Take by mouth. Take 1/2 tab qd      . WARFARIN SODIUM 5 MG PO TABS Oral Take by mouth as directed.      Marland Kitchen CLINDAMYCIN HCL 150 MG PO CAPS Oral Take 150 mg by mouth 4 (four) times daily. Before dental appt     . ONE-DAILY MULTI VITAMINS PO TABS Oral Take 1 tablet by mouth daily.      Marland Kitchen  NITROGLYCERIN 0.4 MG SL SUBL Sublingual Place 0.4 mg under the tongue every 5 (five) minutes as needed.      Marland Kitchen PIOGLITAZONE HCL 15 MG PO TABS Oral Take 15 mg by mouth daily.        BP 153/64  Pulse 63  Temp(Src) 98.3 F (36.8 C) (Oral)  Resp 20  SpO2 99%  Physical Exam  Constitutional: He is oriented to person, place, and time. He appears well-developed and well-nourished.  HENT:  Head: Normocephalic and atraumatic.  Neck: Normal range of motion.  Pulmonary/Chest: Effort normal.  Musculoskeletal:       Left foot: He exhibits tenderness and swelling. He exhibits normal range of motion and no bony tenderness.       Feet:  Neurological: He is alert and oriented to person, place, and time.  Skin: Skin is warm and dry. There is erythema.    ED Course  Procedures (including critical care time)  Labs Reviewed - No data to display No results found.   No diagnosis found.    MDM           Richardo Priest, MD 12/11/10 1120

## 2010-12-11 NOTE — ED Notes (Signed)
Pt reports pain to left foot. The pain started this past Monday in his toes. The heel started hurting Tuesday. The ankle started hurting Wednesday.  Pt denies injury. Toes noted to be warm to touch and pink. Redness noted to medial aspect of left foot up to ankle. Pt denies pain when he lays in bed with his foot up. Pt reports taking an extra lasix tablet yesterday and the swelling went down enough to put his shoes on. He reported that he had tenderness while the foot was swollen and had relief after taking the lasix.

## 2010-12-14 ENCOUNTER — Encounter (HOSPITAL_COMMUNITY)
Admission: RE | Admit: 2010-12-14 | Discharge: 2010-12-14 | Disposition: A | Discharge status: Home / Self Care | Payer: Self-pay | Source: Ambulatory Visit | Attending: Internal Medicine | Admitting: Internal Medicine

## 2010-12-16 ENCOUNTER — Encounter (HOSPITAL_COMMUNITY)
Admission: RE | Admit: 2010-12-16 | Discharge: 2010-12-16 | Disposition: A | Discharge status: Home / Self Care | Payer: Self-pay | Source: Ambulatory Visit | Attending: Internal Medicine | Admitting: Internal Medicine

## 2010-12-18 ENCOUNTER — Encounter (HOSPITAL_COMMUNITY)
Admission: RE | Admit: 2010-12-18 | Discharge: 2010-12-18 | Disposition: A | Discharge status: Home / Self Care | Payer: Self-pay | Source: Ambulatory Visit | Attending: Internal Medicine | Admitting: Internal Medicine

## 2010-12-21 ENCOUNTER — Encounter (HOSPITAL_COMMUNITY)
Admission: RE | Admit: 2010-12-21 | Discharge: 2010-12-21 | Disposition: A | Discharge status: Home / Self Care | Payer: Self-pay | Source: Ambulatory Visit | Attending: Internal Medicine | Admitting: Internal Medicine

## 2010-12-21 DIAGNOSIS — E119 Type 2 diabetes mellitus without complications: Secondary | ICD-10-CM | POA: Insufficient documentation

## 2010-12-21 DIAGNOSIS — I10 Essential (primary) hypertension: Secondary | ICD-10-CM | POA: Insufficient documentation

## 2010-12-21 DIAGNOSIS — I2510 Atherosclerotic heart disease of native coronary artery without angina pectoris: Secondary | ICD-10-CM | POA: Insufficient documentation

## 2010-12-21 DIAGNOSIS — E7800 Pure hypercholesterolemia, unspecified: Secondary | ICD-10-CM | POA: Insufficient documentation

## 2010-12-21 DIAGNOSIS — Z5189 Encounter for other specified aftercare: Secondary | ICD-10-CM | POA: Insufficient documentation

## 2010-12-21 DIAGNOSIS — Z8249 Family history of ischemic heart disease and other diseases of the circulatory system: Secondary | ICD-10-CM | POA: Insufficient documentation

## 2010-12-21 DIAGNOSIS — Z951 Presence of aortocoronary bypass graft: Secondary | ICD-10-CM | POA: Insufficient documentation

## 2010-12-23 ENCOUNTER — Encounter (HOSPITAL_COMMUNITY)
Admission: RE | Admit: 2010-12-23 | Discharge: 2010-12-23 | Disposition: A | Discharge status: Home / Self Care | Payer: Self-pay | Source: Ambulatory Visit | Attending: Internal Medicine | Admitting: Internal Medicine

## 2010-12-24 ENCOUNTER — Ambulatory Visit (INDEPENDENT_AMBULATORY_CARE_PROVIDER_SITE_OTHER): Payer: Medicare Other | Admitting: *Deleted

## 2010-12-24 DIAGNOSIS — Z7901 Long term (current) use of anticoagulants: Secondary | ICD-10-CM

## 2010-12-24 DIAGNOSIS — I4891 Unspecified atrial fibrillation: Secondary | ICD-10-CM

## 2010-12-25 ENCOUNTER — Encounter (HOSPITAL_COMMUNITY)
Admission: RE | Admit: 2010-12-25 | Discharge: 2010-12-25 | Disposition: A | Discharge status: Home / Self Care | Payer: Self-pay | Source: Ambulatory Visit | Attending: Internal Medicine | Admitting: Internal Medicine

## 2010-12-28 ENCOUNTER — Encounter (HOSPITAL_COMMUNITY)
Admission: RE | Admit: 2010-12-28 | Discharge: 2010-12-28 | Disposition: A | Discharge status: Home / Self Care | Payer: Self-pay | Source: Ambulatory Visit | Attending: Internal Medicine | Admitting: Internal Medicine

## 2010-12-30 ENCOUNTER — Encounter (HOSPITAL_COMMUNITY)
Admission: RE | Admit: 2010-12-30 | Discharge: 2010-12-30 | Disposition: A | Discharge status: Home / Self Care | Payer: Self-pay | Source: Ambulatory Visit | Attending: Internal Medicine | Admitting: Internal Medicine

## 2011-01-01 ENCOUNTER — Encounter (HOSPITAL_COMMUNITY)
Admission: RE | Admit: 2011-01-01 | Discharge: 2011-01-01 | Disposition: A | Discharge status: Home / Self Care | Payer: Self-pay | Source: Ambulatory Visit | Attending: Internal Medicine | Admitting: Internal Medicine

## 2011-01-04 ENCOUNTER — Encounter (HOSPITAL_COMMUNITY)
Admission: RE | Admit: 2011-01-04 | Discharge: 2011-01-04 | Disposition: A | Discharge status: Home / Self Care | Payer: Self-pay | Source: Ambulatory Visit | Attending: Internal Medicine | Admitting: Internal Medicine

## 2011-01-06 ENCOUNTER — Encounter (HOSPITAL_COMMUNITY): Payer: Self-pay

## 2011-01-08 ENCOUNTER — Encounter (HOSPITAL_COMMUNITY): Payer: Self-pay

## 2011-01-11 ENCOUNTER — Encounter (HOSPITAL_COMMUNITY): Payer: Self-pay

## 2011-01-13 ENCOUNTER — Encounter (HOSPITAL_COMMUNITY): Payer: Self-pay

## 2011-01-14 ENCOUNTER — Other Ambulatory Visit: Payer: Self-pay | Admitting: Internal Medicine

## 2011-01-14 ENCOUNTER — Encounter: Payer: Self-pay | Admitting: Internal Medicine

## 2011-01-14 ENCOUNTER — Ambulatory Visit (INDEPENDENT_AMBULATORY_CARE_PROVIDER_SITE_OTHER): Payer: Medicare Other | Admitting: *Deleted

## 2011-01-14 DIAGNOSIS — I4891 Unspecified atrial fibrillation: Secondary | ICD-10-CM

## 2011-01-14 DIAGNOSIS — I495 Sick sinus syndrome: Secondary | ICD-10-CM

## 2011-01-14 LAB — REMOTE PACEMAKER DEVICE
BATTERY VOLTAGE: 2.99 V
DEVICE MODEL PM: 2574597
VENTRICULAR PACING PM: 100

## 2011-01-15 ENCOUNTER — Encounter (HOSPITAL_COMMUNITY): Payer: Self-pay

## 2011-01-18 ENCOUNTER — Encounter (HOSPITAL_COMMUNITY): Payer: Self-pay

## 2011-01-20 ENCOUNTER — Encounter (HOSPITAL_COMMUNITY): Payer: Self-pay

## 2011-01-21 NOTE — Progress Notes (Signed)
Remote pacer check  

## 2011-01-22 ENCOUNTER — Encounter (HOSPITAL_COMMUNITY)
Admission: RE | Admit: 2011-01-22 | Discharge: 2011-01-22 | Disposition: A | Discharge status: Home / Self Care | Payer: Self-pay | Source: Ambulatory Visit | Attending: Internal Medicine | Admitting: Internal Medicine

## 2011-01-22 ENCOUNTER — Ambulatory Visit (INDEPENDENT_AMBULATORY_CARE_PROVIDER_SITE_OTHER): Payer: Medicare Other | Admitting: *Deleted

## 2011-01-22 DIAGNOSIS — I2510 Atherosclerotic heart disease of native coronary artery without angina pectoris: Secondary | ICD-10-CM | POA: Insufficient documentation

## 2011-01-22 DIAGNOSIS — I10 Essential (primary) hypertension: Secondary | ICD-10-CM | POA: Insufficient documentation

## 2011-01-22 DIAGNOSIS — E7800 Pure hypercholesterolemia, unspecified: Secondary | ICD-10-CM | POA: Insufficient documentation

## 2011-01-22 DIAGNOSIS — Z8249 Family history of ischemic heart disease and other diseases of the circulatory system: Secondary | ICD-10-CM | POA: Insufficient documentation

## 2011-01-22 DIAGNOSIS — I4891 Unspecified atrial fibrillation: Secondary | ICD-10-CM

## 2011-01-22 DIAGNOSIS — Z7901 Long term (current) use of anticoagulants: Secondary | ICD-10-CM

## 2011-01-22 DIAGNOSIS — Z5189 Encounter for other specified aftercare: Secondary | ICD-10-CM | POA: Insufficient documentation

## 2011-01-22 DIAGNOSIS — E119 Type 2 diabetes mellitus without complications: Secondary | ICD-10-CM | POA: Insufficient documentation

## 2011-01-22 DIAGNOSIS — Z951 Presence of aortocoronary bypass graft: Secondary | ICD-10-CM | POA: Insufficient documentation

## 2011-01-25 ENCOUNTER — Encounter (HOSPITAL_COMMUNITY)
Admission: RE | Admit: 2011-01-25 | Discharge: 2011-01-25 | Disposition: A | Discharge status: Home / Self Care | Payer: Self-pay | Source: Ambulatory Visit | Attending: Internal Medicine | Admitting: Internal Medicine

## 2011-01-27 ENCOUNTER — Encounter (HOSPITAL_COMMUNITY)
Admission: RE | Admit: 2011-01-27 | Discharge: 2011-01-27 | Disposition: A | Discharge status: Home / Self Care | Payer: Self-pay | Source: Ambulatory Visit | Attending: Internal Medicine | Admitting: Internal Medicine

## 2011-01-29 ENCOUNTER — Encounter (HOSPITAL_COMMUNITY)
Admission: RE | Admit: 2011-01-29 | Discharge: 2011-01-29 | Disposition: A | Discharge status: Home / Self Care | Payer: Self-pay | Source: Ambulatory Visit | Attending: Internal Medicine | Admitting: Internal Medicine

## 2011-02-01 ENCOUNTER — Encounter (HOSPITAL_COMMUNITY)
Admission: RE | Admit: 2011-02-01 | Discharge: 2011-02-01 | Disposition: A | Discharge status: Home / Self Care | Payer: Self-pay | Source: Ambulatory Visit | Attending: Internal Medicine | Admitting: Internal Medicine

## 2011-02-03 ENCOUNTER — Encounter (HOSPITAL_COMMUNITY)
Admission: RE | Admit: 2011-02-03 | Discharge: 2011-02-03 | Disposition: A | Discharge status: Home / Self Care | Payer: Self-pay | Source: Ambulatory Visit | Attending: Internal Medicine | Admitting: Internal Medicine

## 2011-02-03 ENCOUNTER — Encounter: Payer: Self-pay | Admitting: *Deleted

## 2011-02-05 ENCOUNTER — Encounter (HOSPITAL_COMMUNITY): Payer: Self-pay

## 2011-02-08 ENCOUNTER — Encounter (HOSPITAL_COMMUNITY)
Admission: RE | Admit: 2011-02-08 | Discharge: 2011-02-08 | Disposition: A | Discharge status: Home / Self Care | Payer: Self-pay | Source: Ambulatory Visit | Attending: Internal Medicine | Admitting: Internal Medicine

## 2011-02-10 ENCOUNTER — Encounter (HOSPITAL_COMMUNITY)
Admission: RE | Admit: 2011-02-10 | Discharge: 2011-02-10 | Disposition: A | Discharge status: Home / Self Care | Payer: Self-pay | Source: Ambulatory Visit | Attending: Internal Medicine | Admitting: Internal Medicine

## 2011-02-12 ENCOUNTER — Encounter (HOSPITAL_COMMUNITY): Payer: Self-pay

## 2011-02-15 ENCOUNTER — Encounter (HOSPITAL_COMMUNITY)
Admission: RE | Admit: 2011-02-15 | Discharge: 2011-02-15 | Disposition: A | Discharge status: Home / Self Care | Payer: Self-pay | Source: Ambulatory Visit | Attending: Internal Medicine | Admitting: Internal Medicine

## 2011-02-16 ENCOUNTER — Encounter (HOSPITAL_COMMUNITY): Payer: Self-pay | Admitting: *Deleted

## 2011-02-16 ENCOUNTER — Emergency Department (HOSPITAL_COMMUNITY)
Admission: EM | Admit: 2011-02-16 | Discharge: 2011-02-16 | Disposition: A | Discharge status: Nursing Home (Includes ICF) | Payer: Medicare Other | Source: Home / Self Care | Attending: Emergency Medicine | Admitting: Emergency Medicine

## 2011-02-16 ENCOUNTER — Other Ambulatory Visit: Payer: Self-pay

## 2011-02-16 ENCOUNTER — Emergency Department (HOSPITAL_COMMUNITY): Payer: Medicare Other

## 2011-02-16 ENCOUNTER — Emergency Department (HOSPITAL_COMMUNITY)
Admission: EM | Admit: 2011-02-16 | Discharge: 2011-02-16 | Disposition: A | Discharge status: Home / Self Care | Payer: Medicare Other | Attending: Emergency Medicine | Admitting: Emergency Medicine

## 2011-02-16 DIAGNOSIS — W19XXXA Unspecified fall, initial encounter: Secondary | ICD-10-CM | POA: Insufficient documentation

## 2011-02-16 DIAGNOSIS — E119 Type 2 diabetes mellitus without complications: Secondary | ICD-10-CM | POA: Insufficient documentation

## 2011-02-16 DIAGNOSIS — Z7901 Long term (current) use of anticoagulants: Secondary | ICD-10-CM | POA: Insufficient documentation

## 2011-02-16 DIAGNOSIS — R071 Chest pain on breathing: Secondary | ICD-10-CM | POA: Insufficient documentation

## 2011-02-16 DIAGNOSIS — R0789 Other chest pain: Secondary | ICD-10-CM

## 2011-02-16 DIAGNOSIS — I4891 Unspecified atrial fibrillation: Secondary | ICD-10-CM | POA: Insufficient documentation

## 2011-02-16 DIAGNOSIS — Z79899 Other long term (current) drug therapy: Secondary | ICD-10-CM | POA: Insufficient documentation

## 2011-02-16 DIAGNOSIS — I2510 Atherosclerotic heart disease of native coronary artery without angina pectoris: Secondary | ICD-10-CM | POA: Insufficient documentation

## 2011-02-16 DIAGNOSIS — I10 Essential (primary) hypertension: Secondary | ICD-10-CM | POA: Insufficient documentation

## 2011-02-16 DIAGNOSIS — Z950 Presence of cardiac pacemaker: Secondary | ICD-10-CM | POA: Insufficient documentation

## 2011-02-16 NOTE — ED Notes (Signed)
Pt is on coumadin for afib and has pacer.  Lungs are clear and pt has pain with deep breath.  Pt in nad.  Pt is on the monitor with controlled rate

## 2011-02-16 NOTE — ED Provider Notes (Signed)
History     CSN: [id number]  Arrival date & time 02/16/11  1003   First MD Initiated Contact with Patient 02/16/11 1029      Chief Complaint  Patient presents with  . Chest Pain    (Consider location/radiation/quality/duration/timing/severity/associated sxs/prior treatment) HPI Comments: Tripped on a tree root, yesterday, fell backwards I never hit my head", Woke up this morning with this sharp pain on my chest (L precordial) also my shoulder hurts" "hurst when i take a deep breath or when i move". No nausea  Patient is a 78 y.o. male presenting with chest pain. The history is provided by the patient.  Chest Pain The chest pain began yesterday. Chest pain occurs constantly. The chest pain is unchanged. The pain is associated with breathing, coughing and lifting. The quality of the pain is described as aching and sharp. The pain radiates to the upper back. Chest pain is worsened by certain positions and deep breathing. Pertinent negatives for primary symptoms include no fever, no fatigue, no shortness of breath, no cough, no wheezing, no palpitations, no vomiting and no dizziness.  Pertinent negatives for associated symptoms include no diaphoresis. He tried nothing for the symptoms. Risk factors include no known risk factors.     Past Medical History  Diagnosis Date  . A-fib   . Hypertension   . CAD (coronary artery disease)   . Diabetes mellitus     Past Surgical History  Procedure Date  . Cholecystectomy   . Coronary artery bypass graft   . Pacemaker insertion     Family History  Problem Relation Age of Onset  . Diabetes Mother   . Coronary artery disease Father   . Diabetes Sister     History  Substance Use Topics  . Smoking status: Never Smoker   . Smokeless tobacco: Not on file  . Alcohol Use: No      Review of Systems  Constitutional: Negative for fever, diaphoresis and fatigue.  Respiratory: Negative for cough, shortness of breath and wheezing.     Cardiovascular: Positive for chest pain. Negative for palpitations.  Gastrointestinal: Negative for vomiting.  Neurological: Negative for dizziness.    Allergies  Penicillins  Home Medications   Current Outpatient Rx  Name Route Sig Dispense Refill  . ACETAMINOPHEN 500 MG PO CHEW Oral Chew 500 mg by mouth every 6 (six) hours as needed.      . ATORVASTATIN CALCIUM 40 MG PO TABS Oral Take 40 mg by mouth. 1/2 tab daily     . CLONIDINE HCL 0.2 MG PO TABS Oral Take 0.2 mg by mouth. 1 tab qd      . DIGOXIN 0.25 MG PO TABS  250 mcg. Take 1 1/2 tab qd     . BYETTA 5 MCG PEN [place] Subcutaneous Inject into the skin. Inject 5mg  two times a day     . GLIPIZIDE 5 MG PO TABS Oral Take 5 mg by mouth 2 (two) times daily before a meal.      . METOPROLOL SUCCINATE ER 50 MG PO TB24 Oral Take 50 mg by mouth daily.      Marland Kitchen ONE-DAILY MULTI VITAMINS PO TABS Oral Take 1 tablet by mouth daily.      Marland Kitchen RAMIPRIL 5 MG PO CAPS Oral Take 5 mg by mouth daily.      Marland Kitchen MAALOX ANTI-GAS PO Oral Take by mouth. prn     . TRIAMTERENE-HCTZ 37.5-25 MG PO TABS Oral Take by mouth. Take 1/2 tab qd      .  WARFARIN SODIUM 5 MG PO TABS Oral Take by mouth as directed.      Marland Kitchen CLINDAMYCIN HCL 150 MG PO CAPS Oral Take 150 mg by mouth 4 (four) times daily. Before dental appt     . FUROSEMIDE 40 MG PO TABS Oral Take 40 mg by mouth daily as needed.     Marland Kitchen NITROGLYCERIN 0.4 MG SL SUBL Sublingual Place 0.4 mg under the tongue every 5 (five) minutes as needed.        BP 170/77  Pulse 72  Temp(Src) 98 F (36.7 C) (Oral)  Resp 20  SpO2 96%  Physical Exam  Nursing note and vitals reviewed. Constitutional: He appears well-developed and well-nourished.  Eyes: Conjunctivae are normal.  Neck: Neck supple.  Pulmonary/Chest: Effort normal. He has no decreased breath sounds. He exhibits tenderness and bony tenderness. He exhibits no edema, no deformity and no swelling.    Skin: Skin is warm. No erythema.    ED Course  Procedures  (including critical care time)  Labs Reviewed - No data to display No results found.   1. Chest pain       MDM  Post-fall > 24 hours, with new onset L anterior CP, significant cardiovascular history, abnormal EKG transferred to the ED for further evaluation EKG: {ekg findings:315101::"lateral and inferior chronic ischemia, LAD and fascicular blocks     Jimmie Molly, MD 02/16/11 1128

## 2011-02-16 NOTE — ED Notes (Signed)
Patient reports he had a fall yesterday.  Today he had onset of chest pain that increases with any movement.  Patient was sent for further eval of his chest pain

## 2011-02-16 NOTE — ED Notes (Signed)
Pt reports he tripped on a tree root yesterday and fell in his yard backwards and the left side.  He did not c/o pain yesterday, but woke this AM with left anterior chest and left shoulder , sharp pain that is worse with movement or a deep breath.  Pt is alert, no diaphoresis or indigestion

## 2011-02-16 NOTE — ED Notes (Signed)
[id number] 

## 2011-02-16 NOTE — ED Provider Notes (Signed)
History     CSN: [id number]  Arrival date & time 02/16/11  1111   First MD Initiated Contact with Patient 02/16/11 1148      Chief Complaint  Patient presents with  . Fall  . Chest Pain    (Consider location/radiation/quality/duration/timing/severity/associated sxs/prior treatment) Patient is a 78 y.o. male presenting with chest pain. The history is provided by the patient.  Chest Pain The chest pain began 3 - 5 hours ago. Chest pain occurs constantly. The chest pain is improving. The pain is associated with breathing and lifting (moving). The severity of the pain is moderate. The quality of the pain is described as sharp. The pain does not radiate. Chest pain is worsened by deep breathing. Pertinent negatives for primary symptoms include no fever, no shortness of breath, no cough, no abdominal pain, no nausea, no vomiting and no dizziness.  Pertinent negatives for associated symptoms include no diaphoresis. Treatments tried: tylenol.  His past medical history is significant for CAD.     Past Medical History  Diagnosis Date  . A-fib   . Hypertension   . CAD (coronary artery disease)   . Diabetes mellitus     Past Surgical History  Procedure Date  . Cholecystectomy   . Coronary artery bypass graft   . Pacemaker insertion     Family History  Problem Relation Age of Onset  . Diabetes Mother   . Coronary artery disease Father   . Diabetes Sister     History  Substance Use Topics  . Smoking status: Never Smoker   . Smokeless tobacco: Not on file  . Alcohol Use: No      Review of Systems  Constitutional: Negative for fever and diaphoresis.  HENT: Negative for congestion, facial swelling and trouble swallowing.   Respiratory: Negative for cough and shortness of breath.   Cardiovascular: Positive for chest pain.  Gastrointestinal: Negative for nausea, vomiting, abdominal pain and diarrhea.  Genitourinary: Negative for difficulty urinating.  Skin: Negative for  rash.  Neurological: Negative for dizziness.  All other systems reviewed and are negative.    Allergies  Penicillins  Home Medications   Current Outpatient Rx  Name Route Sig Dispense Refill  . ACETAMINOPHEN 500 MG PO CHEW Oral Chew 500 mg by mouth every 6 (six) hours as needed. For pain    . ATORVASTATIN CALCIUM 40 MG PO TABS Oral Take 20 mg by mouth.     . CLONIDINE HCL 0.2 MG PO TABS Oral Take 0.4 mg by mouth every evening.     Marland Kitchen DIGOXIN 0.25 MG PO TABS Oral Take 250 mcg by mouth daily.     Marland Kitchen BYETTA 5 MCG PEN [place] Subcutaneous Inject 5 mg into the skin 2 (two) times daily.     Marland Kitchen GLIPIZIDE 5 MG PO TABS Oral Take 5 mg by mouth 2 (two) times daily before a meal.      . METOPROLOL SUCCINATE ER 50 MG PO TB24 Oral Take 50 mg by mouth daily.      Marland Kitchen ONE-DAILY MULTI VITAMINS PO TABS Oral Take 1 tablet by mouth daily.      Marland Kitchen NITROGLYCERIN 0.4 MG SL SUBL Sublingual Place 0.4 mg under the tongue every 5 (five) minutes as needed.      Marland Kitchen RAMIPRIL 5 MG PO CAPS Oral Take 5 mg by mouth daily.      . TRIAMTERENE-HCTZ 37.5-25 MG PO TABS Oral Take 1 tablet by mouth daily.     . WARFARIN SODIUM 5  MG PO TABS Oral Take 2.5-5 mg by mouth daily. Takes 5mg  daily except on tue and thur takes 2.5mg       BP 156/61  Pulse 62  Temp(Src) 98.2 F (36.8 C) (Oral)  Resp 18  Ht 5\' 8"  (1.727 m)  Wt 180 lb (81.647 kg)  BMI 27.37 kg/m2  SpO2 98%  Physical Exam  Nursing note and vitals reviewed. Constitutional: He is oriented to person, place, and time. He appears well-developed and well-nourished. No distress.  HENT:  Head: Normocephalic and atraumatic.  Mouth/Throat: Oropharynx is clear and moist.  Eyes: Conjunctivae are normal. Pupils are equal, round, and reactive to light. No scleral icterus.  Neck: Normal range of motion. Neck supple.  Cardiovascular: Normal rate, regular rhythm, normal heart sounds and intact distal pulses.   No murmur heard.      Sternotomy scar.  Right upper chest pacemaker  palpable.  Pulmonary/Chest: Effort normal and breath sounds normal. No stridor. No respiratory distress. He has no wheezes. He has no rales.       Left anterior chest wall tenderness to palpation  Abdominal: Soft. He exhibits no distension. There is no tenderness.  Musculoskeletal: Normal range of motion. He exhibits no edema.  Neurological: He is alert and oriented to person, place, and time.  Skin: Skin is warm and dry. No rash noted.  Psychiatric: He has a normal mood and affect. His behavior is normal.    ED Course  Procedures (including critical care time)  Labs Reviewed - No data to display Dg Chest 2 View  02/16/2011  *RADIOLOGY REPORT*  Clinical Data: Fall.  Left anterior chest pain.  Pacemaker.  CHEST - 2 VIEW  Comparison: 01/07/2010.  Findings: The cardiopericardial silhouette is unchanged.  Median sternotomy / CABG.  Right subclavian cardiac pacemaker with leads unchanged.  There has been revision of the power pack compared to the prior examination of 01/07/2010.  Bosselation of the left hemidiaphragm.  There is no pneumothorax.  No displaced rib fracture is identified.  No pleural effusion.  IMPRESSION:  1.  No acute cardiopulmonary disease.  No pneumothorax or displaced rib fracture identified. 2.  Revision of right subclavian pacemaker power pack compared to 2011.  Original Report Authenticated By: Andreas Newport, M.D.  All radiology studies independently viewed by me.      Date: 02/16/2011  Rate: 61  Rhythm: wide QRS rhythm with probable pacer spikes evident  QRS Axis: left  Intervals: normal QTc  ST/T Wave abnormalities: nonspecific ST/T changes  Conduction Disutrbances:nonspecific intraventricular conduction delay  Narrative Interpretation:   Old EKG Reviewed: unchanged     1. Chest wall pain       MDM  78 yo male with hx of CAD s/p CABG presenting with chest pain in context of a fall yesterday.  Describes sharp pain with movement, palpation, or deep breathing.   Eval'd at urgent care and sent to ED secondary to risk profile.  EKG shows wide rhythm without signs of obvious ischemia, similar to EKG from 2004.  Pt has no pain except when moving.  Pain is reproducible with palpation.  He has no symptoms consistent with ACS.  Pt is well appearing with stable vitals.  Will check CXR.    CXR negative.  Will dc home with return precautions and PCP follow up.         Warnell Forester, MD 02/16/11 [phone]

## 2011-02-16 NOTE — ED Notes (Signed)
Pt stable and resting.

## 2011-02-17 ENCOUNTER — Encounter (HOSPITAL_COMMUNITY)
Admission: RE | Admit: 2011-02-17 | Discharge: 2011-02-17 | Disposition: A | Discharge status: Home / Self Care | Payer: Self-pay | Source: Ambulatory Visit | Attending: Internal Medicine | Admitting: Internal Medicine

## 2011-02-18 NOTE — ED Provider Notes (Signed)
78 y.o. Male with sharp anterior chest paint that occurs only with movement of left shoulder.  Patient had fall yesterday and noted pain this a.m. After awakening.  Patient without symptoms concerning for acs or mi. I examined the patient, reviewed findings, and discussed with Dr. Loretha Stapler and patient.  Hilario Quarry, MD 02/18/11 1141

## 2011-02-19 ENCOUNTER — Ambulatory Visit (INDEPENDENT_AMBULATORY_CARE_PROVIDER_SITE_OTHER): Payer: Medicare Other | Admitting: Pharmacist

## 2011-02-19 ENCOUNTER — Encounter (HOSPITAL_COMMUNITY)
Admission: RE | Admit: 2011-02-19 | Discharge: 2011-02-19 | Disposition: A | Discharge status: Home / Self Care | Payer: Self-pay | Source: Ambulatory Visit | Attending: Internal Medicine | Admitting: Internal Medicine

## 2011-02-19 DIAGNOSIS — I2510 Atherosclerotic heart disease of native coronary artery without angina pectoris: Secondary | ICD-10-CM | POA: Insufficient documentation

## 2011-02-19 DIAGNOSIS — Z5189 Encounter for other specified aftercare: Secondary | ICD-10-CM | POA: Insufficient documentation

## 2011-02-19 DIAGNOSIS — Z951 Presence of aortocoronary bypass graft: Secondary | ICD-10-CM | POA: Insufficient documentation

## 2011-02-19 DIAGNOSIS — I4891 Unspecified atrial fibrillation: Secondary | ICD-10-CM

## 2011-02-19 DIAGNOSIS — E119 Type 2 diabetes mellitus without complications: Secondary | ICD-10-CM | POA: Insufficient documentation

## 2011-02-19 DIAGNOSIS — E7800 Pure hypercholesterolemia, unspecified: Secondary | ICD-10-CM | POA: Insufficient documentation

## 2011-02-19 DIAGNOSIS — I10 Essential (primary) hypertension: Secondary | ICD-10-CM | POA: Insufficient documentation

## 2011-02-19 DIAGNOSIS — Z7901 Long term (current) use of anticoagulants: Secondary | ICD-10-CM

## 2011-02-19 DIAGNOSIS — Z8249 Family history of ischemic heart disease and other diseases of the circulatory system: Secondary | ICD-10-CM | POA: Insufficient documentation

## 2011-02-19 LAB — POCT INR: INR: 2.7

## 2011-02-22 ENCOUNTER — Encounter (HOSPITAL_COMMUNITY)
Admission: RE | Admit: 2011-02-22 | Discharge: 2011-02-22 | Disposition: A | Discharge status: Home / Self Care | Payer: Self-pay | Source: Ambulatory Visit | Attending: Internal Medicine | Admitting: Internal Medicine

## 2011-02-24 ENCOUNTER — Encounter (HOSPITAL_COMMUNITY)
Admission: RE | Admit: 2011-02-24 | Discharge: 2011-02-24 | Disposition: A | Discharge status: Home / Self Care | Payer: Self-pay | Source: Ambulatory Visit | Attending: Internal Medicine | Admitting: Internal Medicine

## 2011-02-26 ENCOUNTER — Encounter (HOSPITAL_COMMUNITY)
Admission: RE | Admit: 2011-02-26 | Discharge: 2011-02-26 | Disposition: A | Discharge status: Home / Self Care | Payer: Self-pay | Source: Ambulatory Visit | Attending: Internal Medicine | Admitting: Internal Medicine

## 2011-03-01 ENCOUNTER — Encounter (HOSPITAL_COMMUNITY): Payer: Self-pay

## 2011-03-03 ENCOUNTER — Encounter (HOSPITAL_COMMUNITY)
Admission: RE | Admit: 2011-03-03 | Discharge: 2011-03-03 | Disposition: A | Discharge status: Home / Self Care | Payer: Self-pay | Source: Ambulatory Visit | Attending: Internal Medicine | Admitting: Internal Medicine

## 2011-03-05 ENCOUNTER — Encounter (HOSPITAL_COMMUNITY): Payer: Self-pay

## 2011-03-08 ENCOUNTER — Encounter (HOSPITAL_COMMUNITY)
Admission: RE | Admit: 2011-03-08 | Discharge: 2011-03-08 | Disposition: A | Discharge status: Home / Self Care | Payer: Self-pay | Source: Ambulatory Visit | Attending: Internal Medicine | Admitting: Internal Medicine

## 2011-03-10 ENCOUNTER — Encounter (HOSPITAL_COMMUNITY)
Admission: RE | Admit: 2011-03-10 | Discharge: 2011-03-10 | Disposition: A | Discharge status: Home / Self Care | Payer: Self-pay | Source: Ambulatory Visit | Attending: Internal Medicine | Admitting: Internal Medicine

## 2011-03-12 ENCOUNTER — Encounter (HOSPITAL_COMMUNITY)
Admission: RE | Admit: 2011-03-12 | Discharge: 2011-03-12 | Disposition: A | Discharge status: Home / Self Care | Payer: Self-pay | Source: Ambulatory Visit | Attending: Internal Medicine | Admitting: Internal Medicine

## 2011-03-15 ENCOUNTER — Encounter (HOSPITAL_COMMUNITY)
Admission: RE | Admit: 2011-03-15 | Discharge: 2011-03-15 | Disposition: A | Discharge status: Home / Self Care | Payer: Self-pay | Source: Ambulatory Visit | Attending: Internal Medicine | Admitting: Internal Medicine

## 2011-03-17 ENCOUNTER — Encounter (HOSPITAL_COMMUNITY)
Admission: RE | Admit: 2011-03-17 | Discharge: 2011-03-17 | Disposition: A | Discharge status: Home / Self Care | Payer: Self-pay | Source: Ambulatory Visit | Attending: Internal Medicine | Admitting: Internal Medicine

## 2011-03-19 ENCOUNTER — Encounter (HOSPITAL_COMMUNITY)
Admission: RE | Admit: 2011-03-19 | Discharge: 2011-03-19 | Disposition: A | Discharge status: Home / Self Care | Payer: Self-pay | Source: Ambulatory Visit | Attending: Internal Medicine | Admitting: Internal Medicine

## 2011-03-19 DIAGNOSIS — E119 Type 2 diabetes mellitus without complications: Secondary | ICD-10-CM | POA: Insufficient documentation

## 2011-03-19 DIAGNOSIS — Z5189 Encounter for other specified aftercare: Secondary | ICD-10-CM | POA: Insufficient documentation

## 2011-03-19 DIAGNOSIS — E7800 Pure hypercholesterolemia, unspecified: Secondary | ICD-10-CM | POA: Insufficient documentation

## 2011-03-19 DIAGNOSIS — Z951 Presence of aortocoronary bypass graft: Secondary | ICD-10-CM | POA: Insufficient documentation

## 2011-03-19 DIAGNOSIS — Z8249 Family history of ischemic heart disease and other diseases of the circulatory system: Secondary | ICD-10-CM | POA: Insufficient documentation

## 2011-03-19 DIAGNOSIS — I10 Essential (primary) hypertension: Secondary | ICD-10-CM | POA: Insufficient documentation

## 2011-03-19 DIAGNOSIS — I2510 Atherosclerotic heart disease of native coronary artery without angina pectoris: Secondary | ICD-10-CM | POA: Insufficient documentation

## 2011-03-22 ENCOUNTER — Encounter (HOSPITAL_COMMUNITY)
Admission: RE | Admit: 2011-03-22 | Discharge: 2011-03-22 | Disposition: A | Discharge status: Home / Self Care | Payer: Self-pay | Source: Ambulatory Visit | Attending: Internal Medicine | Admitting: Internal Medicine

## 2011-03-24 ENCOUNTER — Encounter (HOSPITAL_COMMUNITY)
Admission: RE | Admit: 2011-03-24 | Discharge: 2011-03-24 | Disposition: A | Discharge status: Home / Self Care | Payer: Self-pay | Source: Ambulatory Visit | Attending: Internal Medicine | Admitting: Internal Medicine

## 2011-03-26 ENCOUNTER — Encounter (HOSPITAL_COMMUNITY): Payer: Self-pay

## 2011-03-29 ENCOUNTER — Encounter (HOSPITAL_COMMUNITY)
Admission: RE | Admit: 2011-03-29 | Discharge: 2011-03-29 | Disposition: A | Discharge status: Home / Self Care | Payer: Self-pay | Source: Ambulatory Visit | Attending: Internal Medicine | Admitting: Internal Medicine

## 2011-03-31 ENCOUNTER — Encounter (HOSPITAL_COMMUNITY)
Admission: RE | Admit: 2011-03-31 | Discharge: 2011-03-31 | Disposition: A | Discharge status: Home / Self Care | Payer: Self-pay | Source: Ambulatory Visit | Attending: Internal Medicine | Admitting: Internal Medicine

## 2011-04-02 ENCOUNTER — Encounter (HOSPITAL_COMMUNITY)
Admission: RE | Admit: 2011-04-02 | Discharge: 2011-04-02 | Disposition: A | Discharge status: Home / Self Care | Payer: Self-pay | Source: Ambulatory Visit | Attending: Internal Medicine | Admitting: Internal Medicine

## 2011-04-02 ENCOUNTER — Ambulatory Visit (INDEPENDENT_AMBULATORY_CARE_PROVIDER_SITE_OTHER): Payer: Medicare Other | Admitting: *Deleted

## 2011-04-02 DIAGNOSIS — Z7901 Long term (current) use of anticoagulants: Secondary | ICD-10-CM

## 2011-04-02 DIAGNOSIS — I4891 Unspecified atrial fibrillation: Secondary | ICD-10-CM

## 2011-04-05 ENCOUNTER — Encounter (HOSPITAL_COMMUNITY)
Admission: RE | Admit: 2011-04-05 | Discharge: 2011-04-05 | Disposition: A | Discharge status: Home / Self Care | Payer: Self-pay | Source: Ambulatory Visit | Attending: Internal Medicine | Admitting: Internal Medicine

## 2011-04-06 ENCOUNTER — Encounter: Payer: Self-pay | Admitting: Internal Medicine

## 2011-04-06 ENCOUNTER — Ambulatory Visit (INDEPENDENT_AMBULATORY_CARE_PROVIDER_SITE_OTHER): Payer: Medicare Other | Admitting: Internal Medicine

## 2011-04-06 VITALS — BP 128/60 | HR 60 | Ht 69.0 in | Wt 171.8 lb

## 2011-04-06 DIAGNOSIS — I4891 Unspecified atrial fibrillation: Secondary | ICD-10-CM

## 2011-04-06 DIAGNOSIS — Z950 Presence of cardiac pacemaker: Secondary | ICD-10-CM

## 2011-04-06 LAB — PACEMAKER DEVICE OBSERVATION
BATTERY VOLTAGE: 2.993 V
BRDY-0002RV: 60 {beats}/min
DEVICE MODEL PM: 2574597
RV LEAD IMPEDENCE PM: 575 Ohm

## 2011-04-06 NOTE — Assessment & Plan Note (Signed)
The patient's ventricular rate is well controlled. He will continue his current medical therapy. 

## 2011-04-06 NOTE — Assessment & Plan Note (Signed)
Her device is working normally. We'll plan to recheck in several months. 

## 2011-04-06 NOTE — Patient Instructions (Signed)
Your physician wants you to follow-up in: 12 months with Dr Taylor  You will receive a reminder letter in the mail two months in advance. If you don't receive a letter, please call our office to schedule the follow-up appointment.   Remote monitoring is used to monitor your Pacemaker of ICD from home. This monitoring reduces the number of office visits required to check your device to one time per year. It allows us to keep an eye on the functioning of your device to ensure it is working properly. You are scheduled for a device check from home on 07/08/2011. You may send your transmission at any time that day. If you have a wireless device, the transmission will be sent automatically. After your physician reviews your transmission, you will receive a postcard with your next transmission date.   

## 2011-04-06 NOTE — Progress Notes (Signed)
HPI [name] returns today for followup. He is a very pleasant 78 year old man with a history of chronic atrial fibrillation and complete heart block. He also has diastolic heart failure and is status post pacemaker insertion. He continues to do well. He has mild peripheral edema but denies dyspnea with exertion, syncope, or chest pain. No palpitations. Allergies  Allergen Reactions  . Penicillins Swelling     Current Outpatient Prescriptions  Medication Sig Dispense Refill  . acetaminophen (TYLENOL) 500 MG chewable tablet Chew 500 mg by mouth every 6 (six) hours as needed. For pain      . atorvastatin (LIPITOR) 40 MG tablet Take 20 mg by mouth.       . B-D UF III MINI PEN NEEDLES 31G X 5 MM MISC       . Calcium Carbonate Antacid (MAALOX) 600 MG chewable tablet Chew 600 mg by mouth as needed.      . clindamycin (CLEOCIN) 150 MG capsule Take 150 mg by mouth 4 (four) times daily. Prior to dental work      . cloNIDine (CATAPRES) 0.2 MG tablet Take 0.4 mg by mouth 3 (three) times daily.       . cyanocobalamin 100 MCG tablet Take 100 mcg by mouth daily.      . digoxin (LANOXIN) 0.25 MG tablet Take 250 mcg by mouth daily. One half tablet daily      . Exenatide (BYETTA 5 MCG PEN [place]) Inject 5 mg into the skin 2 (two) times daily.       . ferrous sulfate 325 (65 FE) MG EC tablet Take 325 mg by mouth daily. 1/2 tablet daily 27 mg      . furosemide (LASIX) 40 MG tablet Take 40 mg by mouth as needed.      Marland Kitchen glipiZIDE (GLUCOTROL) 5 MG tablet Take 5 mg by mouth 2 (two) times daily before a meal.        . metoprolol (TOPROL-XL) 50 MG 24 hr tablet Take 50 mg by mouth daily.        . nitroGLYCERIN (NITROSTAT) 0.4 MG SL tablet Place 0.4 mg under the tongue every 5 (five) minutes as needed.        . ramipril (ALTACE) 5 MG capsule Take 5 mg by mouth daily.        Marland Kitchen triamterene-hydrochlorothiazide (MAXZIDE-25) 37.5-25 MG per tablet Take 1 tablet by mouth daily. Takes either a half a tablet a day or a whole  tablet a day      . warfarin (COUMADIN) 5 MG tablet Take 2.5-5 mg by mouth daily. Takes 5mg  daily except on tue and thur takes 2.5mg          Past Medical History  Diagnosis Date  . A-fib   . Hypertension   . CAD (coronary artery disease)   . Diabetes mellitus     ROS:   All systems reviewed and negative except as noted in the HPI.   Past Surgical History  Procedure Date  . Cholecystectomy   . Coronary artery bypass graft   . Pacemaker insertion   . Cardiac catheterization 2001, 2004  . Cataract surgery 2000     Family History  Problem Relation Age of Onset  . Diabetes Mother   . Coronary artery disease Father   . Diabetes Sister      History   Social History  . Marital Status: Widowed    Spouse Name: N/A    Number of Children: N/A  . Years of  Education: N/A   Occupational History  . Not on file.   Social History Main Topics  . Smoking status: Never Smoker   . Smokeless tobacco: Not on file  . Alcohol Use: No  . Drug Use: No  . Sexually Active: Not on file   Other Topics Concern  . Not on file   Social History Narrative  . No narrative on file     BP 128/60  Pulse 60  Ht 5\' 9"  (1.753 m)  Wt 77.928 kg (171 lb 12.8 oz)  BMI 25.37 kg/m2  Physical Exam:  Well appearing elderly man, NAD HEENT: Unremarkable Neck:  No JVD, no thyromegally Lungs:  Clear with no wheezes, rales, or rhonchi. HEART:  Regular rate rhythm, no murmurs, no rubs, no clicks. S2 is split. Abd:  soft, positive bowel sounds, no organomegally, no rebound, no guarding Ext:  2 plus pulses, 2+ edema in the lower extremities, no cyanosis, no clubbing Skin:  No rashes no nodules Neuro:  CN II through XII intact, motor grossly intact  DEVICE  Normal device function.  See PaceArt for details.   Assess/Plan:

## 2011-04-07 ENCOUNTER — Encounter (HOSPITAL_COMMUNITY)
Admission: RE | Admit: 2011-04-07 | Discharge: 2011-04-07 | Disposition: A | Discharge status: Home / Self Care | Payer: Self-pay | Source: Ambulatory Visit | Attending: Internal Medicine | Admitting: Internal Medicine

## 2011-04-09 ENCOUNTER — Encounter (HOSPITAL_COMMUNITY)
Admission: RE | Admit: 2011-04-09 | Discharge: 2011-04-09 | Disposition: A | Discharge status: Home / Self Care | Payer: Self-pay | Source: Ambulatory Visit | Attending: Internal Medicine | Admitting: Internal Medicine

## 2011-04-12 ENCOUNTER — Encounter (HOSPITAL_COMMUNITY)
Admission: RE | Admit: 2011-04-12 | Discharge: 2011-04-12 | Disposition: A | Discharge status: Home / Self Care | Payer: Self-pay | Source: Ambulatory Visit | Attending: Internal Medicine | Admitting: Internal Medicine

## 2011-04-14 ENCOUNTER — Encounter (HOSPITAL_COMMUNITY)
Admission: RE | Admit: 2011-04-14 | Discharge: 2011-04-14 | Disposition: A | Discharge status: Home / Self Care | Payer: Self-pay | Source: Ambulatory Visit | Attending: Internal Medicine | Admitting: Internal Medicine

## 2011-04-16 ENCOUNTER — Encounter (HOSPITAL_COMMUNITY)
Admission: RE | Admit: 2011-04-16 | Discharge: 2011-04-16 | Disposition: A | Discharge status: Home / Self Care | Payer: Self-pay | Source: Ambulatory Visit | Attending: Internal Medicine | Admitting: Internal Medicine

## 2011-04-19 ENCOUNTER — Encounter (HOSPITAL_COMMUNITY)
Admission: RE | Admit: 2011-04-19 | Discharge: 2011-04-19 | Disposition: A | Discharge status: Home / Self Care | Payer: Self-pay | Source: Ambulatory Visit | Attending: Internal Medicine | Admitting: Internal Medicine

## 2011-04-19 DIAGNOSIS — I10 Essential (primary) hypertension: Secondary | ICD-10-CM | POA: Insufficient documentation

## 2011-04-19 DIAGNOSIS — E119 Type 2 diabetes mellitus without complications: Secondary | ICD-10-CM | POA: Insufficient documentation

## 2011-04-19 DIAGNOSIS — I2510 Atherosclerotic heart disease of native coronary artery without angina pectoris: Secondary | ICD-10-CM | POA: Insufficient documentation

## 2011-04-19 DIAGNOSIS — Z951 Presence of aortocoronary bypass graft: Secondary | ICD-10-CM | POA: Insufficient documentation

## 2011-04-19 DIAGNOSIS — E7800 Pure hypercholesterolemia, unspecified: Secondary | ICD-10-CM | POA: Insufficient documentation

## 2011-04-19 DIAGNOSIS — Z5189 Encounter for other specified aftercare: Secondary | ICD-10-CM | POA: Insufficient documentation

## 2011-04-19 DIAGNOSIS — Z8249 Family history of ischemic heart disease and other diseases of the circulatory system: Secondary | ICD-10-CM | POA: Insufficient documentation

## 2011-04-21 ENCOUNTER — Encounter (HOSPITAL_COMMUNITY)
Admission: RE | Admit: 2011-04-21 | Discharge: 2011-04-21 | Disposition: A | Discharge status: Home / Self Care | Payer: Self-pay | Source: Ambulatory Visit | Attending: Internal Medicine | Admitting: Internal Medicine

## 2011-04-23 ENCOUNTER — Encounter (HOSPITAL_COMMUNITY)
Admission: RE | Admit: 2011-04-23 | Discharge: 2011-04-23 | Disposition: A | Discharge status: Home / Self Care | Payer: Self-pay | Source: Ambulatory Visit | Attending: Internal Medicine | Admitting: Internal Medicine

## 2011-04-26 ENCOUNTER — Encounter (HOSPITAL_COMMUNITY)
Admission: RE | Admit: 2011-04-26 | Discharge: 2011-04-26 | Disposition: A | Discharge status: Home / Self Care | Payer: Self-pay | Source: Ambulatory Visit | Attending: Internal Medicine | Admitting: Internal Medicine

## 2011-04-28 ENCOUNTER — Encounter (HOSPITAL_COMMUNITY)
Admission: RE | Admit: 2011-04-28 | Discharge: 2011-04-28 | Disposition: A | Discharge status: Home / Self Care | Payer: Self-pay | Source: Ambulatory Visit | Attending: Internal Medicine | Admitting: Internal Medicine

## 2011-04-30 ENCOUNTER — Encounter (HOSPITAL_COMMUNITY)
Admission: RE | Admit: 2011-04-30 | Discharge: 2011-04-30 | Disposition: A | Discharge status: Home / Self Care | Payer: Self-pay | Source: Ambulatory Visit | Attending: Internal Medicine | Admitting: Internal Medicine

## 2011-05-03 ENCOUNTER — Encounter (HOSPITAL_COMMUNITY)
Admission: RE | Admit: 2011-05-03 | Discharge: 2011-05-03 | Disposition: A | Discharge status: Home / Self Care | Payer: Self-pay | Source: Ambulatory Visit | Attending: Internal Medicine | Admitting: Internal Medicine

## 2011-05-05 ENCOUNTER — Encounter (HOSPITAL_COMMUNITY)
Admission: RE | Admit: 2011-05-05 | Discharge: 2011-05-05 | Disposition: A | Discharge status: Home / Self Care | Payer: Self-pay | Source: Ambulatory Visit | Attending: Internal Medicine | Admitting: Internal Medicine

## 2011-05-07 ENCOUNTER — Encounter (HOSPITAL_COMMUNITY): Payer: Self-pay

## 2011-05-10 ENCOUNTER — Encounter (HOSPITAL_COMMUNITY): Payer: Self-pay

## 2011-05-12 ENCOUNTER — Encounter (HOSPITAL_COMMUNITY)
Admission: RE | Admit: 2011-05-12 | Discharge: 2011-05-12 | Disposition: A | Discharge status: Home / Self Care | Payer: Self-pay | Source: Ambulatory Visit | Attending: Internal Medicine | Admitting: Internal Medicine

## 2011-05-14 ENCOUNTER — Encounter (HOSPITAL_COMMUNITY)
Admission: RE | Admit: 2011-05-14 | Discharge: 2011-05-14 | Disposition: A | Discharge status: Home / Self Care | Payer: Self-pay | Source: Ambulatory Visit | Attending: Internal Medicine | Admitting: Internal Medicine

## 2011-05-14 ENCOUNTER — Ambulatory Visit (INDEPENDENT_AMBULATORY_CARE_PROVIDER_SITE_OTHER): Payer: Medicare Other | Admitting: Pharmacist

## 2011-05-14 DIAGNOSIS — I4891 Unspecified atrial fibrillation: Secondary | ICD-10-CM

## 2011-05-14 DIAGNOSIS — Z7901 Long term (current) use of anticoagulants: Secondary | ICD-10-CM

## 2011-05-17 ENCOUNTER — Encounter (HOSPITAL_COMMUNITY)
Admission: RE | Admit: 2011-05-17 | Discharge: 2011-05-17 | Disposition: A | Discharge status: Home / Self Care | Payer: Self-pay | Source: Ambulatory Visit | Attending: Internal Medicine | Admitting: Internal Medicine

## 2011-05-19 ENCOUNTER — Encounter (HOSPITAL_COMMUNITY)
Admission: RE | Admit: 2011-05-19 | Discharge: 2011-05-19 | Disposition: A | Discharge status: Home / Self Care | Payer: Self-pay | Source: Ambulatory Visit | Attending: Internal Medicine | Admitting: Internal Medicine

## 2011-05-19 DIAGNOSIS — E7800 Pure hypercholesterolemia, unspecified: Secondary | ICD-10-CM | POA: Insufficient documentation

## 2011-05-19 DIAGNOSIS — Z8249 Family history of ischemic heart disease and other diseases of the circulatory system: Secondary | ICD-10-CM | POA: Insufficient documentation

## 2011-05-19 DIAGNOSIS — Z951 Presence of aortocoronary bypass graft: Secondary | ICD-10-CM | POA: Insufficient documentation

## 2011-05-19 DIAGNOSIS — I2510 Atherosclerotic heart disease of native coronary artery without angina pectoris: Secondary | ICD-10-CM | POA: Insufficient documentation

## 2011-05-19 DIAGNOSIS — Z5189 Encounter for other specified aftercare: Secondary | ICD-10-CM | POA: Insufficient documentation

## 2011-05-19 DIAGNOSIS — E119 Type 2 diabetes mellitus without complications: Secondary | ICD-10-CM | POA: Insufficient documentation

## 2011-05-19 DIAGNOSIS — I10 Essential (primary) hypertension: Secondary | ICD-10-CM | POA: Insufficient documentation

## 2011-05-21 ENCOUNTER — Encounter (HOSPITAL_COMMUNITY)
Admission: RE | Admit: 2011-05-21 | Discharge: 2011-05-21 | Disposition: A | Discharge status: Home / Self Care | Payer: Self-pay | Source: Ambulatory Visit | Attending: Internal Medicine | Admitting: Internal Medicine

## 2011-05-24 ENCOUNTER — Encounter (HOSPITAL_COMMUNITY)
Admission: RE | Admit: 2011-05-24 | Discharge: 2011-05-24 | Disposition: A | Discharge status: Home / Self Care | Payer: Self-pay | Source: Ambulatory Visit | Attending: Internal Medicine | Admitting: Internal Medicine

## 2011-05-26 ENCOUNTER — Encounter (HOSPITAL_COMMUNITY)
Admission: RE | Admit: 2011-05-26 | Discharge: 2011-05-26 | Disposition: A | Discharge status: Home / Self Care | Payer: Self-pay | Source: Ambulatory Visit | Attending: Internal Medicine | Admitting: Internal Medicine

## 2011-05-28 ENCOUNTER — Encounter (HOSPITAL_COMMUNITY)
Admission: RE | Admit: 2011-05-28 | Discharge: 2011-05-28 | Disposition: A | Discharge status: Home / Self Care | Payer: Self-pay | Source: Ambulatory Visit | Attending: Internal Medicine | Admitting: Internal Medicine

## 2011-05-31 ENCOUNTER — Encounter (HOSPITAL_COMMUNITY)
Admission: RE | Admit: 2011-05-31 | Discharge: 2011-05-31 | Disposition: A | Discharge status: Home / Self Care | Payer: Self-pay | Source: Ambulatory Visit | Attending: Internal Medicine | Admitting: Internal Medicine

## 2011-06-02 ENCOUNTER — Encounter (HOSPITAL_COMMUNITY)
Admission: RE | Admit: 2011-06-02 | Discharge: 2011-06-02 | Disposition: A | Discharge status: Home / Self Care | Payer: Self-pay | Source: Ambulatory Visit | Attending: Internal Medicine | Admitting: Internal Medicine

## 2011-06-04 ENCOUNTER — Encounter (HOSPITAL_COMMUNITY)
Admission: RE | Admit: 2011-06-04 | Discharge: 2011-06-04 | Disposition: A | Discharge status: Home / Self Care | Payer: Self-pay | Source: Ambulatory Visit | Attending: Internal Medicine | Admitting: Internal Medicine

## 2011-06-07 ENCOUNTER — Encounter (HOSPITAL_COMMUNITY)
Admission: RE | Admit: 2011-06-07 | Discharge: 2011-06-07 | Disposition: A | Discharge status: Home / Self Care | Payer: Self-pay | Source: Ambulatory Visit | Attending: Internal Medicine | Admitting: Internal Medicine

## 2011-06-09 ENCOUNTER — Encounter (HOSPITAL_COMMUNITY)
Admission: RE | Admit: 2011-06-09 | Discharge: 2011-06-09 | Disposition: A | Discharge status: Home / Self Care | Payer: Self-pay | Source: Ambulatory Visit | Attending: Internal Medicine | Admitting: Internal Medicine

## 2011-06-11 ENCOUNTER — Ambulatory Visit (INDEPENDENT_AMBULATORY_CARE_PROVIDER_SITE_OTHER): Payer: Medicare Other | Admitting: *Deleted

## 2011-06-11 ENCOUNTER — Encounter (HOSPITAL_COMMUNITY)
Admission: RE | Admit: 2011-06-11 | Discharge: 2011-06-11 | Disposition: A | Discharge status: Home / Self Care | Payer: Self-pay | Source: Ambulatory Visit | Attending: Internal Medicine | Admitting: Internal Medicine

## 2011-06-11 DIAGNOSIS — I4891 Unspecified atrial fibrillation: Secondary | ICD-10-CM

## 2011-06-11 DIAGNOSIS — Z7901 Long term (current) use of anticoagulants: Secondary | ICD-10-CM

## 2011-06-14 ENCOUNTER — Encounter (HOSPITAL_COMMUNITY): Payer: Self-pay

## 2011-06-16 ENCOUNTER — Encounter (HOSPITAL_COMMUNITY)
Admission: RE | Admit: 2011-06-16 | Discharge: 2011-06-16 | Disposition: A | Discharge status: Home / Self Care | Payer: Self-pay | Source: Ambulatory Visit | Attending: Internal Medicine | Admitting: Internal Medicine

## 2011-06-18 ENCOUNTER — Encounter (HOSPITAL_COMMUNITY)
Admission: RE | Admit: 2011-06-18 | Discharge: 2011-06-18 | Disposition: A | Discharge status: Home / Self Care | Payer: Self-pay | Source: Ambulatory Visit | Attending: Internal Medicine | Admitting: Internal Medicine

## 2011-06-21 ENCOUNTER — Encounter (HOSPITAL_COMMUNITY)
Admission: RE | Admit: 2011-06-21 | Discharge: 2011-06-21 | Disposition: A | Discharge status: Home / Self Care | Payer: Self-pay | Source: Ambulatory Visit | Attending: Internal Medicine | Admitting: Internal Medicine

## 2011-06-21 DIAGNOSIS — Z5189 Encounter for other specified aftercare: Secondary | ICD-10-CM | POA: Insufficient documentation

## 2011-06-21 DIAGNOSIS — Z8249 Family history of ischemic heart disease and other diseases of the circulatory system: Secondary | ICD-10-CM | POA: Insufficient documentation

## 2011-06-21 DIAGNOSIS — E119 Type 2 diabetes mellitus without complications: Secondary | ICD-10-CM | POA: Insufficient documentation

## 2011-06-21 DIAGNOSIS — E7800 Pure hypercholesterolemia, unspecified: Secondary | ICD-10-CM | POA: Insufficient documentation

## 2011-06-21 DIAGNOSIS — Z951 Presence of aortocoronary bypass graft: Secondary | ICD-10-CM | POA: Insufficient documentation

## 2011-06-21 DIAGNOSIS — I2510 Atherosclerotic heart disease of native coronary artery without angina pectoris: Secondary | ICD-10-CM | POA: Insufficient documentation

## 2011-06-21 DIAGNOSIS — I10 Essential (primary) hypertension: Secondary | ICD-10-CM | POA: Insufficient documentation

## 2011-06-23 ENCOUNTER — Encounter (HOSPITAL_COMMUNITY)
Admission: RE | Admit: 2011-06-23 | Discharge: 2011-06-23 | Disposition: A | Discharge status: Home / Self Care | Payer: Self-pay | Source: Ambulatory Visit | Attending: Internal Medicine | Admitting: Internal Medicine

## 2011-06-25 ENCOUNTER — Encounter (HOSPITAL_COMMUNITY)
Admission: RE | Admit: 2011-06-25 | Discharge: 2011-06-25 | Disposition: A | Discharge status: Home / Self Care | Payer: Self-pay | Source: Ambulatory Visit | Attending: Internal Medicine | Admitting: Internal Medicine

## 2011-06-28 ENCOUNTER — Encounter (HOSPITAL_COMMUNITY)
Admission: RE | Admit: 2011-06-28 | Discharge: 2011-06-28 | Disposition: A | Discharge status: Home / Self Care | Payer: Self-pay | Source: Ambulatory Visit | Attending: Internal Medicine | Admitting: Internal Medicine

## 2011-06-30 ENCOUNTER — Encounter (HOSPITAL_COMMUNITY)
Admission: RE | Admit: 2011-06-30 | Discharge: 2011-06-30 | Disposition: A | Discharge status: Home / Self Care | Payer: Self-pay | Source: Ambulatory Visit | Attending: Internal Medicine | Admitting: Internal Medicine

## 2011-07-02 ENCOUNTER — Encounter (HOSPITAL_COMMUNITY)
Admission: RE | Admit: 2011-07-02 | Discharge: 2011-07-02 | Disposition: A | Discharge status: Home / Self Care | Payer: Self-pay | Source: Ambulatory Visit | Attending: Internal Medicine | Admitting: Internal Medicine

## 2011-07-05 ENCOUNTER — Encounter (HOSPITAL_COMMUNITY)
Admission: RE | Admit: 2011-07-05 | Discharge: 2011-07-05 | Disposition: A | Discharge status: Home / Self Care | Payer: Self-pay | Source: Ambulatory Visit | Attending: Internal Medicine | Admitting: Internal Medicine

## 2011-07-07 ENCOUNTER — Encounter (HOSPITAL_COMMUNITY)
Admission: RE | Admit: 2011-07-07 | Discharge: 2011-07-07 | Disposition: A | Discharge status: Home / Self Care | Payer: Self-pay | Source: Ambulatory Visit | Attending: Internal Medicine | Admitting: Internal Medicine

## 2011-07-08 ENCOUNTER — Ambulatory Visit (INDEPENDENT_AMBULATORY_CARE_PROVIDER_SITE_OTHER): Payer: Medicare Other | Admitting: *Deleted

## 2011-07-08 ENCOUNTER — Encounter: Payer: Self-pay | Admitting: Internal Medicine

## 2011-07-08 DIAGNOSIS — I4891 Unspecified atrial fibrillation: Secondary | ICD-10-CM

## 2011-07-08 DIAGNOSIS — Z950 Presence of cardiac pacemaker: Secondary | ICD-10-CM

## 2011-07-09 ENCOUNTER — Ambulatory Visit (INDEPENDENT_AMBULATORY_CARE_PROVIDER_SITE_OTHER): Payer: Medicare Other | Admitting: *Deleted

## 2011-07-09 ENCOUNTER — Encounter (HOSPITAL_COMMUNITY)
Admission: RE | Admit: 2011-07-09 | Discharge: 2011-07-09 | Disposition: A | Discharge status: Home / Self Care | Payer: Self-pay | Source: Ambulatory Visit | Attending: Internal Medicine | Admitting: Internal Medicine

## 2011-07-09 DIAGNOSIS — Z7901 Long term (current) use of anticoagulants: Secondary | ICD-10-CM

## 2011-07-09 DIAGNOSIS — I4891 Unspecified atrial fibrillation: Secondary | ICD-10-CM

## 2011-07-09 LAB — POCT INR: INR: 3

## 2011-07-12 ENCOUNTER — Encounter (HOSPITAL_COMMUNITY): Payer: Self-pay

## 2011-07-12 LAB — REMOTE PACEMAKER DEVICE: BRDY-0002RV: 60 {beats}/min

## 2011-07-14 ENCOUNTER — Encounter (HOSPITAL_COMMUNITY): Payer: Self-pay

## 2011-07-16 ENCOUNTER — Encounter (HOSPITAL_COMMUNITY): Payer: Self-pay

## 2011-07-19 ENCOUNTER — Encounter (HOSPITAL_COMMUNITY): Payer: Self-pay

## 2011-07-19 ENCOUNTER — Encounter: Payer: Self-pay | Admitting: *Deleted

## 2011-07-19 DIAGNOSIS — I2510 Atherosclerotic heart disease of native coronary artery without angina pectoris: Secondary | ICD-10-CM | POA: Insufficient documentation

## 2011-07-19 DIAGNOSIS — I10 Essential (primary) hypertension: Secondary | ICD-10-CM | POA: Insufficient documentation

## 2011-07-19 DIAGNOSIS — E119 Type 2 diabetes mellitus without complications: Secondary | ICD-10-CM | POA: Insufficient documentation

## 2011-07-19 DIAGNOSIS — Z8249 Family history of ischemic heart disease and other diseases of the circulatory system: Secondary | ICD-10-CM | POA: Insufficient documentation

## 2011-07-19 DIAGNOSIS — E7800 Pure hypercholesterolemia, unspecified: Secondary | ICD-10-CM | POA: Insufficient documentation

## 2011-07-19 DIAGNOSIS — Z5189 Encounter for other specified aftercare: Secondary | ICD-10-CM | POA: Insufficient documentation

## 2011-07-19 DIAGNOSIS — Z951 Presence of aortocoronary bypass graft: Secondary | ICD-10-CM | POA: Insufficient documentation

## 2011-07-21 ENCOUNTER — Encounter (HOSPITAL_COMMUNITY): Payer: Self-pay

## 2011-07-23 ENCOUNTER — Encounter (HOSPITAL_COMMUNITY): Payer: Self-pay

## 2011-07-26 ENCOUNTER — Encounter (HOSPITAL_COMMUNITY): Payer: Self-pay

## 2011-07-28 ENCOUNTER — Encounter (HOSPITAL_COMMUNITY)
Admission: RE | Admit: 2011-07-28 | Discharge: 2011-07-28 | Disposition: A | Discharge status: Home / Self Care | Payer: Self-pay | Source: Ambulatory Visit | Attending: Internal Medicine | Admitting: Internal Medicine

## 2011-07-30 ENCOUNTER — Encounter (HOSPITAL_COMMUNITY)
Admission: RE | Admit: 2011-07-30 | Discharge: 2011-07-30 | Disposition: A | Discharge status: Home / Self Care | Payer: Self-pay | Source: Ambulatory Visit | Attending: Internal Medicine | Admitting: Internal Medicine

## 2011-08-02 ENCOUNTER — Encounter (HOSPITAL_COMMUNITY)
Admission: RE | Admit: 2011-08-02 | Discharge: 2011-08-02 | Disposition: A | Discharge status: Home / Self Care | Payer: Self-pay | Source: Ambulatory Visit | Attending: Internal Medicine | Admitting: Internal Medicine

## 2011-08-04 ENCOUNTER — Encounter (HOSPITAL_COMMUNITY)
Admission: RE | Admit: 2011-08-04 | Discharge: 2011-08-04 | Disposition: A | Discharge status: Home / Self Care | Payer: Self-pay | Source: Ambulatory Visit | Attending: Internal Medicine | Admitting: Internal Medicine

## 2011-08-06 ENCOUNTER — Encounter (HOSPITAL_COMMUNITY)
Admission: RE | Admit: 2011-08-06 | Discharge: 2011-08-06 | Disposition: A | Discharge status: Home / Self Care | Payer: Self-pay | Source: Ambulatory Visit | Attending: Internal Medicine | Admitting: Internal Medicine

## 2011-08-06 ENCOUNTER — Ambulatory Visit (INDEPENDENT_AMBULATORY_CARE_PROVIDER_SITE_OTHER): Payer: Medicare Other | Admitting: *Deleted

## 2011-08-06 DIAGNOSIS — I4891 Unspecified atrial fibrillation: Secondary | ICD-10-CM

## 2011-08-06 DIAGNOSIS — Z7901 Long term (current) use of anticoagulants: Secondary | ICD-10-CM

## 2011-08-09 ENCOUNTER — Encounter (HOSPITAL_COMMUNITY)
Admission: RE | Admit: 2011-08-09 | Discharge: 2011-08-09 | Disposition: A | Discharge status: Home / Self Care | Payer: Self-pay | Source: Ambulatory Visit | Attending: Internal Medicine | Admitting: Internal Medicine

## 2011-08-11 ENCOUNTER — Encounter (HOSPITAL_COMMUNITY)
Admission: RE | Admit: 2011-08-11 | Discharge: 2011-08-11 | Disposition: A | Discharge status: Home / Self Care | Payer: Self-pay | Source: Ambulatory Visit | Attending: Internal Medicine | Admitting: Internal Medicine

## 2011-08-13 ENCOUNTER — Encounter (HOSPITAL_COMMUNITY)
Admission: RE | Admit: 2011-08-13 | Discharge: 2011-08-13 | Disposition: A | Discharge status: Home / Self Care | Payer: Self-pay | Source: Ambulatory Visit | Attending: Internal Medicine | Admitting: Internal Medicine

## 2011-08-16 ENCOUNTER — Encounter (HOSPITAL_COMMUNITY)
Admission: RE | Admit: 2011-08-16 | Discharge: 2011-08-16 | Disposition: A | Discharge status: Home / Self Care | Payer: Self-pay | Source: Ambulatory Visit | Attending: Internal Medicine | Admitting: Internal Medicine

## 2011-08-18 ENCOUNTER — Encounter (HOSPITAL_COMMUNITY)
Admission: RE | Admit: 2011-08-18 | Discharge: 2011-08-18 | Disposition: A | Discharge status: Home / Self Care | Payer: Self-pay | Source: Ambulatory Visit | Attending: Internal Medicine | Admitting: Internal Medicine

## 2011-08-20 ENCOUNTER — Encounter (HOSPITAL_COMMUNITY): Payer: Self-pay

## 2011-08-20 DIAGNOSIS — Z8249 Family history of ischemic heart disease and other diseases of the circulatory system: Secondary | ICD-10-CM | POA: Insufficient documentation

## 2011-08-20 DIAGNOSIS — I2510 Atherosclerotic heart disease of native coronary artery without angina pectoris: Secondary | ICD-10-CM | POA: Insufficient documentation

## 2011-08-20 DIAGNOSIS — Z951 Presence of aortocoronary bypass graft: Secondary | ICD-10-CM | POA: Insufficient documentation

## 2011-08-20 DIAGNOSIS — I10 Essential (primary) hypertension: Secondary | ICD-10-CM | POA: Insufficient documentation

## 2011-08-20 DIAGNOSIS — Z5189 Encounter for other specified aftercare: Secondary | ICD-10-CM | POA: Insufficient documentation

## 2011-08-20 DIAGNOSIS — E119 Type 2 diabetes mellitus without complications: Secondary | ICD-10-CM | POA: Insufficient documentation

## 2011-08-20 DIAGNOSIS — E7800 Pure hypercholesterolemia, unspecified: Secondary | ICD-10-CM | POA: Insufficient documentation

## 2011-08-23 ENCOUNTER — Encounter (HOSPITAL_COMMUNITY): Payer: Self-pay

## 2011-08-25 ENCOUNTER — Encounter (HOSPITAL_COMMUNITY): Payer: Self-pay

## 2011-08-27 ENCOUNTER — Encounter (HOSPITAL_COMMUNITY): Payer: Self-pay

## 2011-08-30 ENCOUNTER — Encounter (HOSPITAL_COMMUNITY): Payer: Self-pay

## 2011-09-01 ENCOUNTER — Encounter (HOSPITAL_COMMUNITY): Payer: Self-pay

## 2011-09-03 ENCOUNTER — Encounter (HOSPITAL_COMMUNITY): Payer: Self-pay

## 2011-09-06 ENCOUNTER — Encounter (HOSPITAL_COMMUNITY): Payer: Self-pay

## 2011-09-08 ENCOUNTER — Encounter (HOSPITAL_COMMUNITY): Payer: Self-pay

## 2011-09-10 ENCOUNTER — Encounter (HOSPITAL_COMMUNITY): Payer: Self-pay

## 2011-09-13 ENCOUNTER — Encounter (HOSPITAL_COMMUNITY): Payer: Self-pay

## 2011-09-15 ENCOUNTER — Encounter (HOSPITAL_COMMUNITY)
Admission: RE | Admit: 2011-09-15 | Discharge: 2011-09-15 | Disposition: A | Discharge status: Home / Self Care | Payer: Self-pay | Source: Ambulatory Visit | Attending: Internal Medicine | Admitting: Internal Medicine

## 2011-09-17 ENCOUNTER — Ambulatory Visit (INDEPENDENT_AMBULATORY_CARE_PROVIDER_SITE_OTHER): Payer: Medicare Other | Admitting: Pharmacist

## 2011-09-17 ENCOUNTER — Encounter (HOSPITAL_COMMUNITY)
Admission: RE | Admit: 2011-09-17 | Discharge: 2011-09-17 | Disposition: A | Discharge status: Home / Self Care | Payer: Self-pay | Source: Ambulatory Visit | Attending: Internal Medicine | Admitting: Internal Medicine

## 2011-09-17 DIAGNOSIS — Z7901 Long term (current) use of anticoagulants: Secondary | ICD-10-CM

## 2011-09-17 DIAGNOSIS — I4891 Unspecified atrial fibrillation: Secondary | ICD-10-CM

## 2011-09-17 LAB — POCT INR: INR: 1.9

## 2011-09-20 ENCOUNTER — Encounter (HOSPITAL_COMMUNITY): Payer: Self-pay

## 2011-09-22 ENCOUNTER — Encounter (HOSPITAL_COMMUNITY)
Admission: RE | Admit: 2011-09-22 | Discharge: 2011-09-22 | Disposition: A | Discharge status: Home / Self Care | Payer: Self-pay | Source: Ambulatory Visit | Attending: Internal Medicine | Admitting: Internal Medicine

## 2011-09-22 DIAGNOSIS — I10 Essential (primary) hypertension: Secondary | ICD-10-CM | POA: Insufficient documentation

## 2011-09-22 DIAGNOSIS — I2510 Atherosclerotic heart disease of native coronary artery without angina pectoris: Secondary | ICD-10-CM | POA: Insufficient documentation

## 2011-09-22 DIAGNOSIS — E7800 Pure hypercholesterolemia, unspecified: Secondary | ICD-10-CM | POA: Insufficient documentation

## 2011-09-22 DIAGNOSIS — Z5189 Encounter for other specified aftercare: Secondary | ICD-10-CM | POA: Insufficient documentation

## 2011-09-22 DIAGNOSIS — Z8249 Family history of ischemic heart disease and other diseases of the circulatory system: Secondary | ICD-10-CM | POA: Insufficient documentation

## 2011-09-22 DIAGNOSIS — Z951 Presence of aortocoronary bypass graft: Secondary | ICD-10-CM | POA: Insufficient documentation

## 2011-09-22 DIAGNOSIS — E119 Type 2 diabetes mellitus without complications: Secondary | ICD-10-CM | POA: Insufficient documentation

## 2011-09-24 ENCOUNTER — Encounter (HOSPITAL_COMMUNITY)
Admission: RE | Admit: 2011-09-24 | Discharge: 2011-09-24 | Disposition: A | Discharge status: Home / Self Care | Payer: Self-pay | Source: Ambulatory Visit | Attending: Internal Medicine | Admitting: Internal Medicine

## 2011-09-27 ENCOUNTER — Encounter (HOSPITAL_COMMUNITY)
Admission: RE | Admit: 2011-09-27 | Discharge: 2011-09-27 | Disposition: A | Discharge status: Home / Self Care | Payer: Self-pay | Source: Ambulatory Visit | Attending: Internal Medicine | Admitting: Internal Medicine

## 2011-09-29 ENCOUNTER — Encounter (HOSPITAL_COMMUNITY)
Admission: RE | Admit: 2011-09-29 | Discharge: 2011-09-29 | Disposition: A | Discharge status: Home / Self Care | Payer: Self-pay | Source: Ambulatory Visit | Attending: Internal Medicine | Admitting: Internal Medicine

## 2011-10-01 ENCOUNTER — Encounter (HOSPITAL_COMMUNITY)
Admission: RE | Admit: 2011-10-01 | Discharge: 2011-10-01 | Disposition: A | Discharge status: Home / Self Care | Payer: Self-pay | Source: Ambulatory Visit | Attending: Internal Medicine | Admitting: Internal Medicine

## 2011-10-04 ENCOUNTER — Encounter (HOSPITAL_COMMUNITY)
Admission: RE | Admit: 2011-10-04 | Discharge: 2011-10-04 | Disposition: A | Discharge status: Home / Self Care | Payer: Self-pay | Source: Ambulatory Visit | Attending: Internal Medicine | Admitting: Internal Medicine

## 2011-10-06 ENCOUNTER — Encounter (HOSPITAL_COMMUNITY)
Admission: RE | Admit: 2011-10-06 | Discharge: 2011-10-06 | Disposition: A | Discharge status: Home / Self Care | Payer: Self-pay | Source: Ambulatory Visit | Attending: Internal Medicine | Admitting: Internal Medicine

## 2011-10-08 ENCOUNTER — Encounter (HOSPITAL_COMMUNITY)
Admission: RE | Admit: 2011-10-08 | Discharge: 2011-10-08 | Disposition: A | Discharge status: Home / Self Care | Payer: Self-pay | Source: Ambulatory Visit | Attending: Internal Medicine | Admitting: Internal Medicine

## 2011-10-11 ENCOUNTER — Ambulatory Visit (INDEPENDENT_AMBULATORY_CARE_PROVIDER_SITE_OTHER): Payer: Medicare Other | Admitting: *Deleted

## 2011-10-11 ENCOUNTER — Encounter: Payer: Self-pay | Admitting: Internal Medicine

## 2011-10-11 ENCOUNTER — Encounter: Payer: Self-pay | Admitting: *Deleted

## 2011-10-11 ENCOUNTER — Encounter (HOSPITAL_COMMUNITY)
Admission: RE | Admit: 2011-10-11 | Discharge: 2011-10-11 | Disposition: A | Discharge status: Home / Self Care | Payer: Self-pay | Source: Ambulatory Visit | Attending: Internal Medicine | Admitting: Internal Medicine

## 2011-10-11 DIAGNOSIS — I4891 Unspecified atrial fibrillation: Secondary | ICD-10-CM

## 2011-10-11 DIAGNOSIS — I495 Sick sinus syndrome: Secondary | ICD-10-CM

## 2011-10-11 LAB — REMOTE PACEMAKER DEVICE
BRDY-0002RV: 60 {beats}/min
BRDY-0004RV: 120 {beats}/min
DEVICE MODEL PM: 2574597
RV LEAD AMPLITUDE: 10.3 mv
RV LEAD IMPEDENCE PM: 540 Ohm
RV LEAD THRESHOLD: 0.625 V

## 2011-10-13 ENCOUNTER — Encounter (HOSPITAL_COMMUNITY)
Admission: RE | Admit: 2011-10-13 | Discharge: 2011-10-13 | Disposition: A | Discharge status: Home / Self Care | Payer: Self-pay | Source: Ambulatory Visit | Attending: Internal Medicine | Admitting: Internal Medicine

## 2011-10-13 ENCOUNTER — Encounter: Payer: Self-pay | Admitting: *Deleted

## 2011-10-15 ENCOUNTER — Ambulatory Visit (INDEPENDENT_AMBULATORY_CARE_PROVIDER_SITE_OTHER): Payer: Medicare Other

## 2011-10-15 ENCOUNTER — Encounter (HOSPITAL_COMMUNITY)
Admission: RE | Admit: 2011-10-15 | Discharge: 2011-10-15 | Disposition: A | Discharge status: Home / Self Care | Payer: Self-pay | Source: Ambulatory Visit | Attending: Internal Medicine | Admitting: Internal Medicine

## 2011-10-15 DIAGNOSIS — Z7901 Long term (current) use of anticoagulants: Secondary | ICD-10-CM

## 2011-10-15 DIAGNOSIS — I4891 Unspecified atrial fibrillation: Secondary | ICD-10-CM

## 2011-10-15 LAB — POCT INR: INR: 2.3

## 2011-10-18 ENCOUNTER — Emergency Department (INDEPENDENT_AMBULATORY_CARE_PROVIDER_SITE_OTHER)
Admission: EM | Admit: 2011-10-18 | Discharge: 2011-10-18 | Disposition: A | Discharge status: Home / Self Care | Payer: Medicare Other | Source: Home / Self Care | Attending: Emergency Medicine | Admitting: Emergency Medicine

## 2011-10-18 ENCOUNTER — Encounter (HOSPITAL_COMMUNITY): Payer: Self-pay

## 2011-10-18 ENCOUNTER — Encounter (HOSPITAL_COMMUNITY)
Admission: RE | Admit: 2011-10-18 | Discharge: 2011-10-18 | Disposition: A | Discharge status: Home / Self Care | Payer: Self-pay | Source: Ambulatory Visit | Attending: Internal Medicine | Admitting: Internal Medicine

## 2011-10-18 DIAGNOSIS — F22 Delusional disorders: Secondary | ICD-10-CM

## 2011-10-18 DIAGNOSIS — F40298 Other specified phobia: Secondary | ICD-10-CM

## 2011-10-18 MED ORDER — CEPHALEXIN 500 MG PO CAPS: 500.0000 mg | ORAL_CAPSULE | Freq: Three times a day (TID) | ORAL | Status: DC

## 2011-10-18 MED ORDER — TRIAMCINOLONE ACETONIDE 0.1 % EX CREA: TOPICAL_CREAM | Freq: Three times a day (TID) | CUTANEOUS | Status: DC

## 2011-10-18 MED ORDER — MUPIROCIN 2 % EX OINT: TOPICAL_OINTMENT | Freq: Three times a day (TID) | CUTANEOUS | Status: DC

## 2011-10-18 NOTE — ED Provider Notes (Signed)
Chief Complaint  Patient presents with  . Rash    History of Present Illness:   Oscar Bruce is an 78 year old male who has a two-week history of a skin rash. He states that this came on after he had planted some flowers seeds in his garden which came from [place]. It is his opinion that something from the seeds got into his skin and now he has "worms and bugs" underneath his skin. He can feel them crawling underneath his skin, then he has to scratch at them to get them out and is able to extract a small bug the size of a tick or a small worm. He denies any pain but the areas are itchy. He denies any systemic symptoms such as fever or chills. He's not had any trouble breathing and he denies any swelling of the lips, tongue, or throat.  Review of Systems:  Other than noted above, the patient denies any of the following symptoms: Systemic:  No fever, chills, sweats, weight loss, or fatigue. ENT:  No nasal congestion, rhinorrhea, sore throat, swelling of lips, tongue or throat. Resp:  No cough, wheezing, or shortness of breath. Skin:  No rash, itching, nodules, or suspicious lesions.  PMFSH:  Past medical history, family history, social history, meds, and allergies were reviewed.  Physical Exam:   Vital signs:  BP 168/78  Pulse 61  Temp 97.8 F (36.6 C) (Oral)  Resp 18  SpO2 99% Gen:  Alert, oriented, in no distress. ENT:  Pharynx clear, no intraoral lesions, moist mucous membranes. Lungs:  Clear to auscultation. Skin:  He has multiple excoriated areas on his trunk and arms, mostly on the right side but also a few in the left side as well. Some of these are infected looking with surrounding erythema.  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  He has multiple excoriated areas. Some of these appear infected.  Plan:   1.  The following meds were prescribed:   New Prescriptions   CEPHALEXIN (KEFLEX) 500 MG CAPSULE    Take 1 capsule (500 mg total) by mouth 3 (three)  times daily.   MUPIROCIN OINTMENT (BACTROBAN) 2 %    Apply topically 3 (three) times daily.   TRIAMCINOLONE CREAM (KENALOG) 0.1 %    Apply topically 3 (three) times daily.   2.  The patient was instructed in symptomatic care and handouts were given. 3.  The patient was told to return if becoming worse in any way, if no better in 3 or 4 days, and given some red flag symptoms that would indicate earlier return.  Follow up:  The patient was told to follow up with a dermatologist, unfortunately is going to be going out of town for several months to [place] and will be unable to followup with anyone here. I suggested that he followup with someone in [place] and then possibly see someone when he gets back here.      Reuben Likes, MD 10/18/11 [phone]

## 2011-10-18 NOTE — ED Notes (Signed)
Patient states that he has "worms" on his skin, his right side is very irritated, states that this started about 30 days ago , states he is in no pain that it only itches

## 2011-10-19 NOTE — Progress Notes (Signed)
Remote pacer check  

## 2011-10-20 ENCOUNTER — Encounter (HOSPITAL_COMMUNITY)
Admission: RE | Admit: 2011-10-20 | Discharge: 2011-10-20 | Disposition: A | Discharge status: Home / Self Care | Payer: Self-pay | Source: Ambulatory Visit | Attending: Internal Medicine | Admitting: Internal Medicine

## 2011-10-20 DIAGNOSIS — E7800 Pure hypercholesterolemia, unspecified: Secondary | ICD-10-CM | POA: Insufficient documentation

## 2011-10-20 DIAGNOSIS — Z8249 Family history of ischemic heart disease and other diseases of the circulatory system: Secondary | ICD-10-CM | POA: Insufficient documentation

## 2011-10-20 DIAGNOSIS — I2510 Atherosclerotic heart disease of native coronary artery without angina pectoris: Secondary | ICD-10-CM | POA: Insufficient documentation

## 2011-10-20 DIAGNOSIS — E119 Type 2 diabetes mellitus without complications: Secondary | ICD-10-CM | POA: Insufficient documentation

## 2011-10-20 DIAGNOSIS — I10 Essential (primary) hypertension: Secondary | ICD-10-CM | POA: Insufficient documentation

## 2011-10-20 DIAGNOSIS — Z5189 Encounter for other specified aftercare: Secondary | ICD-10-CM | POA: Insufficient documentation

## 2011-10-20 DIAGNOSIS — Z951 Presence of aortocoronary bypass graft: Secondary | ICD-10-CM | POA: Insufficient documentation

## 2011-10-22 ENCOUNTER — Encounter (HOSPITAL_COMMUNITY): Admission: RE | Admit: 2011-10-22 | Payer: Self-pay | Source: Ambulatory Visit

## 2011-10-25 ENCOUNTER — Encounter (HOSPITAL_COMMUNITY): Payer: Self-pay

## 2011-10-27 ENCOUNTER — Encounter (HOSPITAL_COMMUNITY): Payer: Self-pay

## 2011-10-29 ENCOUNTER — Encounter (HOSPITAL_COMMUNITY): Payer: Self-pay

## 2011-11-01 ENCOUNTER — Encounter (HOSPITAL_COMMUNITY): Payer: Self-pay

## 2011-11-03 ENCOUNTER — Encounter (HOSPITAL_COMMUNITY): Payer: Self-pay

## 2011-11-05 ENCOUNTER — Encounter (HOSPITAL_COMMUNITY): Payer: Self-pay

## 2011-11-08 ENCOUNTER — Encounter (HOSPITAL_COMMUNITY): Payer: Self-pay

## 2011-11-10 ENCOUNTER — Encounter (HOSPITAL_COMMUNITY): Payer: Self-pay

## 2011-11-12 ENCOUNTER — Encounter (HOSPITAL_COMMUNITY): Payer: Self-pay

## 2011-11-15 ENCOUNTER — Encounter (HOSPITAL_COMMUNITY): Payer: Self-pay

## 2011-11-17 ENCOUNTER — Encounter (HOSPITAL_COMMUNITY): Payer: Self-pay

## 2011-11-24 ENCOUNTER — Encounter (HOSPITAL_COMMUNITY): Payer: Self-pay

## 2011-11-26 ENCOUNTER — Encounter (HOSPITAL_COMMUNITY): Payer: Self-pay

## 2011-11-29 ENCOUNTER — Encounter (HOSPITAL_COMMUNITY): Payer: Self-pay

## 2011-11-30 ENCOUNTER — Ambulatory Visit (INDEPENDENT_AMBULATORY_CARE_PROVIDER_SITE_OTHER): Payer: Medicare Other | Admitting: *Deleted

## 2011-11-30 DIAGNOSIS — I4891 Unspecified atrial fibrillation: Secondary | ICD-10-CM

## 2011-11-30 DIAGNOSIS — Z7901 Long term (current) use of anticoagulants: Secondary | ICD-10-CM

## 2011-11-30 LAB — POCT INR: INR: 2.2

## 2011-12-01 ENCOUNTER — Encounter (HOSPITAL_COMMUNITY)
Admission: RE | Admit: 2011-12-01 | Discharge: 2011-12-01 | Disposition: A | Discharge status: Home / Self Care | Payer: Self-pay | Source: Ambulatory Visit | Attending: Internal Medicine | Admitting: Internal Medicine

## 2011-12-03 ENCOUNTER — Encounter (HOSPITAL_COMMUNITY)
Admission: RE | Admit: 2011-12-03 | Discharge: 2011-12-03 | Disposition: A | Discharge status: Home / Self Care | Payer: Self-pay | Source: Ambulatory Visit | Attending: Internal Medicine | Admitting: Internal Medicine

## 2011-12-06 ENCOUNTER — Encounter (HOSPITAL_COMMUNITY)
Admission: RE | Admit: 2011-12-06 | Discharge: 2011-12-06 | Disposition: A | Discharge status: Home / Self Care | Payer: Self-pay | Source: Ambulatory Visit | Attending: Internal Medicine | Admitting: Internal Medicine

## 2011-12-08 ENCOUNTER — Encounter (HOSPITAL_COMMUNITY)
Admission: RE | Admit: 2011-12-08 | Discharge: 2011-12-08 | Disposition: A | Discharge status: Home / Self Care | Payer: Self-pay | Source: Ambulatory Visit | Attending: Internal Medicine | Admitting: Internal Medicine

## 2011-12-10 ENCOUNTER — Encounter (HOSPITAL_COMMUNITY)
Admission: RE | Admit: 2011-12-10 | Discharge: 2011-12-10 | Disposition: A | Discharge status: Home / Self Care | Payer: Self-pay | Source: Ambulatory Visit | Attending: Internal Medicine | Admitting: Internal Medicine

## 2011-12-13 ENCOUNTER — Encounter (HOSPITAL_COMMUNITY): Payer: Self-pay

## 2011-12-15 ENCOUNTER — Encounter (HOSPITAL_COMMUNITY): Payer: Self-pay

## 2011-12-20 ENCOUNTER — Encounter (HOSPITAL_COMMUNITY)
Admission: RE | Admit: 2011-12-20 | Discharge: 2011-12-20 | Disposition: A | Discharge status: Home / Self Care | Payer: Self-pay | Source: Ambulatory Visit | Attending: Internal Medicine | Admitting: Internal Medicine

## 2011-12-20 DIAGNOSIS — Z8249 Family history of ischemic heart disease and other diseases of the circulatory system: Secondary | ICD-10-CM | POA: Insufficient documentation

## 2011-12-20 DIAGNOSIS — Z5189 Encounter for other specified aftercare: Secondary | ICD-10-CM | POA: Insufficient documentation

## 2011-12-20 DIAGNOSIS — E119 Type 2 diabetes mellitus without complications: Secondary | ICD-10-CM | POA: Insufficient documentation

## 2011-12-20 DIAGNOSIS — E7800 Pure hypercholesterolemia, unspecified: Secondary | ICD-10-CM | POA: Insufficient documentation

## 2011-12-20 DIAGNOSIS — I10 Essential (primary) hypertension: Secondary | ICD-10-CM | POA: Insufficient documentation

## 2011-12-20 DIAGNOSIS — Z951 Presence of aortocoronary bypass graft: Secondary | ICD-10-CM | POA: Insufficient documentation

## 2011-12-20 DIAGNOSIS — I2510 Atherosclerotic heart disease of native coronary artery without angina pectoris: Secondary | ICD-10-CM | POA: Insufficient documentation

## 2011-12-22 ENCOUNTER — Encounter (HOSPITAL_COMMUNITY)
Admission: RE | Admit: 2011-12-22 | Discharge: 2011-12-22 | Disposition: A | Discharge status: Home / Self Care | Payer: Self-pay | Source: Ambulatory Visit | Attending: Internal Medicine | Admitting: Internal Medicine

## 2011-12-23 ENCOUNTER — Other Ambulatory Visit: Payer: Self-pay | Admitting: Internal Medicine

## 2011-12-23 DIAGNOSIS — R748 Abnormal levels of other serum enzymes: Secondary | ICD-10-CM

## 2011-12-24 ENCOUNTER — Encounter (HOSPITAL_COMMUNITY)
Admission: RE | Admit: 2011-12-24 | Discharge: 2011-12-24 | Disposition: A | Discharge status: Home / Self Care | Payer: Self-pay | Source: Ambulatory Visit | Attending: Internal Medicine | Admitting: Internal Medicine

## 2011-12-27 ENCOUNTER — Encounter (HOSPITAL_COMMUNITY)
Admission: RE | Admit: 2011-12-27 | Discharge: 2011-12-27 | Disposition: A | Discharge status: Home / Self Care | Payer: Self-pay | Source: Ambulatory Visit | Attending: Internal Medicine | Admitting: Internal Medicine

## 2011-12-28 ENCOUNTER — Ambulatory Visit (INDEPENDENT_AMBULATORY_CARE_PROVIDER_SITE_OTHER): Payer: Medicare Other | Admitting: *Deleted

## 2011-12-28 DIAGNOSIS — I4891 Unspecified atrial fibrillation: Secondary | ICD-10-CM

## 2011-12-28 DIAGNOSIS — Z7901 Long term (current) use of anticoagulants: Secondary | ICD-10-CM

## 2011-12-28 LAB — POCT INR: INR: 3.6

## 2011-12-29 ENCOUNTER — Ambulatory Visit
Admission: RE | Admit: 2011-12-29 | Discharge: 2011-12-29 | Disposition: A | Discharge status: Home / Self Care | Payer: Medicare Other | Source: Ambulatory Visit | Attending: Internal Medicine | Admitting: Internal Medicine

## 2011-12-29 ENCOUNTER — Encounter (HOSPITAL_COMMUNITY): Payer: Self-pay

## 2011-12-29 DIAGNOSIS — R748 Abnormal levels of other serum enzymes: Secondary | ICD-10-CM

## 2011-12-31 ENCOUNTER — Encounter (HOSPITAL_COMMUNITY)
Admission: RE | Admit: 2011-12-31 | Discharge: 2011-12-31 | Disposition: A | Discharge status: Home / Self Care | Payer: Self-pay | Source: Ambulatory Visit | Attending: Internal Medicine | Admitting: Internal Medicine

## 2012-01-03 ENCOUNTER — Encounter (HOSPITAL_COMMUNITY)
Admission: RE | Admit: 2012-01-03 | Discharge: 2012-01-03 | Disposition: A | Discharge status: Home / Self Care | Payer: Self-pay | Source: Ambulatory Visit | Attending: Internal Medicine | Admitting: Internal Medicine

## 2012-01-05 ENCOUNTER — Encounter (HOSPITAL_COMMUNITY)
Admission: RE | Admit: 2012-01-05 | Discharge: 2012-01-05 | Disposition: A | Discharge status: Home / Self Care | Payer: Self-pay | Source: Ambulatory Visit | Attending: Internal Medicine | Admitting: Internal Medicine

## 2012-01-06 ENCOUNTER — Ambulatory Visit (INDEPENDENT_AMBULATORY_CARE_PROVIDER_SITE_OTHER): Payer: Medicare Other | Admitting: *Deleted

## 2012-01-06 DIAGNOSIS — I4891 Unspecified atrial fibrillation: Secondary | ICD-10-CM

## 2012-01-06 DIAGNOSIS — Z7901 Long term (current) use of anticoagulants: Secondary | ICD-10-CM

## 2012-01-06 LAB — POCT INR: INR: 2.6

## 2012-01-07 ENCOUNTER — Encounter (HOSPITAL_COMMUNITY): Payer: Self-pay

## 2012-01-10 ENCOUNTER — Encounter (HOSPITAL_COMMUNITY): Payer: Self-pay

## 2012-01-14 ENCOUNTER — Encounter (HOSPITAL_COMMUNITY): Payer: Self-pay

## 2012-01-17 ENCOUNTER — Encounter (HOSPITAL_COMMUNITY): Payer: Self-pay

## 2012-01-17 ENCOUNTER — Encounter: Payer: Medicare Other | Admitting: *Deleted

## 2012-01-20 ENCOUNTER — Encounter: Payer: Self-pay | Admitting: *Deleted

## 2012-01-21 ENCOUNTER — Encounter (HOSPITAL_COMMUNITY): Payer: Self-pay

## 2012-01-21 DIAGNOSIS — I2510 Atherosclerotic heart disease of native coronary artery without angina pectoris: Secondary | ICD-10-CM | POA: Insufficient documentation

## 2012-01-21 DIAGNOSIS — E7800 Pure hypercholesterolemia, unspecified: Secondary | ICD-10-CM | POA: Insufficient documentation

## 2012-01-21 DIAGNOSIS — Z8249 Family history of ischemic heart disease and other diseases of the circulatory system: Secondary | ICD-10-CM | POA: Insufficient documentation

## 2012-01-21 DIAGNOSIS — I10 Essential (primary) hypertension: Secondary | ICD-10-CM | POA: Insufficient documentation

## 2012-01-21 DIAGNOSIS — Z5189 Encounter for other specified aftercare: Secondary | ICD-10-CM | POA: Insufficient documentation

## 2012-01-21 DIAGNOSIS — E119 Type 2 diabetes mellitus without complications: Secondary | ICD-10-CM | POA: Insufficient documentation

## 2012-01-21 DIAGNOSIS — Z951 Presence of aortocoronary bypass graft: Secondary | ICD-10-CM | POA: Insufficient documentation

## 2012-01-24 ENCOUNTER — Encounter (HOSPITAL_COMMUNITY)
Admission: RE | Admit: 2012-01-24 | Discharge: 2012-01-24 | Disposition: A | Discharge status: Home / Self Care | Payer: Self-pay | Source: Ambulatory Visit | Attending: Internal Medicine | Admitting: Internal Medicine

## 2012-01-26 ENCOUNTER — Encounter (HOSPITAL_COMMUNITY)
Admission: RE | Admit: 2012-01-26 | Discharge: 2012-01-26 | Disposition: A | Discharge status: Home / Self Care | Payer: Self-pay | Source: Ambulatory Visit | Attending: Internal Medicine | Admitting: Internal Medicine

## 2012-01-27 ENCOUNTER — Ambulatory Visit (INDEPENDENT_AMBULATORY_CARE_PROVIDER_SITE_OTHER): Payer: Medicare Other | Admitting: *Deleted

## 2012-01-27 DIAGNOSIS — Z7901 Long term (current) use of anticoagulants: Secondary | ICD-10-CM

## 2012-01-27 DIAGNOSIS — I4891 Unspecified atrial fibrillation: Secondary | ICD-10-CM

## 2012-01-27 LAB — POCT INR: INR: 2.2

## 2012-01-28 ENCOUNTER — Encounter (HOSPITAL_COMMUNITY)
Admission: RE | Admit: 2012-01-28 | Discharge: 2012-01-28 | Disposition: A | Discharge status: Home / Self Care | Payer: Self-pay | Source: Ambulatory Visit | Attending: Internal Medicine | Admitting: Internal Medicine

## 2012-01-31 ENCOUNTER — Encounter (HOSPITAL_COMMUNITY)
Admission: RE | Admit: 2012-01-31 | Discharge: 2012-01-31 | Disposition: A | Discharge status: Home / Self Care | Payer: Self-pay | Source: Ambulatory Visit | Attending: Internal Medicine | Admitting: Internal Medicine

## 2012-02-01 ENCOUNTER — Ambulatory Visit (INDEPENDENT_AMBULATORY_CARE_PROVIDER_SITE_OTHER): Payer: Medicare Other | Admitting: *Deleted

## 2012-02-01 ENCOUNTER — Telehealth: Payer: Self-pay | Admitting: Internal Medicine

## 2012-02-01 ENCOUNTER — Encounter: Payer: Self-pay | Admitting: Internal Medicine

## 2012-02-01 DIAGNOSIS — I4891 Unspecified atrial fibrillation: Secondary | ICD-10-CM

## 2012-02-01 DIAGNOSIS — Z950 Presence of cardiac pacemaker: Secondary | ICD-10-CM

## 2012-02-01 NOTE — Telephone Encounter (Signed)
Pt received letter that remote was not received and to call if he did send it, which he did

## 2012-02-01 NOTE — Telephone Encounter (Signed)
Spoke with patient.  We will see him in office in March and go over [employer] transmissions at that time.

## 2012-02-02 ENCOUNTER — Encounter (HOSPITAL_COMMUNITY)
Admission: RE | Admit: 2012-02-02 | Discharge: 2012-02-02 | Disposition: A | Discharge status: Home / Self Care | Payer: Self-pay | Source: Ambulatory Visit | Attending: Internal Medicine | Admitting: Internal Medicine

## 2012-02-02 LAB — REMOTE PACEMAKER DEVICE
BATTERY VOLTAGE: 2.98 V
BRDY-0002RV: 60 {beats}/min
RV LEAD AMPLITUDE: 12 mv
RV LEAD IMPEDENCE PM: 530 Ohm
RV LEAD THRESHOLD: 0.625 V

## 2012-02-04 ENCOUNTER — Encounter (HOSPITAL_COMMUNITY)
Admission: RE | Admit: 2012-02-04 | Discharge: 2012-02-04 | Disposition: A | Discharge status: Home / Self Care | Payer: Self-pay | Source: Ambulatory Visit | Attending: Internal Medicine | Admitting: Internal Medicine

## 2012-02-07 ENCOUNTER — Encounter (HOSPITAL_COMMUNITY)
Admission: RE | Admit: 2012-02-07 | Discharge: 2012-02-07 | Disposition: A | Discharge status: Home / Self Care | Payer: Self-pay | Source: Ambulatory Visit | Attending: Internal Medicine | Admitting: Internal Medicine

## 2012-02-09 ENCOUNTER — Encounter (HOSPITAL_COMMUNITY)
Admission: RE | Admit: 2012-02-09 | Discharge: 2012-02-09 | Disposition: A | Discharge status: Home / Self Care | Payer: Self-pay | Source: Ambulatory Visit | Attending: Internal Medicine | Admitting: Internal Medicine

## 2012-02-11 ENCOUNTER — Encounter (HOSPITAL_COMMUNITY)
Admission: RE | Admit: 2012-02-11 | Discharge: 2012-02-11 | Disposition: A | Discharge status: Home / Self Care | Payer: Self-pay | Source: Ambulatory Visit | Attending: Internal Medicine | Admitting: Internal Medicine

## 2012-02-14 ENCOUNTER — Encounter (HOSPITAL_COMMUNITY)
Admission: RE | Admit: 2012-02-14 | Discharge: 2012-02-14 | Disposition: A | Discharge status: Home / Self Care | Payer: Self-pay | Source: Ambulatory Visit | Attending: Internal Medicine | Admitting: Internal Medicine

## 2012-02-16 ENCOUNTER — Encounter (HOSPITAL_COMMUNITY): Payer: Self-pay

## 2012-02-17 ENCOUNTER — Encounter: Payer: Self-pay | Admitting: *Deleted

## 2012-02-18 ENCOUNTER — Encounter (HOSPITAL_COMMUNITY)
Admission: RE | Admit: 2012-02-18 | Discharge: 2012-02-18 | Disposition: A | Discharge status: Home / Self Care | Payer: Self-pay | Source: Ambulatory Visit | Attending: Internal Medicine | Admitting: Internal Medicine

## 2012-02-21 ENCOUNTER — Encounter (HOSPITAL_COMMUNITY)
Admission: RE | Admit: 2012-02-21 | Discharge: 2012-02-21 | Disposition: A | Discharge status: Home / Self Care | Payer: Self-pay | Source: Ambulatory Visit | Attending: Internal Medicine | Admitting: Internal Medicine

## 2012-02-21 DIAGNOSIS — I2510 Atherosclerotic heart disease of native coronary artery without angina pectoris: Secondary | ICD-10-CM | POA: Insufficient documentation

## 2012-02-21 DIAGNOSIS — I10 Essential (primary) hypertension: Secondary | ICD-10-CM | POA: Insufficient documentation

## 2012-02-21 DIAGNOSIS — Z5189 Encounter for other specified aftercare: Secondary | ICD-10-CM | POA: Insufficient documentation

## 2012-02-21 DIAGNOSIS — E119 Type 2 diabetes mellitus without complications: Secondary | ICD-10-CM | POA: Insufficient documentation

## 2012-02-21 DIAGNOSIS — Z951 Presence of aortocoronary bypass graft: Secondary | ICD-10-CM | POA: Insufficient documentation

## 2012-02-21 DIAGNOSIS — E7800 Pure hypercholesterolemia, unspecified: Secondary | ICD-10-CM | POA: Insufficient documentation

## 2012-02-21 DIAGNOSIS — Z8249 Family history of ischemic heart disease and other diseases of the circulatory system: Secondary | ICD-10-CM | POA: Insufficient documentation

## 2012-02-23 ENCOUNTER — Encounter (HOSPITAL_COMMUNITY)
Admission: RE | Admit: 2012-02-23 | Discharge: 2012-02-23 | Disposition: A | Discharge status: Home / Self Care | Payer: Self-pay | Source: Ambulatory Visit | Attending: Internal Medicine | Admitting: Internal Medicine

## 2012-02-24 ENCOUNTER — Ambulatory Visit (INDEPENDENT_AMBULATORY_CARE_PROVIDER_SITE_OTHER): Payer: Medicare Other | Admitting: *Deleted

## 2012-02-24 DIAGNOSIS — Z7901 Long term (current) use of anticoagulants: Secondary | ICD-10-CM

## 2012-02-24 DIAGNOSIS — I4891 Unspecified atrial fibrillation: Secondary | ICD-10-CM

## 2012-02-24 LAB — POCT INR: INR: 2.7

## 2012-02-25 ENCOUNTER — Encounter (HOSPITAL_COMMUNITY)
Admission: RE | Admit: 2012-02-25 | Discharge: 2012-02-25 | Disposition: A | Discharge status: Home / Self Care | Payer: Self-pay | Source: Ambulatory Visit | Attending: Internal Medicine | Admitting: Internal Medicine

## 2012-02-28 ENCOUNTER — Encounter (HOSPITAL_COMMUNITY)
Admission: RE | Admit: 2012-02-28 | Discharge: 2012-02-28 | Disposition: A | Discharge status: Home / Self Care | Payer: Self-pay | Source: Ambulatory Visit | Attending: Internal Medicine | Admitting: Internal Medicine

## 2012-03-01 ENCOUNTER — Encounter (HOSPITAL_COMMUNITY): Payer: Self-pay

## 2012-03-03 ENCOUNTER — Encounter (HOSPITAL_COMMUNITY): Payer: Self-pay

## 2012-03-06 ENCOUNTER — Encounter (HOSPITAL_COMMUNITY)
Admission: RE | Admit: 2012-03-06 | Discharge: 2012-03-06 | Disposition: A | Discharge status: Home / Self Care | Payer: Self-pay | Source: Ambulatory Visit | Attending: Internal Medicine | Admitting: Internal Medicine

## 2012-03-08 ENCOUNTER — Encounter (HOSPITAL_COMMUNITY)
Admission: RE | Admit: 2012-03-08 | Discharge: 2012-03-08 | Disposition: A | Discharge status: Home / Self Care | Payer: Self-pay | Source: Ambulatory Visit | Attending: Internal Medicine | Admitting: Internal Medicine

## 2012-03-10 ENCOUNTER — Encounter (HOSPITAL_COMMUNITY)
Admission: RE | Admit: 2012-03-10 | Discharge: 2012-03-10 | Disposition: A | Discharge status: Home / Self Care | Payer: Self-pay | Source: Ambulatory Visit | Attending: Internal Medicine | Admitting: Internal Medicine

## 2012-03-13 ENCOUNTER — Encounter (HOSPITAL_COMMUNITY)
Admission: RE | Admit: 2012-03-13 | Discharge: 2012-03-13 | Disposition: A | Discharge status: Home / Self Care | Payer: Self-pay | Source: Ambulatory Visit | Attending: Internal Medicine | Admitting: Internal Medicine

## 2012-03-15 ENCOUNTER — Encounter (HOSPITAL_COMMUNITY)
Admission: RE | Admit: 2012-03-15 | Discharge: 2012-03-15 | Disposition: A | Discharge status: Home / Self Care | Payer: Self-pay | Source: Ambulatory Visit | Attending: Internal Medicine | Admitting: Internal Medicine

## 2012-03-17 ENCOUNTER — Encounter (HOSPITAL_COMMUNITY)
Admission: RE | Admit: 2012-03-17 | Discharge: 2012-03-17 | Disposition: A | Discharge status: Home / Self Care | Payer: Self-pay | Source: Ambulatory Visit | Attending: Internal Medicine | Admitting: Internal Medicine

## 2012-03-20 ENCOUNTER — Encounter (HOSPITAL_COMMUNITY): Payer: Self-pay

## 2012-03-20 DIAGNOSIS — E7800 Pure hypercholesterolemia, unspecified: Secondary | ICD-10-CM | POA: Insufficient documentation

## 2012-03-20 DIAGNOSIS — I2510 Atherosclerotic heart disease of native coronary artery without angina pectoris: Secondary | ICD-10-CM | POA: Insufficient documentation

## 2012-03-20 DIAGNOSIS — Z8249 Family history of ischemic heart disease and other diseases of the circulatory system: Secondary | ICD-10-CM | POA: Insufficient documentation

## 2012-03-20 DIAGNOSIS — Z951 Presence of aortocoronary bypass graft: Secondary | ICD-10-CM | POA: Insufficient documentation

## 2012-03-20 DIAGNOSIS — I10 Essential (primary) hypertension: Secondary | ICD-10-CM | POA: Insufficient documentation

## 2012-03-20 DIAGNOSIS — E119 Type 2 diabetes mellitus without complications: Secondary | ICD-10-CM | POA: Insufficient documentation

## 2012-03-20 DIAGNOSIS — Z5189 Encounter for other specified aftercare: Secondary | ICD-10-CM | POA: Insufficient documentation

## 2012-03-22 ENCOUNTER — Encounter (HOSPITAL_COMMUNITY)
Admission: RE | Admit: 2012-03-22 | Discharge: 2012-03-22 | Disposition: A | Discharge status: Home / Self Care | Payer: Self-pay | Source: Ambulatory Visit | Attending: Internal Medicine | Admitting: Internal Medicine

## 2012-03-24 ENCOUNTER — Encounter (HOSPITAL_COMMUNITY): Payer: Self-pay

## 2012-03-27 ENCOUNTER — Encounter (HOSPITAL_COMMUNITY)
Admission: RE | Admit: 2012-03-27 | Discharge: 2012-03-27 | Disposition: A | Discharge status: Home / Self Care | Payer: Self-pay | Source: Ambulatory Visit | Attending: Internal Medicine | Admitting: Internal Medicine

## 2012-03-29 ENCOUNTER — Encounter (HOSPITAL_COMMUNITY)
Admission: RE | Admit: 2012-03-29 | Discharge: 2012-03-29 | Disposition: A | Discharge status: Home / Self Care | Payer: Self-pay | Source: Ambulatory Visit | Attending: Internal Medicine | Admitting: Internal Medicine

## 2012-03-31 ENCOUNTER — Encounter (HOSPITAL_COMMUNITY)
Admission: RE | Admit: 2012-03-31 | Discharge: 2012-03-31 | Disposition: A | Discharge status: Home / Self Care | Payer: Self-pay | Source: Ambulatory Visit | Attending: Internal Medicine | Admitting: Internal Medicine

## 2012-04-03 ENCOUNTER — Encounter (HOSPITAL_COMMUNITY): Payer: Self-pay

## 2012-04-05 ENCOUNTER — Encounter (HOSPITAL_COMMUNITY)
Admission: RE | Admit: 2012-04-05 | Discharge: 2012-04-05 | Disposition: A | Discharge status: Home / Self Care | Payer: Self-pay | Source: Ambulatory Visit | Attending: Internal Medicine | Admitting: Internal Medicine

## 2012-04-06 ENCOUNTER — Ambulatory Visit (INDEPENDENT_AMBULATORY_CARE_PROVIDER_SITE_OTHER): Payer: Medicare Other | Admitting: Pharmacist

## 2012-04-06 DIAGNOSIS — Z7901 Long term (current) use of anticoagulants: Secondary | ICD-10-CM

## 2012-04-06 DIAGNOSIS — I4891 Unspecified atrial fibrillation: Secondary | ICD-10-CM

## 2012-04-06 LAB — POCT INR: INR: 2.3

## 2012-04-07 ENCOUNTER — Encounter (HOSPITAL_COMMUNITY)
Admission: RE | Admit: 2012-04-07 | Discharge: 2012-04-07 | Disposition: A | Discharge status: Home / Self Care | Payer: Self-pay | Source: Ambulatory Visit | Attending: Internal Medicine | Admitting: Internal Medicine

## 2012-04-10 ENCOUNTER — Encounter (HOSPITAL_COMMUNITY): Payer: Self-pay

## 2012-04-12 ENCOUNTER — Encounter (HOSPITAL_COMMUNITY)
Admission: RE | Admit: 2012-04-12 | Discharge: 2012-04-12 | Disposition: A | Discharge status: Home / Self Care | Payer: Self-pay | Source: Ambulatory Visit | Attending: Internal Medicine | Admitting: Internal Medicine

## 2012-04-13 ENCOUNTER — Ambulatory Visit (INDEPENDENT_AMBULATORY_CARE_PROVIDER_SITE_OTHER): Payer: Medicare Other | Admitting: Internal Medicine

## 2012-04-13 ENCOUNTER — Encounter: Payer: Self-pay | Admitting: Internal Medicine

## 2012-04-13 VITALS — BP 136/70 | HR 61 | Ht 68.0 in | Wt 186.2 lb

## 2012-04-13 DIAGNOSIS — I4891 Unspecified atrial fibrillation: Secondary | ICD-10-CM

## 2012-04-13 DIAGNOSIS — I5030 Unspecified diastolic (congestive) heart failure: Secondary | ICD-10-CM | POA: Insufficient documentation

## 2012-04-13 DIAGNOSIS — I509 Heart failure, unspecified: Secondary | ICD-10-CM

## 2012-04-13 DIAGNOSIS — Z950 Presence of cardiac pacemaker: Secondary | ICD-10-CM

## 2012-04-13 LAB — PACEMAKER DEVICE OBSERVATION
BRDY-0002RV: 60 {beats}/min
BRDY-0004RV: 120 {beats}/min
DEVICE MODEL PM: 2574597

## 2012-04-13 MED ORDER — FUROSEMIDE 40 MG PO TABS: 40.0000 mg | ORAL_TABLET | Freq: Every day | ORAL | Status: DC

## 2012-04-13 NOTE — Addendum Note (Signed)
Addended by: Sherri Rad C on: 04/13/2012 11:02 AM   Modules accepted: Orders, Medications

## 2012-04-13 NOTE — Progress Notes (Signed)
HPI Oscar Bruce returns today for followup. He is a pleasant 79 yo man with a h/o chronic atrial fibrillation, complete heart block, status post permanent pacemaker, congestive heart failure, who returns today for followup. In the interim, he has had increasing dyspnea particularly with exertion, and peripheral edema. He admits to sodium indiscretion. He is no longer on a diuretic except for HCTZ. The patient remains fairly sedentary. No syncope.  Allergies  Allergen Reactions  . Penicillins Swelling     Current Outpatient Prescriptions  Medication Sig Dispense Refill  . acetaminophen (TYLENOL) 500 MG chewable tablet Chew 500 mg by mouth every 6 (six) hours as needed. For pain      . atorvastatin (LIPITOR) 40 MG tablet Take 20 mg by mouth.       . B-D UF III MINI PEN NEEDLES 31G X 5 MM MISC       . Calcium Carbonate Antacid (MAALOX) 600 MG chewable tablet Chew 600 mg by mouth as needed.      . cephALEXin (KEFLEX) 500 MG capsule Take 1 capsule (500 mg total) by mouth 3 (three) times daily.  30 capsule  0  . clindamycin (CLEOCIN) 150 MG capsule Take 150 mg by mouth 4 (four) times daily. Prior to dental work      . cloNIDine (CATAPRES) 0.2 MG tablet Take 0.4 mg by mouth 3 (three) times daily.       . cyanocobalamin 100 MCG tablet Take 100 mcg by mouth daily.      . digoxin (LANOXIN) 0.25 MG tablet Take 0.125 mcg by mouth daily. One half tablet daily      . Exenatide (BYETTA 5 MCG PEN [place]) Inject 5 mg into the skin 2 (two) times daily.       . ferrous sulfate 325 (65 FE) MG EC tablet Take 325 mg by mouth daily. 1/2 tablet daily 27 mg      . furosemide (LASIX) 40 MG tablet Take 40 mg by mouth as needed.      Marland Kitchen glipiZIDE (GLUCOTROL) 5 MG tablet Take 5 mg by mouth 2 (two) times daily before a meal.        . metoprolol (TOPROL-XL) 50 MG 24 hr tablet Take 50 mg by mouth daily.        . nitroGLYCERIN (NITROSTAT) 0.4 MG SL tablet Place 0.4 mg under the tongue every 5 (five) minutes as needed.        .  ramipril (ALTACE) 5 MG capsule Take 5 mg by mouth daily.        Marland Kitchen triamterene-hydrochlorothiazide (MAXZIDE-25) 37.5-25 MG per tablet Take 1 tablet by mouth daily. Takes either a half a tablet a day or a whole tablet a day      . warfarin (COUMADIN) 5 MG tablet Take 2.5-5 mg by mouth daily. Takes 5mg  daily except on tue and thur takes 2.5mg        No current facility-administered medications for this visit.     Past Medical History  Diagnosis Date  . A-fib   . Hypertension   . CAD (coronary artery disease)   . Diabetes mellitus     ROS:   All systems reviewed and negative except as noted in the HPI.   Past Surgical History  Procedure Laterality Date  . Cholecystectomy    . Coronary artery bypass graft    . Pacemaker insertion    . Cardiac catheterization  2001, 2004  . Cataract surgery  2000     Family History  Problem Relation Age of Onset  . Diabetes Mother   . Coronary artery disease Father   . Diabetes Sister      History   Social History  . Marital Status: Widowed    Spouse Name: N/A    Number of Children: N/A  . Years of Education: N/A   Occupational History  . Not on file.   Social History Main Topics  . Smoking status: Never Smoker   . Smokeless tobacco: Not on file  . Alcohol Use: No  . Drug Use: No  . Sexually Active: Not on file   Other Topics Concern  . Not on file   Social History Narrative  . No narrative on file     BP 136/70  Pulse 61  Ht 5\' 8"  (1.727 m)  Wt 186 lb 3.2 oz (84.46 kg)  BMI 28.32 kg/m2  Physical Exam:  Well appearing elderly man,NAD HEENT: Unremarkable Neck:  8 cm JVD, no thyromegally Lungs:  Clear with no wheezes, rales, or rhonchi. HEART:  Regular rate rhythm, no murmurs, no rubs, no clicks Abd:  soft, positive bowel sounds, no organomegally, no rebound, no guarding Ext:  2 plus pulses, 3+ edema, no cyanosis, no clubbing Skin:  No rashes no nodules Neuro:  CN II through XII intact, motor grossly  intact  EKG - atrial fibrillation with ventricular pacing  DEVICE  Normal device function.  See PaceArt for details.   Assess/Plan:

## 2012-04-13 NOTE — Assessment & Plan Note (Signed)
The patient's St. Jude single chamber pacemaker is working normally. We'll plan to recheck in several months. He is pacing 99% of the time in the ventricle.

## 2012-04-13 NOTE — Assessment & Plan Note (Signed)
It is unclear as to whether he has systolic heart failure, diastolic heart care, or combination of both. It is clear that he is volume overloaded. We will restart him on Lasix. He will come back for labs next week. We will obtain a 2-D echo. He is instructed to maintain a low-sodium diet.

## 2012-04-13 NOTE — Patient Instructions (Signed)
Your physician has recommended you make the following change in your medication:  1) Stop maxide 2) Start lasix (furosemide) 40 mg one tablet by mouth twice daily (one in the morning and one after lunch) for 3 days, then decrease to one tablet by mouth once daily.  Your physician has requested that you have an echocardiogram. Echocardiography is a painless test that uses sound waves to create images of your heart. It provides your doctor with information about the size and shape of your heart and how well your heart's chambers and valves are working. This procedure takes approximately one hour. There are no restrictions for this procedure.  Your physician recommends that you return for lab work in: 1 week- BMP  Please keep your lower extremities elevated as much as possible.  Maintain a low salt diet.  Your physician recommends that you schedule a follow-up appointment in: 6-8 weeks with Dr. Ladona Ridgel.  Remote monitoring is used to monitor your Pacemaker of ICD from home. This monitoring reduces the number of office visits required to check your device to one time per year. It allows [place] to keep an eye on the functioning of your device to ensure it is working properly. You are scheduled for a device check from home on 07/17/12. You may send your transmission at any time that day. If you have a wireless device, the transmission will be sent automatically. After your physician reviews your transmission, you will receive a postcard with your next transmission date.

## 2012-04-14 ENCOUNTER — Encounter (HOSPITAL_COMMUNITY)
Admission: RE | Admit: 2012-04-14 | Discharge: 2012-04-14 | Disposition: A | Discharge status: Home / Self Care | Payer: Self-pay | Source: Ambulatory Visit | Attending: Internal Medicine | Admitting: Internal Medicine

## 2012-04-17 ENCOUNTER — Encounter (HOSPITAL_COMMUNITY)
Admission: RE | Admit: 2012-04-17 | Discharge: 2012-04-17 | Disposition: A | Discharge status: Home / Self Care | Payer: Self-pay | Source: Ambulatory Visit | Attending: Internal Medicine | Admitting: Internal Medicine

## 2012-04-19 ENCOUNTER — Encounter (HOSPITAL_COMMUNITY)
Admission: RE | Admit: 2012-04-19 | Discharge: 2012-04-19 | Disposition: A | Discharge status: Home / Self Care | Payer: Self-pay | Source: Ambulatory Visit | Attending: Internal Medicine | Admitting: Internal Medicine

## 2012-04-19 DIAGNOSIS — Z5189 Encounter for other specified aftercare: Secondary | ICD-10-CM | POA: Insufficient documentation

## 2012-04-19 DIAGNOSIS — E7800 Pure hypercholesterolemia, unspecified: Secondary | ICD-10-CM | POA: Insufficient documentation

## 2012-04-19 DIAGNOSIS — E119 Type 2 diabetes mellitus without complications: Secondary | ICD-10-CM | POA: Insufficient documentation

## 2012-04-19 DIAGNOSIS — Z951 Presence of aortocoronary bypass graft: Secondary | ICD-10-CM | POA: Insufficient documentation

## 2012-04-19 DIAGNOSIS — I2510 Atherosclerotic heart disease of native coronary artery without angina pectoris: Secondary | ICD-10-CM | POA: Insufficient documentation

## 2012-04-19 DIAGNOSIS — Z8249 Family history of ischemic heart disease and other diseases of the circulatory system: Secondary | ICD-10-CM | POA: Insufficient documentation

## 2012-04-19 DIAGNOSIS — I10 Essential (primary) hypertension: Secondary | ICD-10-CM | POA: Insufficient documentation

## 2012-04-20 ENCOUNTER — Other Ambulatory Visit: Payer: Self-pay

## 2012-04-20 ENCOUNTER — Ambulatory Visit (HOSPITAL_COMMUNITY): Payer: Medicare Other | Attending: Cardiology | Admitting: Radiology

## 2012-04-20 ENCOUNTER — Other Ambulatory Visit (INDEPENDENT_AMBULATORY_CARE_PROVIDER_SITE_OTHER): Payer: Medicare Other

## 2012-04-20 DIAGNOSIS — R0989 Other specified symptoms and signs involving the circulatory and respiratory systems: Secondary | ICD-10-CM | POA: Insufficient documentation

## 2012-04-20 DIAGNOSIS — I4891 Unspecified atrial fibrillation: Secondary | ICD-10-CM

## 2012-04-20 DIAGNOSIS — I509 Heart failure, unspecified: Secondary | ICD-10-CM

## 2012-04-20 DIAGNOSIS — R0609 Other forms of dyspnea: Secondary | ICD-10-CM | POA: Insufficient documentation

## 2012-04-20 LAB — BASIC METABOLIC PANEL
CO2: 28 mEq/L (ref 19–32)
Calcium: 8.3 mg/dL — ABNORMAL LOW (ref 8.4–10.5)
Chloride: 98 mEq/L (ref 96–112)
Creatinine, Ser: 1.3 mg/dL (ref 0.4–1.5)
Glucose, Bld: 220 mg/dL — ABNORMAL HIGH (ref 70–99)
Sodium: 134 mEq/L — ABNORMAL LOW (ref 135–145)

## 2012-04-20 NOTE — Progress Notes (Signed)
Echocardiogram performed.  

## 2012-04-21 ENCOUNTER — Encounter (HOSPITAL_COMMUNITY)
Admission: RE | Admit: 2012-04-21 | Discharge: 2012-04-21 | Disposition: A | Discharge status: Home / Self Care | Payer: Self-pay | Source: Ambulatory Visit | Attending: Internal Medicine | Admitting: Internal Medicine

## 2012-04-24 ENCOUNTER — Encounter (HOSPITAL_COMMUNITY)
Admission: RE | Admit: 2012-04-24 | Discharge: 2012-04-24 | Disposition: A | Discharge status: Home / Self Care | Payer: Self-pay | Source: Ambulatory Visit | Attending: Internal Medicine | Admitting: Internal Medicine

## 2012-04-26 ENCOUNTER — Encounter (HOSPITAL_COMMUNITY): Payer: Self-pay

## 2012-04-28 ENCOUNTER — Encounter (HOSPITAL_COMMUNITY)
Admission: RE | Admit: 2012-04-28 | Discharge: 2012-04-28 | Disposition: A | Discharge status: Home / Self Care | Payer: Self-pay | Source: Ambulatory Visit | Attending: Internal Medicine | Admitting: Internal Medicine

## 2012-05-01 ENCOUNTER — Encounter (HOSPITAL_COMMUNITY)
Admission: RE | Admit: 2012-05-01 | Discharge: 2012-05-01 | Disposition: A | Discharge status: Home / Self Care | Payer: Self-pay | Source: Ambulatory Visit | Attending: Internal Medicine | Admitting: Internal Medicine

## 2012-05-03 ENCOUNTER — Encounter (HOSPITAL_COMMUNITY): Payer: Self-pay

## 2012-05-05 ENCOUNTER — Encounter (HOSPITAL_COMMUNITY): Payer: Self-pay

## 2012-05-08 ENCOUNTER — Encounter (HOSPITAL_COMMUNITY)
Admission: RE | Admit: 2012-05-08 | Discharge: 2012-05-08 | Disposition: A | Discharge status: Home / Self Care | Payer: Self-pay | Source: Ambulatory Visit | Attending: Internal Medicine | Admitting: Internal Medicine

## 2012-05-10 ENCOUNTER — Encounter (HOSPITAL_COMMUNITY)
Admission: RE | Admit: 2012-05-10 | Discharge: 2012-05-10 | Disposition: A | Discharge status: Home / Self Care | Payer: Self-pay | Source: Ambulatory Visit | Attending: Internal Medicine | Admitting: Internal Medicine

## 2012-05-12 ENCOUNTER — Encounter (HOSPITAL_COMMUNITY)
Admission: RE | Admit: 2012-05-12 | Discharge: 2012-05-12 | Disposition: A | Discharge status: Home / Self Care | Payer: Self-pay | Source: Ambulatory Visit | Attending: Internal Medicine | Admitting: Internal Medicine

## 2012-05-15 ENCOUNTER — Encounter (HOSPITAL_COMMUNITY)
Admission: RE | Admit: 2012-05-15 | Discharge: 2012-05-15 | Disposition: A | Discharge status: Home / Self Care | Payer: Self-pay | Source: Ambulatory Visit | Attending: Internal Medicine | Admitting: Internal Medicine

## 2012-05-17 ENCOUNTER — Encounter (HOSPITAL_COMMUNITY)
Admission: RE | Admit: 2012-05-17 | Discharge: 2012-05-17 | Disposition: A | Discharge status: Home / Self Care | Payer: Self-pay | Source: Ambulatory Visit | Attending: Internal Medicine | Admitting: Internal Medicine

## 2012-05-18 ENCOUNTER — Encounter: Payer: Self-pay | Admitting: Internal Medicine

## 2012-05-18 ENCOUNTER — Ambulatory Visit (INDEPENDENT_AMBULATORY_CARE_PROVIDER_SITE_OTHER): Payer: Medicare Other | Admitting: Internal Medicine

## 2012-05-18 ENCOUNTER — Ambulatory Visit (INDEPENDENT_AMBULATORY_CARE_PROVIDER_SITE_OTHER): Payer: Medicare Other | Admitting: *Deleted

## 2012-05-18 VITALS — BP 159/63 | HR 61 | Wt 182.1 lb

## 2012-05-18 DIAGNOSIS — I4891 Unspecified atrial fibrillation: Secondary | ICD-10-CM

## 2012-05-18 DIAGNOSIS — Z950 Presence of cardiac pacemaker: Secondary | ICD-10-CM

## 2012-05-18 DIAGNOSIS — Z7901 Long term (current) use of anticoagulants: Secondary | ICD-10-CM

## 2012-05-18 DIAGNOSIS — I509 Heart failure, unspecified: Secondary | ICD-10-CM

## 2012-05-18 LAB — PACEMAKER DEVICE OBSERVATION
BATTERY VOLTAGE: 2.98 V
BMOD-0002RV: 8
BRDY-0004RV: 120 {beats}/min
DEVICE MODEL PM: 2574597
RV LEAD THRESHOLD: 0.875 V
VENTRICULAR PACING PM: 99

## 2012-05-18 MED ORDER — FUROSEMIDE 40 MG PO TABS: ORAL_TABLET | ORAL | Status: DC

## 2012-05-18 MED ORDER — CEPHALEXIN 500 MG PO CAPS: 500.0000 mg | ORAL_CAPSULE | Freq: Three times a day (TID) | ORAL | Status: DC

## 2012-05-18 NOTE — Assessment & Plan Note (Signed)
His St. Jude single chamber pacemaker is working normally. We'll plan to recheck in several months. He is pacing approximately 99% of the time.

## 2012-05-18 NOTE — Progress Notes (Signed)
HPI Mr. Wirz returns today for followup. He is a very pleasant 79 year old man with a history of symptomatic bradycardia, chronic atrial fibrillation, complete heart block, status post pacemaker insertion. He complains of increasing peripheral edema. He admits to sodium indiscretion. He has had problems with cellulitis in his lower extremities. He also has venous insufficiency. Allergies  Allergen Reactions  . Penicillins Swelling     Current Outpatient Prescriptions  Medication Sig Dispense Refill  . acetaminophen (TYLENOL) 500 MG chewable tablet Chew 500 mg by mouth every 6 (six) hours as needed. For pain      . atorvastatin (LIPITOR) 40 MG tablet Take 20 mg by mouth.       . B-D UF III MINI PEN NEEDLES 31G X 5 MM MISC       . Calcium Carbonate Antacid (MAALOX) 600 MG chewable tablet Chew 600 mg by mouth as needed.      . cephALEXin (KEFLEX) 500 MG capsule Take 1 capsule (500 mg total) by mouth 3 (three) times daily.  63 capsule  0  . clindamycin (CLEOCIN) 150 MG capsule Take 150 mg by mouth 4 (four) times daily. Prior to dental work      . cloNIDine (CATAPRES) 0.2 MG tablet Take 0.4 mg by mouth 3 (three) times daily.       . cyanocobalamin 100 MCG tablet Take 100 mcg by mouth daily.      . digoxin (LANOXIN) 0.25 MG tablet Take 0.125 mcg by mouth daily. One half tablet daily      . Exenatide (BYETTA 5 MCG PEN [place]) Inject 5 mg into the skin 2 (two) times daily.       . ferrous sulfate 325 (65 FE) MG EC tablet Take 325 mg by mouth daily. 1/2 tablet daily 27 mg      . furosemide (LASIX) 40 MG tablet Take 1 1/2 tablets by mouth daily  45 tablet  11  . glipiZIDE (GLUCOTROL) 5 MG tablet Take 5 mg by mouth 2 (two) times daily before a meal.        . metoprolol (TOPROL-XL) 50 MG 24 hr tablet Take 50 mg by mouth daily.        . nitroGLYCERIN (NITROSTAT) 0.4 MG SL tablet Place 0.4 mg under the tongue every 5 (five) minutes as needed.        . ramipril (ALTACE) 5 MG capsule Take 5 mg by mouth daily.         Marland Kitchen warfarin (COUMADIN) 5 MG tablet Take 2.5-5 mg by mouth daily. Takes 5mg  daily except on tue and thur takes 2.5mg        No current facility-administered medications for this visit.     Past Medical History  Diagnosis Date  . A-fib   . Hypertension   . CAD (coronary artery disease)   . Diabetes mellitus     ROS:   All systems reviewed and negative except as noted in the HPI.   Past Surgical History  Procedure Laterality Date  . Cholecystectomy    . Coronary artery bypass graft    . Pacemaker insertion    . Cardiac catheterization  2001, 2004  . Cataract surgery  2000     Family History  Problem Relation Age of Onset  . Diabetes Mother   . Coronary artery disease Father   . Diabetes Sister      History   Social History  . Marital Status: Widowed    Spouse Name: N/A    Number of  Children: N/A  . Years of Education: N/A   Occupational History  . Not on file.   Social History Main Topics  . Smoking status: Never Smoker   . Smokeless tobacco: Not on file  . Alcohol Use: No  . Drug Use: No  . Sexually Active: Not on file   Other Topics Concern  . Not on file   Social History Narrative  . No narrative on file     BP 159/63  Pulse 61  Wt 182 lb 1.9 oz (82.609 kg)  BMI 27.7 kg/m2  Physical Exam:  elderly appearing 79 year old man,NAD HEENT: Unremarkable Neck:  8 cm JVD, no thyromegally Back:  No CVA tenderness Lungs:  Clear with no wheezes, rales, or rhonchi. HEART:  Regular rate rhythm, no murmurs, no rubs, no clicks, soft S4 gallop, questionable right-sided Abd:  soft, positive bowel sounds, no organomegally, no rebound, no guarding Ext:  2 plus pulses, 3+ peripheral edema, no cyanosis, no clubbing Skin:  No rashes no nodules Neuro:  CN II through XII intact, motor grossly intact  DEVICE  Normal device function.  See PaceArt for details.   Assess/Plan:

## 2012-05-18 NOTE — Assessment & Plan Note (Signed)
He still has significant peripheral edema. I've asked the patient to increase his Lasix from 40 mg daily to 60 mg daily.

## 2012-05-18 NOTE — Patient Instructions (Addendum)
Your physician wants you to follow-up in: 12 months with DrTaylor You will receive a reminder letter in the mail two months in advance. If you don't receive a letter, please call our office to schedule the follow-up appointment.   Remote monitoring is used to monitor your Pacemaker or ICD from home. This monitoring reduces the number of office visits required to check your device to one time per year. It allows [place] to keep an eye on the functioning of your device to ensure it is working properly. You are scheduled for a device check from home on 08/21/12 You may send your transmission at any time that day. If you have a wireless device, the transmission will be sent automatically. After your physician reviews your transmission, you will receive a postcard with your next transmission date.   Your physician has recommended you make the following change in your medication: 1) Increase Furosemide to 60mg  daily

## 2012-05-19 ENCOUNTER — Encounter (HOSPITAL_COMMUNITY)
Admission: RE | Admit: 2012-05-19 | Discharge: 2012-05-19 | Disposition: A | Discharge status: Home / Self Care | Payer: Self-pay | Source: Ambulatory Visit | Attending: Internal Medicine | Admitting: Internal Medicine

## 2012-05-19 DIAGNOSIS — E119 Type 2 diabetes mellitus without complications: Secondary | ICD-10-CM | POA: Insufficient documentation

## 2012-05-19 DIAGNOSIS — E7800 Pure hypercholesterolemia, unspecified: Secondary | ICD-10-CM | POA: Insufficient documentation

## 2012-05-19 DIAGNOSIS — Z8249 Family history of ischemic heart disease and other diseases of the circulatory system: Secondary | ICD-10-CM | POA: Insufficient documentation

## 2012-05-19 DIAGNOSIS — I2510 Atherosclerotic heart disease of native coronary artery without angina pectoris: Secondary | ICD-10-CM | POA: Insufficient documentation

## 2012-05-19 DIAGNOSIS — I10 Essential (primary) hypertension: Secondary | ICD-10-CM | POA: Insufficient documentation

## 2012-05-19 DIAGNOSIS — Z5189 Encounter for other specified aftercare: Secondary | ICD-10-CM | POA: Insufficient documentation

## 2012-05-19 DIAGNOSIS — Z951 Presence of aortocoronary bypass graft: Secondary | ICD-10-CM | POA: Insufficient documentation

## 2012-05-21 IMAGING — CR DG CHEST 2V
2 series · 2 of 2 positions shown · non-contrast
Comparison: None.

CLINICAL DATA: Coronary artery disease, changing of permanent
pacemaker

CHEST - 2 VIEW

[view not recorded (1 of 2)]
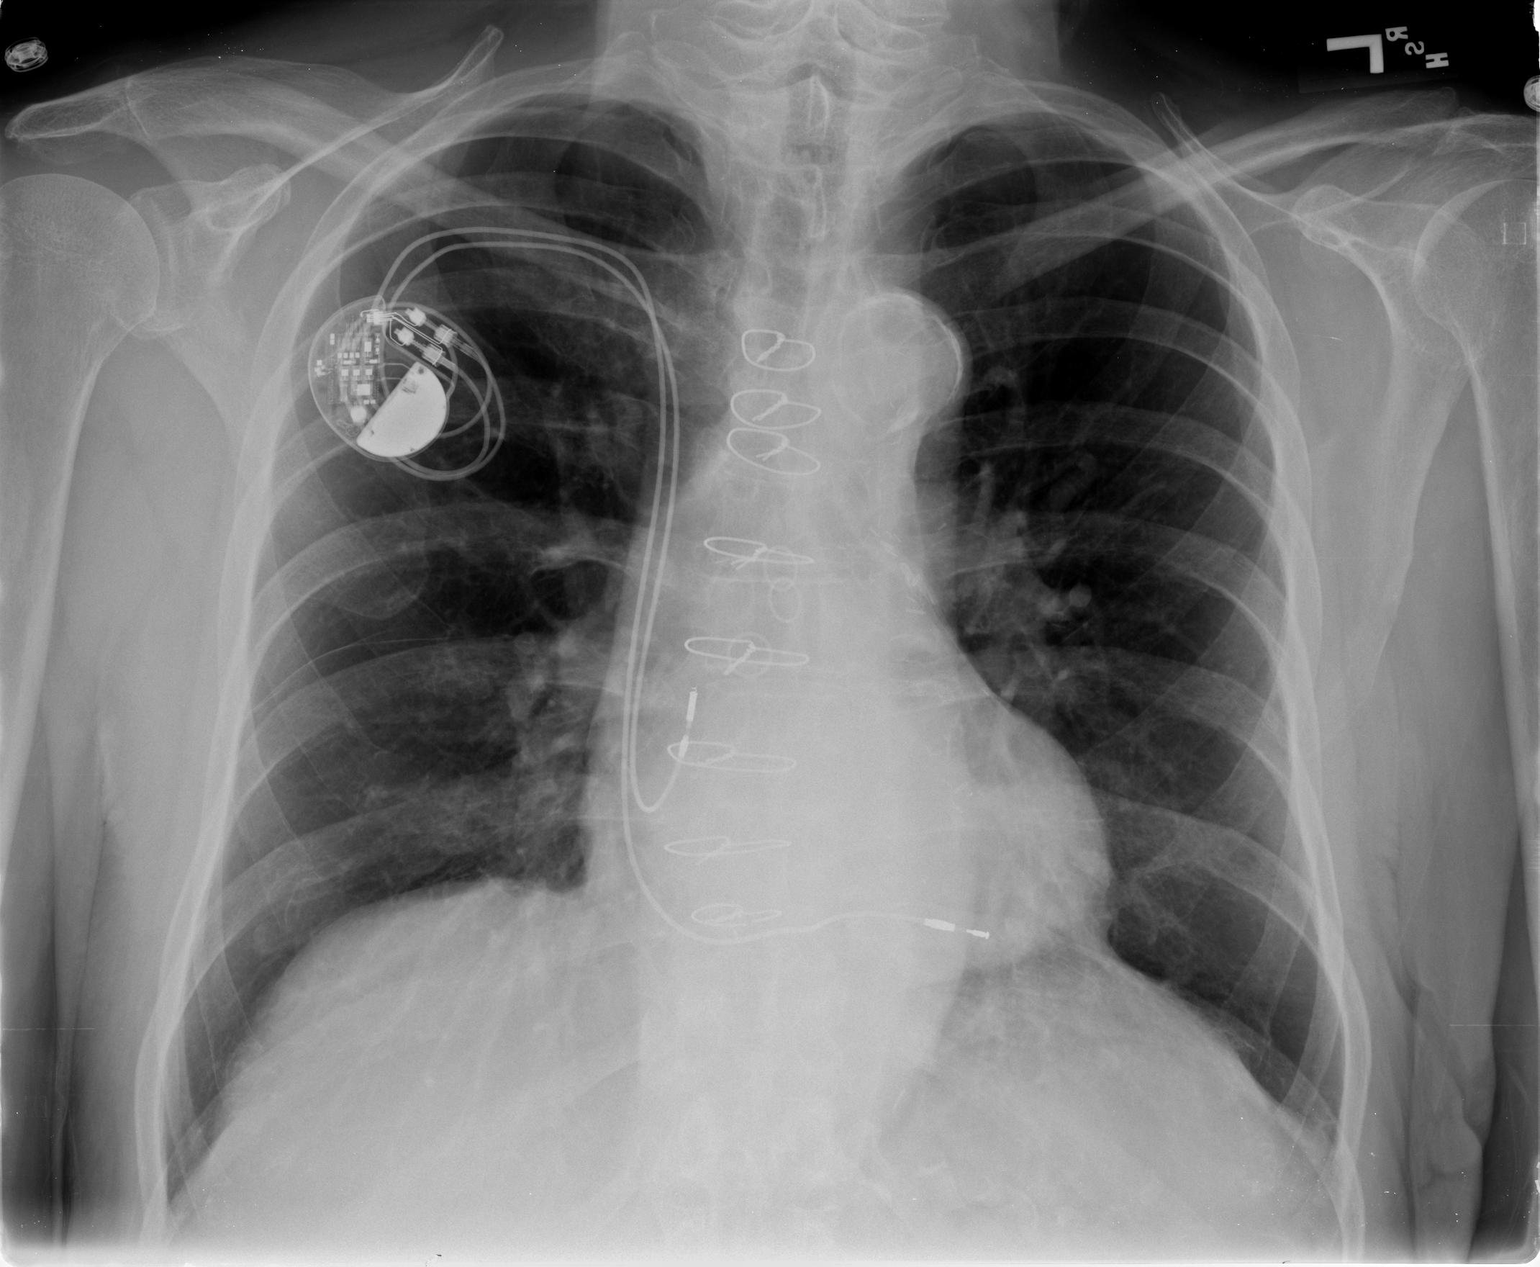

[view not recorded (2 of 2)]
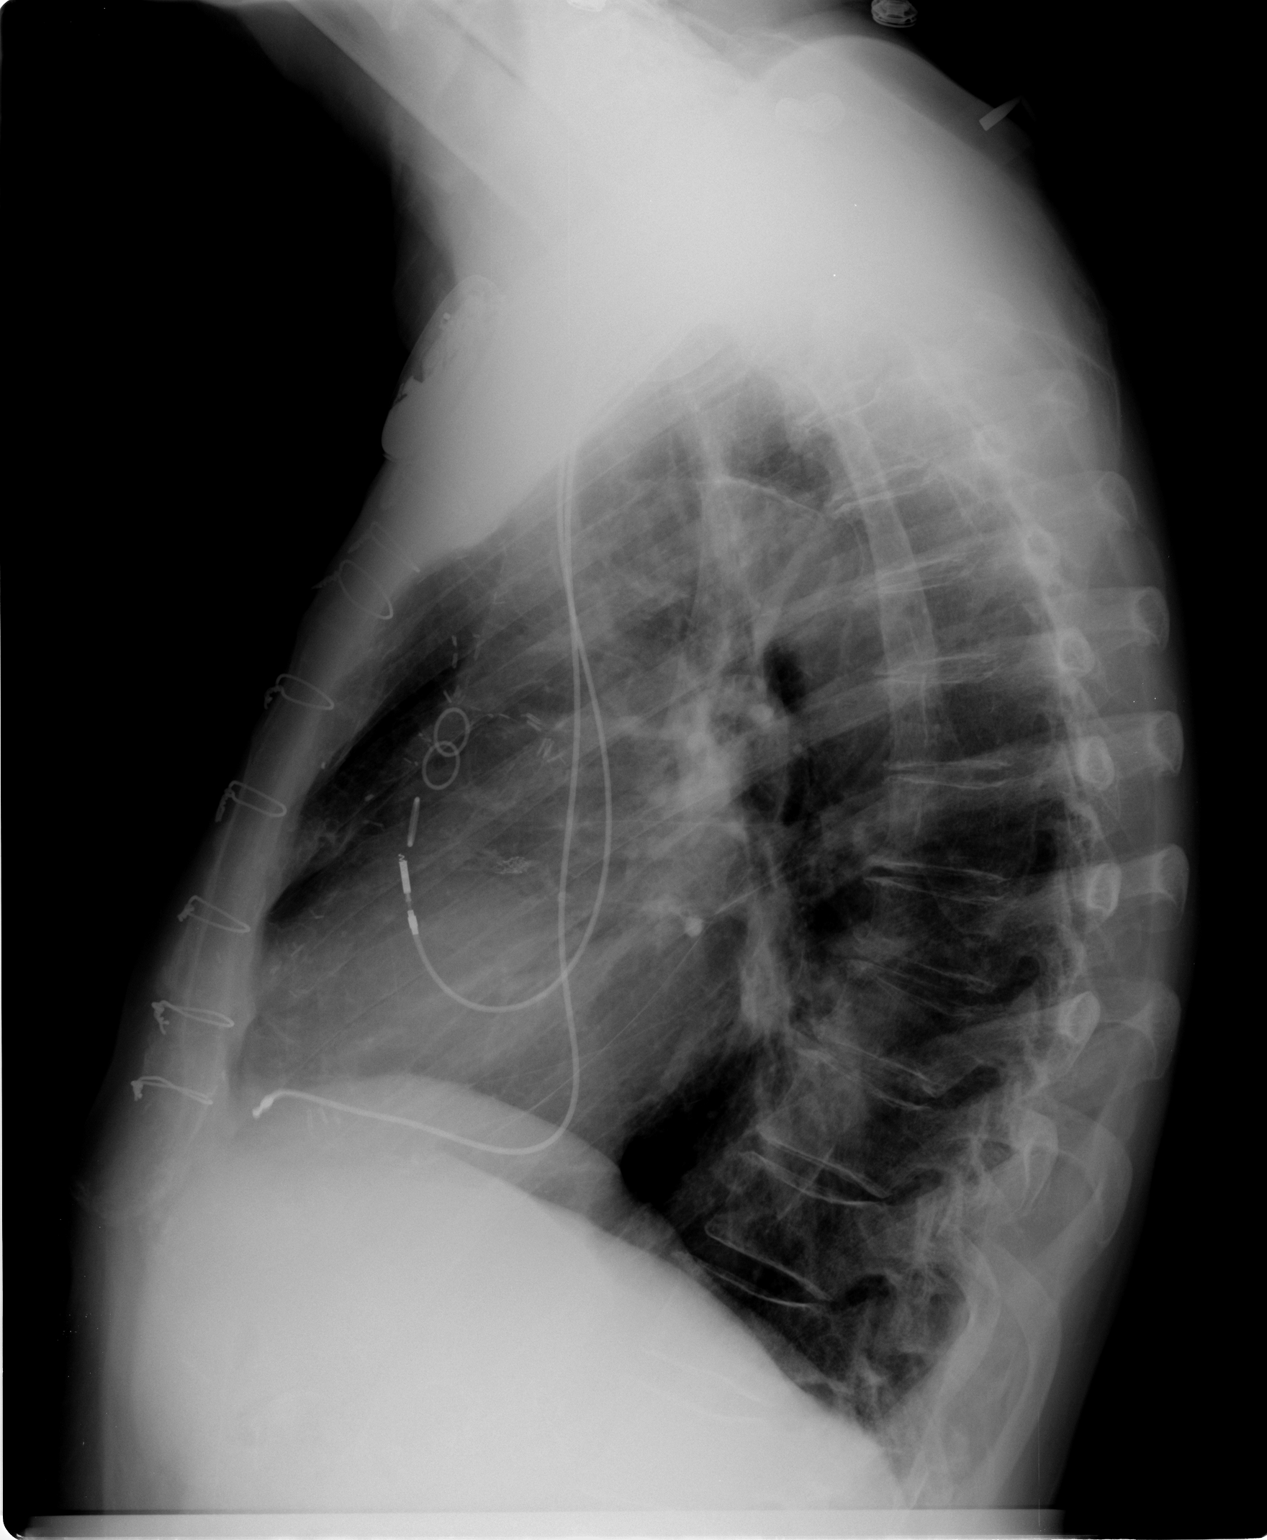

[2 of 2 positions shown; findings below may reference images not displayed]

FINDINGS: The lungs are clear.  Mediastinal contours appear normal.
The heart is within upper limits normal.  The dual lead permanent
pacemaker is present.  Median sternotomy sutures are noted.  There
are degenerative changes throughout the thoracic spine.
IMPRESSION: Permanent pacemaker.  No active lung disease.

## 2012-05-22 ENCOUNTER — Encounter (HOSPITAL_COMMUNITY)
Admission: RE | Admit: 2012-05-22 | Discharge: 2012-05-22 | Disposition: A | Discharge status: Home / Self Care | Payer: Self-pay | Source: Ambulatory Visit | Attending: Internal Medicine | Admitting: Internal Medicine

## 2012-05-24 ENCOUNTER — Encounter (HOSPITAL_COMMUNITY)
Admission: RE | Admit: 2012-05-24 | Discharge: 2012-05-24 | Disposition: A | Discharge status: Home / Self Care | Payer: Self-pay | Source: Ambulatory Visit | Attending: Internal Medicine | Admitting: Internal Medicine

## 2012-05-26 ENCOUNTER — Encounter (HOSPITAL_COMMUNITY)
Admission: RE | Admit: 2012-05-26 | Discharge: 2012-05-26 | Disposition: A | Discharge status: Home / Self Care | Payer: Self-pay | Source: Ambulatory Visit | Attending: Internal Medicine | Admitting: Internal Medicine

## 2012-05-29 ENCOUNTER — Encounter (HOSPITAL_COMMUNITY)
Admission: RE | Admit: 2012-05-29 | Discharge: 2012-05-29 | Disposition: A | Discharge status: Home / Self Care | Payer: Self-pay | Source: Ambulatory Visit | Attending: Internal Medicine | Admitting: Internal Medicine

## 2012-05-31 ENCOUNTER — Encounter (HOSPITAL_COMMUNITY)
Admission: RE | Admit: 2012-05-31 | Discharge: 2012-05-31 | Disposition: A | Discharge status: Home / Self Care | Payer: Self-pay | Source: Ambulatory Visit | Attending: Internal Medicine | Admitting: Internal Medicine

## 2012-06-02 ENCOUNTER — Encounter (HOSPITAL_COMMUNITY)
Admission: RE | Admit: 2012-06-02 | Discharge: 2012-06-02 | Disposition: A | Discharge status: Home / Self Care | Payer: Self-pay | Source: Ambulatory Visit | Attending: Internal Medicine | Admitting: Internal Medicine

## 2012-06-05 ENCOUNTER — Encounter (HOSPITAL_COMMUNITY)
Admission: RE | Admit: 2012-06-05 | Discharge: 2012-06-05 | Disposition: A | Discharge status: Home / Self Care | Payer: Self-pay | Source: Ambulatory Visit | Attending: Internal Medicine | Admitting: Internal Medicine

## 2012-06-07 ENCOUNTER — Encounter (HOSPITAL_COMMUNITY)
Admission: RE | Admit: 2012-06-07 | Discharge: 2012-06-07 | Disposition: A | Discharge status: Home / Self Care | Payer: Self-pay | Source: Ambulatory Visit | Attending: Internal Medicine | Admitting: Internal Medicine

## 2012-06-09 ENCOUNTER — Encounter (HOSPITAL_COMMUNITY)
Admission: RE | Admit: 2012-06-09 | Discharge: 2012-06-09 | Disposition: A | Discharge status: Home / Self Care | Payer: Self-pay | Source: Ambulatory Visit | Attending: Internal Medicine | Admitting: Internal Medicine

## 2012-06-14 ENCOUNTER — Encounter (HOSPITAL_COMMUNITY)
Admission: RE | Admit: 2012-06-14 | Discharge: 2012-06-14 | Disposition: A | Discharge status: Home / Self Care | Payer: Self-pay | Source: Ambulatory Visit | Attending: Internal Medicine | Admitting: Internal Medicine

## 2012-06-16 ENCOUNTER — Encounter (HOSPITAL_COMMUNITY)
Admission: RE | Admit: 2012-06-16 | Discharge: 2012-06-16 | Disposition: A | Discharge status: Home / Self Care | Payer: Self-pay | Source: Ambulatory Visit | Attending: Internal Medicine | Admitting: Internal Medicine

## 2012-06-19 ENCOUNTER — Encounter (HOSPITAL_COMMUNITY)
Admission: RE | Admit: 2012-06-19 | Discharge: 2012-06-19 | Disposition: A | Discharge status: Home / Self Care | Payer: Self-pay | Source: Ambulatory Visit | Attending: Internal Medicine | Admitting: Internal Medicine

## 2012-06-19 DIAGNOSIS — Z8249 Family history of ischemic heart disease and other diseases of the circulatory system: Secondary | ICD-10-CM | POA: Insufficient documentation

## 2012-06-19 DIAGNOSIS — E119 Type 2 diabetes mellitus without complications: Secondary | ICD-10-CM | POA: Insufficient documentation

## 2012-06-19 DIAGNOSIS — Z951 Presence of aortocoronary bypass graft: Secondary | ICD-10-CM | POA: Insufficient documentation

## 2012-06-19 DIAGNOSIS — Z5189 Encounter for other specified aftercare: Secondary | ICD-10-CM | POA: Insufficient documentation

## 2012-06-19 DIAGNOSIS — E7800 Pure hypercholesterolemia, unspecified: Secondary | ICD-10-CM | POA: Insufficient documentation

## 2012-06-19 DIAGNOSIS — I2510 Atherosclerotic heart disease of native coronary artery without angina pectoris: Secondary | ICD-10-CM | POA: Insufficient documentation

## 2012-06-19 DIAGNOSIS — I10 Essential (primary) hypertension: Secondary | ICD-10-CM | POA: Insufficient documentation

## 2012-06-21 ENCOUNTER — Encounter (HOSPITAL_COMMUNITY)
Admission: RE | Admit: 2012-06-21 | Discharge: 2012-06-21 | Disposition: A | Discharge status: Home / Self Care | Payer: Self-pay | Source: Ambulatory Visit | Attending: Internal Medicine | Admitting: Internal Medicine

## 2012-06-23 ENCOUNTER — Encounter (HOSPITAL_COMMUNITY)
Admission: RE | Admit: 2012-06-23 | Discharge: 2012-06-23 | Disposition: A | Discharge status: Home / Self Care | Payer: Self-pay | Source: Ambulatory Visit | Attending: Internal Medicine | Admitting: Internal Medicine

## 2012-06-26 ENCOUNTER — Encounter (HOSPITAL_COMMUNITY)
Admission: RE | Admit: 2012-06-26 | Discharge: 2012-06-26 | Disposition: A | Discharge status: Home / Self Care | Payer: Self-pay | Source: Ambulatory Visit | Attending: Internal Medicine | Admitting: Internal Medicine

## 2012-06-28 ENCOUNTER — Encounter (HOSPITAL_COMMUNITY)
Admission: RE | Admit: 2012-06-28 | Discharge: 2012-06-28 | Disposition: A | Discharge status: Home / Self Care | Payer: Self-pay | Source: Ambulatory Visit | Attending: Internal Medicine | Admitting: Internal Medicine

## 2012-06-29 ENCOUNTER — Ambulatory Visit (INDEPENDENT_AMBULATORY_CARE_PROVIDER_SITE_OTHER): Payer: Medicare Other

## 2012-06-29 DIAGNOSIS — I4891 Unspecified atrial fibrillation: Secondary | ICD-10-CM

## 2012-06-29 DIAGNOSIS — Z7901 Long term (current) use of anticoagulants: Secondary | ICD-10-CM

## 2012-06-29 LAB — POCT INR: INR: 2.8

## 2012-06-30 ENCOUNTER — Encounter (HOSPITAL_COMMUNITY): Payer: Self-pay

## 2012-07-03 ENCOUNTER — Encounter (HOSPITAL_COMMUNITY)
Admission: RE | Admit: 2012-07-03 | Discharge: 2012-07-03 | Disposition: A | Discharge status: Home / Self Care | Payer: Self-pay | Source: Ambulatory Visit | Attending: Internal Medicine | Admitting: Internal Medicine

## 2012-07-05 ENCOUNTER — Encounter (HOSPITAL_COMMUNITY)
Admission: RE | Admit: 2012-07-05 | Discharge: 2012-07-05 | Disposition: A | Discharge status: Home / Self Care | Payer: Self-pay | Source: Ambulatory Visit | Attending: Internal Medicine | Admitting: Internal Medicine

## 2012-07-05 ENCOUNTER — Other Ambulatory Visit: Payer: Self-pay | Admitting: *Deleted

## 2012-07-05 MED ORDER — CLONIDINE HCL 0.2 MG PO TABS: 0.4000 mg | ORAL_TABLET | Freq: Three times a day (TID) | ORAL | Status: DC

## 2012-07-07 ENCOUNTER — Encounter (HOSPITAL_COMMUNITY)
Admission: RE | Admit: 2012-07-07 | Discharge: 2012-07-07 | Disposition: A | Discharge status: Home / Self Care | Payer: Self-pay | Source: Ambulatory Visit | Attending: Internal Medicine | Admitting: Internal Medicine

## 2012-07-10 ENCOUNTER — Encounter (HOSPITAL_COMMUNITY)
Admission: RE | Admit: 2012-07-10 | Discharge: 2012-07-10 | Disposition: A | Discharge status: Home / Self Care | Payer: Self-pay | Source: Ambulatory Visit | Attending: Internal Medicine | Admitting: Internal Medicine

## 2012-07-10 ENCOUNTER — Other Ambulatory Visit: Payer: Self-pay | Admitting: Internal Medicine

## 2012-07-12 ENCOUNTER — Encounter (HOSPITAL_COMMUNITY)
Admission: RE | Admit: 2012-07-12 | Discharge: 2012-07-12 | Disposition: A | Discharge status: Home / Self Care | Payer: Self-pay | Source: Ambulatory Visit | Attending: Internal Medicine | Admitting: Internal Medicine

## 2012-07-14 ENCOUNTER — Encounter (HOSPITAL_COMMUNITY): Payer: Self-pay

## 2012-07-17 ENCOUNTER — Encounter (HOSPITAL_COMMUNITY): Payer: Self-pay

## 2012-07-19 ENCOUNTER — Encounter (HOSPITAL_COMMUNITY): Payer: Self-pay

## 2012-07-19 DIAGNOSIS — Z8249 Family history of ischemic heart disease and other diseases of the circulatory system: Secondary | ICD-10-CM | POA: Insufficient documentation

## 2012-07-19 DIAGNOSIS — E7800 Pure hypercholesterolemia, unspecified: Secondary | ICD-10-CM | POA: Insufficient documentation

## 2012-07-19 DIAGNOSIS — I10 Essential (primary) hypertension: Secondary | ICD-10-CM | POA: Insufficient documentation

## 2012-07-19 DIAGNOSIS — I2510 Atherosclerotic heart disease of native coronary artery without angina pectoris: Secondary | ICD-10-CM | POA: Insufficient documentation

## 2012-07-19 DIAGNOSIS — Z951 Presence of aortocoronary bypass graft: Secondary | ICD-10-CM | POA: Insufficient documentation

## 2012-07-19 DIAGNOSIS — E119 Type 2 diabetes mellitus without complications: Secondary | ICD-10-CM | POA: Insufficient documentation

## 2012-07-19 DIAGNOSIS — Z5189 Encounter for other specified aftercare: Secondary | ICD-10-CM | POA: Insufficient documentation

## 2012-07-24 ENCOUNTER — Encounter (HOSPITAL_COMMUNITY): Payer: Self-pay

## 2012-07-26 ENCOUNTER — Encounter (HOSPITAL_COMMUNITY): Payer: Self-pay

## 2012-07-28 ENCOUNTER — Encounter (HOSPITAL_COMMUNITY): Payer: Self-pay

## 2012-07-31 ENCOUNTER — Encounter (HOSPITAL_COMMUNITY)
Admission: RE | Admit: 2012-07-31 | Discharge: 2012-07-31 | Disposition: A | Discharge status: Home / Self Care | Payer: Self-pay | Source: Ambulatory Visit | Attending: Internal Medicine | Admitting: Internal Medicine

## 2012-08-02 ENCOUNTER — Encounter (HOSPITAL_COMMUNITY)
Admission: RE | Admit: 2012-08-02 | Discharge: 2012-08-02 | Disposition: A | Discharge status: Home / Self Care | Payer: Self-pay | Source: Ambulatory Visit | Attending: Internal Medicine | Admitting: Internal Medicine

## 2012-08-04 ENCOUNTER — Encounter (HOSPITAL_COMMUNITY)
Admission: RE | Admit: 2012-08-04 | Discharge: 2012-08-04 | Disposition: A | Discharge status: Home / Self Care | Payer: Self-pay | Source: Ambulatory Visit | Attending: Internal Medicine | Admitting: Internal Medicine

## 2012-08-07 ENCOUNTER — Encounter (HOSPITAL_COMMUNITY)
Admission: RE | Admit: 2012-08-07 | Discharge: 2012-08-07 | Disposition: A | Discharge status: Home / Self Care | Payer: Self-pay | Source: Ambulatory Visit | Attending: Internal Medicine | Admitting: Internal Medicine

## 2012-08-09 ENCOUNTER — Encounter (HOSPITAL_COMMUNITY)
Admission: RE | Admit: 2012-08-09 | Discharge: 2012-08-09 | Disposition: A | Discharge status: Home / Self Care | Payer: Self-pay | Source: Ambulatory Visit | Attending: Internal Medicine | Admitting: Internal Medicine

## 2012-08-10 ENCOUNTER — Ambulatory Visit (INDEPENDENT_AMBULATORY_CARE_PROVIDER_SITE_OTHER): Payer: Medicare Other | Admitting: *Deleted

## 2012-08-10 DIAGNOSIS — I4891 Unspecified atrial fibrillation: Secondary | ICD-10-CM

## 2012-08-10 DIAGNOSIS — Z7901 Long term (current) use of anticoagulants: Secondary | ICD-10-CM

## 2012-08-10 LAB — POCT INR: INR: 1.7

## 2012-08-11 ENCOUNTER — Encounter (HOSPITAL_COMMUNITY)
Admission: RE | Admit: 2012-08-11 | Discharge: 2012-08-11 | Disposition: A | Discharge status: Home / Self Care | Payer: Self-pay | Source: Ambulatory Visit | Attending: Internal Medicine | Admitting: Internal Medicine

## 2012-08-14 ENCOUNTER — Encounter (HOSPITAL_COMMUNITY)
Admission: RE | Admit: 2012-08-14 | Discharge: 2012-08-14 | Disposition: A | Discharge status: Home / Self Care | Payer: Self-pay | Source: Ambulatory Visit | Attending: Internal Medicine | Admitting: Internal Medicine

## 2012-08-16 ENCOUNTER — Encounter (HOSPITAL_COMMUNITY)
Admission: RE | Admit: 2012-08-16 | Discharge: 2012-08-16 | Disposition: A | Discharge status: Home / Self Care | Payer: Self-pay | Source: Ambulatory Visit | Attending: Internal Medicine | Admitting: Internal Medicine

## 2012-08-17 DIAGNOSIS — Z950 Presence of cardiac pacemaker: Secondary | ICD-10-CM

## 2012-08-17 DIAGNOSIS — I4891 Unspecified atrial fibrillation: Secondary | ICD-10-CM

## 2012-08-18 ENCOUNTER — Encounter (HOSPITAL_COMMUNITY): Payer: Self-pay

## 2012-08-18 DIAGNOSIS — Z8249 Family history of ischemic heart disease and other diseases of the circulatory system: Secondary | ICD-10-CM | POA: Insufficient documentation

## 2012-08-18 DIAGNOSIS — E7800 Pure hypercholesterolemia, unspecified: Secondary | ICD-10-CM | POA: Insufficient documentation

## 2012-08-18 DIAGNOSIS — Z5189 Encounter for other specified aftercare: Secondary | ICD-10-CM | POA: Insufficient documentation

## 2012-08-18 DIAGNOSIS — E119 Type 2 diabetes mellitus without complications: Secondary | ICD-10-CM | POA: Insufficient documentation

## 2012-08-18 DIAGNOSIS — I2510 Atherosclerotic heart disease of native coronary artery without angina pectoris: Secondary | ICD-10-CM | POA: Insufficient documentation

## 2012-08-18 DIAGNOSIS — Z951 Presence of aortocoronary bypass graft: Secondary | ICD-10-CM | POA: Insufficient documentation

## 2012-08-18 DIAGNOSIS — I10 Essential (primary) hypertension: Secondary | ICD-10-CM | POA: Insufficient documentation

## 2012-08-21 ENCOUNTER — Ambulatory Visit (INDEPENDENT_AMBULATORY_CARE_PROVIDER_SITE_OTHER): Payer: Medicare Other | Admitting: *Deleted

## 2012-08-21 ENCOUNTER — Encounter (HOSPITAL_COMMUNITY): Payer: Self-pay

## 2012-08-21 DIAGNOSIS — Z950 Presence of cardiac pacemaker: Secondary | ICD-10-CM

## 2012-08-21 DIAGNOSIS — I4891 Unspecified atrial fibrillation: Secondary | ICD-10-CM

## 2012-08-22 LAB — REMOTE PACEMAKER DEVICE
BRDY-0002RV: 60 {beats}/min
BRDY-0004RV: 120 {beats}/min
DEVICE MODEL PM: 2574597
RV LEAD THRESHOLD: 0.75 V

## 2012-08-23 ENCOUNTER — Encounter (HOSPITAL_COMMUNITY): Payer: Self-pay

## 2012-08-23 ENCOUNTER — Other Ambulatory Visit: Payer: Self-pay

## 2012-08-25 ENCOUNTER — Encounter (HOSPITAL_COMMUNITY): Payer: Self-pay

## 2012-08-28 ENCOUNTER — Encounter (HOSPITAL_COMMUNITY): Payer: Self-pay

## 2012-08-30 ENCOUNTER — Encounter (HOSPITAL_COMMUNITY): Payer: Self-pay

## 2012-09-01 ENCOUNTER — Encounter (HOSPITAL_COMMUNITY): Payer: Self-pay

## 2012-09-04 ENCOUNTER — Encounter (HOSPITAL_COMMUNITY): Payer: Self-pay

## 2012-09-06 ENCOUNTER — Encounter (HOSPITAL_COMMUNITY): Payer: Self-pay

## 2012-09-08 ENCOUNTER — Encounter (HOSPITAL_COMMUNITY): Payer: Self-pay

## 2012-09-08 ENCOUNTER — Encounter: Payer: Self-pay | Admitting: *Deleted

## 2012-09-11 ENCOUNTER — Encounter (HOSPITAL_COMMUNITY): Payer: Self-pay

## 2012-09-12 ENCOUNTER — Ambulatory Visit (INDEPENDENT_AMBULATORY_CARE_PROVIDER_SITE_OTHER): Payer: Medicare Other | Admitting: *Deleted

## 2012-09-12 DIAGNOSIS — I4891 Unspecified atrial fibrillation: Secondary | ICD-10-CM

## 2012-09-12 DIAGNOSIS — Z7901 Long term (current) use of anticoagulants: Secondary | ICD-10-CM

## 2012-09-13 ENCOUNTER — Encounter (HOSPITAL_COMMUNITY)
Admission: RE | Admit: 2012-09-13 | Discharge: 2012-09-13 | Disposition: A | Discharge status: Home / Self Care | Payer: Self-pay | Source: Ambulatory Visit | Attending: Internal Medicine | Admitting: Internal Medicine

## 2012-09-15 ENCOUNTER — Encounter (HOSPITAL_COMMUNITY)
Admission: RE | Admit: 2012-09-15 | Discharge: 2012-09-15 | Disposition: A | Discharge status: Home / Self Care | Payer: Self-pay | Source: Ambulatory Visit | Attending: Internal Medicine | Admitting: Internal Medicine

## 2012-09-20 ENCOUNTER — Encounter (HOSPITAL_COMMUNITY)
Admission: RE | Admit: 2012-09-20 | Discharge: 2012-09-20 | Disposition: A | Discharge status: Home / Self Care | Payer: Self-pay | Source: Ambulatory Visit | Attending: Internal Medicine | Admitting: Internal Medicine

## 2012-09-20 DIAGNOSIS — Z8249 Family history of ischemic heart disease and other diseases of the circulatory system: Secondary | ICD-10-CM | POA: Insufficient documentation

## 2012-09-20 DIAGNOSIS — I10 Essential (primary) hypertension: Secondary | ICD-10-CM | POA: Insufficient documentation

## 2012-09-20 DIAGNOSIS — E119 Type 2 diabetes mellitus without complications: Secondary | ICD-10-CM | POA: Insufficient documentation

## 2012-09-20 DIAGNOSIS — Z951 Presence of aortocoronary bypass graft: Secondary | ICD-10-CM | POA: Insufficient documentation

## 2012-09-20 DIAGNOSIS — I2510 Atherosclerotic heart disease of native coronary artery without angina pectoris: Secondary | ICD-10-CM | POA: Insufficient documentation

## 2012-09-20 DIAGNOSIS — E7800 Pure hypercholesterolemia, unspecified: Secondary | ICD-10-CM | POA: Insufficient documentation

## 2012-09-20 DIAGNOSIS — Z5189 Encounter for other specified aftercare: Secondary | ICD-10-CM | POA: Insufficient documentation

## 2012-09-22 ENCOUNTER — Encounter (HOSPITAL_COMMUNITY)
Admission: RE | Admit: 2012-09-22 | Discharge: 2012-09-22 | Disposition: A | Discharge status: Home / Self Care | Payer: Self-pay | Source: Ambulatory Visit | Attending: Internal Medicine | Admitting: Internal Medicine

## 2012-09-25 ENCOUNTER — Encounter (HOSPITAL_COMMUNITY)
Admission: RE | Admit: 2012-09-25 | Discharge: 2012-09-25 | Disposition: A | Discharge status: Home / Self Care | Payer: Self-pay | Source: Ambulatory Visit | Attending: Internal Medicine | Admitting: Internal Medicine

## 2012-09-25 ENCOUNTER — Ambulatory Visit (INDEPENDENT_AMBULATORY_CARE_PROVIDER_SITE_OTHER): Payer: Medicare Other

## 2012-09-25 DIAGNOSIS — I4891 Unspecified atrial fibrillation: Secondary | ICD-10-CM

## 2012-09-25 DIAGNOSIS — Z7901 Long term (current) use of anticoagulants: Secondary | ICD-10-CM

## 2012-09-27 ENCOUNTER — Encounter (HOSPITAL_COMMUNITY)
Admission: RE | Admit: 2012-09-27 | Discharge: 2012-09-27 | Disposition: A | Discharge status: Home / Self Care | Payer: Self-pay | Source: Ambulatory Visit | Attending: Internal Medicine | Admitting: Internal Medicine

## 2012-09-28 ENCOUNTER — Encounter (HOSPITAL_BASED_OUTPATIENT_CLINIC_OR_DEPARTMENT_OTHER): Payer: Medicare Other | Attending: Internal Medicine

## 2012-09-28 DIAGNOSIS — R609 Edema, unspecified: Secondary | ICD-10-CM | POA: Insufficient documentation

## 2012-09-28 DIAGNOSIS — Z79899 Other long term (current) drug therapy: Secondary | ICD-10-CM | POA: Insufficient documentation

## 2012-09-28 DIAGNOSIS — Z85038 Personal history of other malignant neoplasm of large intestine: Secondary | ICD-10-CM | POA: Insufficient documentation

## 2012-09-28 DIAGNOSIS — L989 Disorder of the skin and subcutaneous tissue, unspecified: Secondary | ICD-10-CM | POA: Insufficient documentation

## 2012-09-28 DIAGNOSIS — I509 Heart failure, unspecified: Secondary | ICD-10-CM | POA: Insufficient documentation

## 2012-09-28 DIAGNOSIS — Z950 Presence of cardiac pacemaker: Secondary | ICD-10-CM | POA: Insufficient documentation

## 2012-09-28 DIAGNOSIS — Z7901 Long term (current) use of anticoagulants: Secondary | ICD-10-CM | POA: Insufficient documentation

## 2012-09-28 DIAGNOSIS — I872 Venous insufficiency (chronic) (peripheral): Secondary | ICD-10-CM | POA: Insufficient documentation

## 2012-09-28 DIAGNOSIS — Z9089 Acquired absence of other organs: Secondary | ICD-10-CM | POA: Insufficient documentation

## 2012-09-28 DIAGNOSIS — I87319 Chronic venous hypertension (idiopathic) with ulcer of unspecified lower extremity: Secondary | ICD-10-CM | POA: Insufficient documentation

## 2012-09-28 DIAGNOSIS — L97909 Non-pressure chronic ulcer of unspecified part of unspecified lower leg with unspecified severity: Secondary | ICD-10-CM | POA: Insufficient documentation

## 2012-09-28 LAB — GLUCOSE, CAPILLARY: Glucose-Capillary: 175 mg/dL — ABNORMAL HIGH (ref 70–99)

## 2012-09-28 NOTE — H&P (Signed)
NAME:  Oscar Bruce, Oscar Bruce NO.:  [id number]  MEDICAL RECORD NO.:  [id number]  LOCATION:  FOOT                         FACILITY:  [facility]  PHYSICIAN:  Maxwell Caul, M.D.DATE OF BIRTH:  06/15/1924  DATE OF ADMISSION:  09/28/2012 DATE OF DISCHARGE:                             HISTORY & PHYSICAL   LOCATION:  [facility],  HISTORY OF PRESENT ILLNESS:  [name] is an 79 year old man who, I believe is self-referred.  He is here for our review of lower extremity wounds and edema, and a more chronic skin rash on his back and arms predominantly into his chest.  The patient receives some of his medical care in [place], where he spends part of the year.  He followed at [facility] for his cardiology issues.  I have no real information on this man.  He comes with notes from his Lymphedema Clinic evaluation at [facility], which I believe is in [place].  The left lower extremity edema,  lymphedema. I think this suggestion of a Wound Care Center, was given to them through this office.  The notes are from August 20.  PAST MEDICAL HISTORY:  Nephrolithiasis; cholecystectomy; colon cancer in 1998, status post resection; 2 stents located in his LAD; biventricular pacemaker; congestive heart failure with an EF of 45-50%, tricuspid regurgitation, left ventricular hypertrophy; pulmonary hypertension which is moderate.  Last echo in 2014.  Also listed on his problem list is lymphedema.  REVIEW OF SYSTEMS:  Skin:  The patient tells me for several years, he has had a diffuse rash mostly on his upper back to a lesser extent on his arms, lower back, and thorax.  This starts off as what he describes as a nodular lesion eventually ulcerates, it is not painful or pruritic. He does not believe this starts as a blister.  He has been to see Dermatology in [place].  If there is a diagnosis here, he was unaware of it.  He has had a biopsy, which  according to him was nondiagnostic. The patient is quite convinced that this represents some form of infestation as he has apparently found "bugs" in these lesions.  The second issue is that of lower extremity edema, erythema, and wounds in the legs of more recent onset.  Respiratory:  He is not complaining of shortness of breath.  Cardiac:  No clear chest pain.  MEDICATION LIST: 1. Coumadin. 2. Lasix 40 mg one and half tablets daily. 3. Ramipril 5 daily. 4. Clonidine 0.3 daily. 5. Lanoxin 0.25 daily. 6. Metoprolol 50 mg daily. 7. Clindamycin prior to dental appointment. 8. Glipizide 5 mg twice a day. 9. Atorvastatin 40 mg one-half tablet a day. 10.Ferrous gluconate, one-half tablet a day.  PHYSICAL EXAMINATION:  VITAL SIGNS:  Temperature is 98.2, pulse 64, respirations 18, blood pressure 134/73.  CBG is 175. RESPIRATORY:  Shows clear air entry bilaterally. CARDIAC:  Heart sounds are regular.  There is a 3/6 pansystolic murmur at the lower left sternal border.  No particular variation with the respiratory cycle as JVP is not elevated.  There is no coccyx edema. ABDOMEN:  Nontender. EXTREMITIES:  Below the knees, he has 2 to  3+ pitting edema with what I believe to be chronic erythema of both legs.  There is no convincing evidence of DVT or cellulitis.  His peripheral pulses are palpable at the dorsalis pedis.  His ABIs as calculated in this clinic were 1.2 on the right, 1.3 on the left. WOUND EXAM:  The major area here is a fairly large venous stasis ulcer on the left posterior leg.  There is also an area on the right anterior leg, which is smaller.  There is a degree of erythema on the left posterior leg, but no clear cellulitis. SKIN EXAM:  He has several ulcerated areas across his upper back.  These are nondescript, may reflect scratching some form of chronic eczema. Once again, the patient is convinced that this represents an infestation.  There are multiple healed areas on  his but midback and lower back, anterior shoulders and arms.  IMPRESSION: 1. Venous stasis ulceration in the setting of significant venous     stasis hypertension and insufficiency, the major area here is on     the left posterior leg.  Both of his legs, we dressed these with     silver alginate.  We applied TCA to both legs.  We put him on unna     boots bilaterally with Kerlix and Coban. 2. Wide-spread skin disease, clearly of chronic condition with     multiple open and healed areas on his back.  He has seen     Dermatology here in the past.  Apparently, they prescribed him     something that did not work.  There has also been a biopsy.  I have     asked them to get records on this, although, I currently view this     as quite a separate issue from his lower extremity, predominantly     venous wounds. 3. Congestive heart failure.  There was no evidence of either left or     right ventricular failure at the bed side.  He is followed by Dr.     Ladona Ridgel and at the [facility].  I will see him again next week.  I do not really feel that the lower extremity ulcers will be much of a problem here.  I really do not think this is lymphedema, this is  pitting edema, and chronic changes of severe venous hypertension.          ______________________________ Maxwell Caul, M.D.     MGR/MEDQ  D:  09/28/2012  T:  09/28/2012  Job:  [id number]

## 2012-09-29 ENCOUNTER — Encounter (HOSPITAL_COMMUNITY)
Admission: RE | Admit: 2012-09-29 | Discharge: 2012-09-29 | Disposition: A | Discharge status: Home / Self Care | Payer: Self-pay | Source: Ambulatory Visit | Attending: Internal Medicine | Admitting: Internal Medicine

## 2012-10-02 ENCOUNTER — Encounter (HOSPITAL_COMMUNITY)
Admission: RE | Admit: 2012-10-02 | Discharge: 2012-10-02 | Disposition: A | Discharge status: Home / Self Care | Payer: Self-pay | Source: Ambulatory Visit | Attending: Internal Medicine | Admitting: Internal Medicine

## 2012-10-04 ENCOUNTER — Encounter (HOSPITAL_COMMUNITY)
Admission: RE | Admit: 2012-10-04 | Discharge: 2012-10-04 | Disposition: A | Discharge status: Home / Self Care | Payer: Self-pay | Source: Ambulatory Visit | Attending: Internal Medicine | Admitting: Internal Medicine

## 2012-10-06 ENCOUNTER — Encounter (HOSPITAL_COMMUNITY)
Admission: RE | Admit: 2012-10-06 | Discharge: 2012-10-06 | Disposition: A | Discharge status: Home / Self Care | Payer: Self-pay | Source: Ambulatory Visit | Attending: Internal Medicine | Admitting: Internal Medicine

## 2012-10-09 ENCOUNTER — Encounter (HOSPITAL_COMMUNITY)
Admission: RE | Admit: 2012-10-09 | Discharge: 2012-10-09 | Disposition: A | Discharge status: Home / Self Care | Payer: Self-pay | Source: Ambulatory Visit | Attending: Internal Medicine | Admitting: Internal Medicine

## 2012-10-10 ENCOUNTER — Encounter: Payer: Self-pay | Admitting: Internal Medicine

## 2012-10-11 ENCOUNTER — Encounter (HOSPITAL_COMMUNITY)
Admission: RE | Admit: 2012-10-11 | Discharge: 2012-10-11 | Disposition: A | Discharge status: Home / Self Care | Payer: Self-pay | Source: Ambulatory Visit | Attending: Internal Medicine | Admitting: Internal Medicine

## 2012-10-13 ENCOUNTER — Encounter (HOSPITAL_COMMUNITY)
Admission: RE | Admit: 2012-10-13 | Discharge: 2012-10-13 | Disposition: A | Discharge status: Home / Self Care | Payer: Self-pay | Source: Ambulatory Visit | Attending: Internal Medicine | Admitting: Internal Medicine

## 2012-10-16 ENCOUNTER — Encounter (HOSPITAL_COMMUNITY)
Admission: RE | Admit: 2012-10-16 | Discharge: 2012-10-16 | Disposition: A | Discharge status: Home / Self Care | Payer: Self-pay | Source: Ambulatory Visit | Attending: Internal Medicine | Admitting: Internal Medicine

## 2012-10-18 ENCOUNTER — Encounter (HOSPITAL_COMMUNITY): Payer: Medicare Other

## 2012-10-18 DIAGNOSIS — I10 Essential (primary) hypertension: Secondary | ICD-10-CM | POA: Insufficient documentation

## 2012-10-18 DIAGNOSIS — E7800 Pure hypercholesterolemia, unspecified: Secondary | ICD-10-CM | POA: Insufficient documentation

## 2012-10-18 DIAGNOSIS — Z5189 Encounter for other specified aftercare: Secondary | ICD-10-CM | POA: Insufficient documentation

## 2012-10-18 DIAGNOSIS — I2510 Atherosclerotic heart disease of native coronary artery without angina pectoris: Secondary | ICD-10-CM | POA: Insufficient documentation

## 2012-10-18 DIAGNOSIS — Z8249 Family history of ischemic heart disease and other diseases of the circulatory system: Secondary | ICD-10-CM | POA: Insufficient documentation

## 2012-10-18 DIAGNOSIS — Z951 Presence of aortocoronary bypass graft: Secondary | ICD-10-CM | POA: Insufficient documentation

## 2012-10-18 DIAGNOSIS — E119 Type 2 diabetes mellitus without complications: Secondary | ICD-10-CM | POA: Insufficient documentation

## 2012-10-19 ENCOUNTER — Encounter (HOSPITAL_BASED_OUTPATIENT_CLINIC_OR_DEPARTMENT_OTHER): Payer: Medicare Other | Attending: Internal Medicine

## 2012-10-19 DIAGNOSIS — I872 Venous insufficiency (chronic) (peripheral): Secondary | ICD-10-CM | POA: Insufficient documentation

## 2012-10-19 DIAGNOSIS — L97809 Non-pressure chronic ulcer of other part of unspecified lower leg with unspecified severity: Secondary | ICD-10-CM | POA: Insufficient documentation

## 2012-10-19 DIAGNOSIS — I890 Lymphedema, not elsewhere classified: Secondary | ICD-10-CM | POA: Insufficient documentation

## 2012-10-20 ENCOUNTER — Encounter (HOSPITAL_COMMUNITY)
Admission: RE | Admit: 2012-10-20 | Discharge: 2012-10-20 | Disposition: A | Discharge status: Home / Self Care | Payer: Self-pay | Source: Ambulatory Visit | Attending: Internal Medicine | Admitting: Internal Medicine

## 2012-10-23 ENCOUNTER — Ambulatory Visit (INDEPENDENT_AMBULATORY_CARE_PROVIDER_SITE_OTHER): Payer: Medicare Other | Admitting: *Deleted

## 2012-10-23 ENCOUNTER — Encounter (HOSPITAL_COMMUNITY)
Admission: RE | Admit: 2012-10-23 | Discharge: 2012-10-23 | Disposition: A | Discharge status: Home / Self Care | Payer: Self-pay | Source: Ambulatory Visit | Attending: Internal Medicine | Admitting: Internal Medicine

## 2012-10-23 DIAGNOSIS — Z7901 Long term (current) use of anticoagulants: Secondary | ICD-10-CM

## 2012-10-23 DIAGNOSIS — I4891 Unspecified atrial fibrillation: Secondary | ICD-10-CM

## 2012-10-23 LAB — POCT INR: INR: 2.4

## 2012-10-25 ENCOUNTER — Encounter (HOSPITAL_COMMUNITY)
Admission: RE | Admit: 2012-10-25 | Discharge: 2012-10-25 | Disposition: A | Discharge status: Home / Self Care | Payer: Self-pay | Source: Ambulatory Visit | Attending: Internal Medicine | Admitting: Internal Medicine

## 2012-10-27 ENCOUNTER — Encounter (HOSPITAL_COMMUNITY): Payer: Medicare Other

## 2012-10-30 ENCOUNTER — Encounter (HOSPITAL_COMMUNITY): Payer: Medicare Other

## 2012-11-01 ENCOUNTER — Encounter (HOSPITAL_COMMUNITY): Payer: Medicare Other

## 2012-11-03 ENCOUNTER — Encounter (HOSPITAL_COMMUNITY): Payer: Medicare Other

## 2012-11-06 ENCOUNTER — Encounter (HOSPITAL_COMMUNITY): Payer: Medicare Other

## 2012-11-08 ENCOUNTER — Encounter (HOSPITAL_COMMUNITY): Payer: Medicare Other

## 2012-11-10 ENCOUNTER — Encounter (HOSPITAL_COMMUNITY): Payer: Medicare Other

## 2012-11-13 ENCOUNTER — Encounter (HOSPITAL_COMMUNITY): Payer: Medicare Other

## 2012-11-15 ENCOUNTER — Encounter (HOSPITAL_COMMUNITY): Payer: Medicare Other

## 2012-11-17 ENCOUNTER — Encounter (HOSPITAL_COMMUNITY): Payer: Medicare Other

## 2012-11-20 ENCOUNTER — Encounter (HOSPITAL_COMMUNITY): Payer: Self-pay

## 2012-11-20 ENCOUNTER — Encounter: Payer: Medicare Other | Admitting: *Deleted

## 2012-11-20 DIAGNOSIS — Z951 Presence of aortocoronary bypass graft: Secondary | ICD-10-CM | POA: Insufficient documentation

## 2012-11-20 DIAGNOSIS — Z5189 Encounter for other specified aftercare: Secondary | ICD-10-CM | POA: Insufficient documentation

## 2012-11-20 DIAGNOSIS — I2510 Atherosclerotic heart disease of native coronary artery without angina pectoris: Secondary | ICD-10-CM | POA: Insufficient documentation

## 2012-11-22 ENCOUNTER — Encounter: Payer: Self-pay | Admitting: *Deleted

## 2012-11-22 ENCOUNTER — Encounter (HOSPITAL_COMMUNITY): Payer: Medicare Other

## 2012-11-23 ENCOUNTER — Other Ambulatory Visit: Payer: Self-pay

## 2012-11-24 ENCOUNTER — Encounter (HOSPITAL_COMMUNITY): Payer: Medicare Other

## 2012-11-27 ENCOUNTER — Encounter: Payer: Self-pay | Admitting: Internal Medicine

## 2012-11-27 ENCOUNTER — Encounter (HOSPITAL_COMMUNITY): Payer: Medicare Other

## 2012-11-27 DIAGNOSIS — Z950 Presence of cardiac pacemaker: Secondary | ICD-10-CM

## 2012-11-27 DIAGNOSIS — I4891 Unspecified atrial fibrillation: Secondary | ICD-10-CM

## 2012-11-27 LAB — MDC_IDC_ENUM_SESS_TYPE_REMOTE
Battery Voltage: 2.99 V
Date Time Interrogation Session: 20141110181917
Lead Channel Pacing Threshold Amplitude: 0.625 V
Lead Channel Sensing Intrinsic Amplitude: 10.8 mV
Lead Channel Setting Pacing Amplitude: 0.875
Lead Channel Setting Pacing Pulse Width: 0.4 ms
Lead Channel Setting Sensing Sensitivity: 4 mV

## 2012-11-28 ENCOUNTER — Ambulatory Visit (INDEPENDENT_AMBULATORY_CARE_PROVIDER_SITE_OTHER): Payer: Medicare Other | Admitting: *Deleted

## 2012-11-28 ENCOUNTER — Other Ambulatory Visit: Payer: Self-pay

## 2012-11-28 DIAGNOSIS — Z7901 Long term (current) use of anticoagulants: Secondary | ICD-10-CM

## 2012-11-28 DIAGNOSIS — I4891 Unspecified atrial fibrillation: Secondary | ICD-10-CM

## 2012-11-28 MED ORDER — CLONIDINE HCL 0.2 MG PO TABS: 0.4000 mg | ORAL_TABLET | Freq: Three times a day (TID) | ORAL | Status: DC

## 2012-11-29 ENCOUNTER — Telehealth: Payer: Self-pay | Admitting: Internal Medicine

## 2012-11-29 ENCOUNTER — Encounter (HOSPITAL_COMMUNITY): Payer: Medicare Other

## 2012-11-29 NOTE — Telephone Encounter (Signed)
New message     Got letter saying he did not transmit on 11-3.  He was out of town and transmitted yesterday.   Did you get a report?

## 2012-11-29 NOTE — Telephone Encounter (Signed)
Transmission was received. LMOM in regards to transmission.

## 2012-12-01 ENCOUNTER — Encounter (HOSPITAL_COMMUNITY): Payer: Medicare Other

## 2012-12-01 ENCOUNTER — Other Ambulatory Visit: Payer: Self-pay | Admitting: Internal Medicine

## 2012-12-04 ENCOUNTER — Encounter (HOSPITAL_COMMUNITY)
Admission: RE | Admit: 2012-12-04 | Discharge: 2012-12-04 | Disposition: A | Discharge status: Home / Self Care | Payer: Self-pay | Source: Ambulatory Visit | Attending: Internal Medicine | Admitting: Internal Medicine

## 2012-12-05 ENCOUNTER — Encounter: Payer: Self-pay | Admitting: *Deleted

## 2012-12-06 ENCOUNTER — Encounter (HOSPITAL_COMMUNITY)
Admission: RE | Admit: 2012-12-06 | Discharge: 2012-12-06 | Disposition: A | Discharge status: Home / Self Care | Payer: Self-pay | Source: Ambulatory Visit | Attending: Internal Medicine | Admitting: Internal Medicine

## 2012-12-08 ENCOUNTER — Encounter (HOSPITAL_COMMUNITY)
Admission: RE | Admit: 2012-12-08 | Discharge: 2012-12-08 | Disposition: A | Discharge status: Home / Self Care | Payer: Self-pay | Source: Ambulatory Visit | Attending: Internal Medicine | Admitting: Internal Medicine

## 2012-12-11 ENCOUNTER — Encounter (HOSPITAL_COMMUNITY)
Admission: RE | Admit: 2012-12-11 | Discharge: 2012-12-11 | Disposition: A | Discharge status: Home / Self Care | Payer: Self-pay | Source: Ambulatory Visit | Attending: Internal Medicine | Admitting: Internal Medicine

## 2012-12-13 ENCOUNTER — Encounter (HOSPITAL_COMMUNITY)
Admission: RE | Admit: 2012-12-13 | Discharge: 2012-12-13 | Disposition: A | Discharge status: Home / Self Care | Payer: Self-pay | Source: Ambulatory Visit | Attending: Internal Medicine | Admitting: Internal Medicine

## 2012-12-18 ENCOUNTER — Encounter (HOSPITAL_COMMUNITY)
Admission: RE | Admit: 2012-12-18 | Discharge: 2012-12-18 | Disposition: A | Discharge status: Home / Self Care | Payer: Self-pay | Source: Ambulatory Visit | Attending: Internal Medicine | Admitting: Internal Medicine

## 2012-12-18 DIAGNOSIS — Z951 Presence of aortocoronary bypass graft: Secondary | ICD-10-CM | POA: Insufficient documentation

## 2012-12-18 DIAGNOSIS — I2510 Atherosclerotic heart disease of native coronary artery without angina pectoris: Secondary | ICD-10-CM | POA: Insufficient documentation

## 2012-12-18 DIAGNOSIS — Z5189 Encounter for other specified aftercare: Secondary | ICD-10-CM | POA: Insufficient documentation

## 2012-12-20 ENCOUNTER — Emergency Department (INDEPENDENT_AMBULATORY_CARE_PROVIDER_SITE_OTHER): Payer: Medicare Other

## 2012-12-20 ENCOUNTER — Encounter (HOSPITAL_COMMUNITY)
Admission: RE | Admit: 2012-12-20 | Discharge: 2012-12-20 | Disposition: A | Discharge status: Home / Self Care | Payer: Self-pay | Source: Ambulatory Visit | Attending: Internal Medicine | Admitting: Internal Medicine

## 2012-12-20 ENCOUNTER — Emergency Department (HOSPITAL_COMMUNITY)
Admission: EM | Admit: 2012-12-20 | Discharge: 2012-12-20 | Disposition: A | Discharge status: Home / Self Care | Payer: Medicare Other | Source: Home / Self Care | Attending: Family Medicine | Admitting: Family Medicine

## 2012-12-20 ENCOUNTER — Encounter (HOSPITAL_COMMUNITY): Payer: Self-pay | Admitting: Emergency Medicine

## 2012-12-20 DIAGNOSIS — S6390XA Sprain of unspecified part of unspecified wrist and hand, initial encounter: Secondary | ICD-10-CM

## 2012-12-20 DIAGNOSIS — S63619A Unspecified sprain of unspecified finger, initial encounter: Secondary | ICD-10-CM

## 2012-12-20 NOTE — ED Provider Notes (Signed)
CSN: [id number]     Arrival date & time 12/20/12  1446 History   First MD Initiated Contact with Patient 12/20/12 1612     Chief Complaint  Patient presents with  . Finger Injury   HPI 79 year old male who presents with left finger pain. He fell yesterday with his fingers extended. He had some associated pain but no swelling at that time. This morning he woke up with swelling of the ring finger. He took 2 Excedrin. No ice. He had some difficulty fully flexing his finger but states that since this morning pain, swelling and range of motion have all improved. Pain is worse with movement, better with rest. Past Medical History  Diagnosis Date  . A-fib   . Hypertension   . CAD (coronary artery disease)   . Diabetes mellitus    Past Surgical History  Procedure Laterality Date  . Cholecystectomy    . Coronary artery bypass graft    . Pacemaker insertion    . Cardiac catheterization  2001, 2004  . Cataract surgery  2000   Family History  Problem Relation Age of Onset  . Diabetes Mother   . Coronary artery disease Father   . Diabetes Sister    History  Substance Use Topics  . Smoking status: Never Smoker   . Smokeless tobacco: Not on file  . Alcohol Use: No    Review of Systems Notated except per history of present illness Allergies  Penicillins  Home Medications   Current Outpatient Rx  Name  Route  Sig  Dispense  Refill  . acetaminophen (TYLENOL) 500 MG chewable tablet   Oral   Chew 500 mg by mouth every 6 (six) hours as needed. For pain         . atorvastatin (LIPITOR) 40 MG tablet   Oral   Take 20 mg by mouth.          . B-D UF III MINI PEN NEEDLES 31G X 5 MM MISC               . Calcium Carbonate Antacid (MAALOX) 600 MG chewable tablet   Oral   Chew 600 mg by mouth as needed.         . cephALEXin (KEFLEX) 500 MG capsule   Oral   Take 1 capsule (500 mg total) by mouth 3 (three) times daily.   63 capsule   0   . clindamycin (CLEOCIN) 150 MG  capsule   Oral   Take 150 mg by mouth 4 (four) times daily. Prior to dental work         . cloNIDine (CATAPRES) 0.2 MG tablet   Oral   Take 2 tablets (0.4 mg total) by mouth 3 (three) times daily.   180 tablet   3   . cyanocobalamin 100 MCG tablet   Oral   Take 100 mcg by mouth daily.         . digoxin (LANOXIN) 0.25 MG tablet   Oral   Take 0.125 mcg by mouth daily. One half tablet daily         . Exenatide (BYETTA 5 MCG PEN [place])   Subcutaneous   Inject 5 mg into the skin 2 (two) times daily.          . ferrous sulfate 325 (65 FE) MG EC tablet   Oral   Take 325 mg by mouth daily. 1/2 tablet daily 27 mg         . furosemide (LASIX)  40 MG tablet      Take 1 1/2 tablets by mouth daily   45 tablet   11   . glipiZIDE (GLUCOTROL) 5 MG tablet   Oral   Take 5 mg by mouth 2 (two) times daily before a meal.           . metoprolol (TOPROL-XL) 50 MG 24 hr tablet   Oral   Take 50 mg by mouth daily.           . nitroGLYCERIN (NITROSTAT) 0.4 MG SL tablet   Sublingual   Place 0.4 mg under the tongue every 5 (five) minutes as needed.           . ramipril (ALTACE) 5 MG capsule   Oral   Take 5 mg by mouth daily.           Marland Kitchen warfarin (COUMADIN) 5 MG tablet   Oral   Take 2.5-5 mg by mouth daily. Takes 5mg  daily except on tue and thur takes 2.5mg           BP 144/74  Pulse 74  Temp(Src) 98.3 F (36.8 C) (Oral)  Resp 18  SpO2 97% Physical Exam General: Very pleasant elderly gentleman, in no acute distress, alert and oriented x3 Hand: Left: Normal range of motion of wrist, no tenderness along the anatomical snuff box, no tenderness along bones of the hand. Tenderness and swelling along proximal phalange of the left 4th finger. Able to flex PIP to 90 degrees.   Arthritic changes in other fingers.  ED Course  Procedures (including critical care time) Labs Review Labs Reviewed - No data to display Imaging Review Dg Finger Ring Left  12/20/2012   CLINICAL  DATA:  Larey Seat in bath tub with pain and swelling  EXAM: LEFT RING FINGER 2+V  COMPARISON:  None.  FINDINGS: No acute fracture is seen. There is degenerative joint disease of all rib primary of the DIP joint of the left 4th finger as well as the PIP joint. However no erosion is seen.  IMPRESSION: Degenerative change primarily involving the DIP joint. No fracture or dislocation is seen.   Electronically Signed   By: Dwyane Dee M.D.   On: 12/20/2012 17:10     MDM   1. Finger sprain, initial encounter    No fracture on xray. Likely bruising and sprain of finger. Patient had finger splinted in office. Symptomatic treatment with tylenol or ibuprofen (sparingly given age and h/o CAD) and ice.  Follow up if not improved.   Marena Chancy, PGY-3 Family Medicine Resident     Lonia Skinner, MD 12/20/12 [phone]

## 2012-12-20 NOTE — ED Notes (Signed)
Pt c/o left finger inj (4th digit) onset yest night Reports he fell/slipped and jammed finger when he landed on the floor Sxs include: swelling and pain Denies: Head inj/LOC Alert w/no signs of acute distress.

## 2012-12-21 NOTE — ED Provider Notes (Signed)
Medical screening examination/treatment/procedure(s) were performed by a resident physician or non-physician practitioner and as the supervising physician I was immediately available for consultation/collaboration.  Clementeen Graham, MD    Rodolph Bong, MD 12/21/12 [phone]

## 2012-12-22 ENCOUNTER — Encounter (HOSPITAL_COMMUNITY)
Admission: RE | Admit: 2012-12-22 | Discharge: 2012-12-22 | Disposition: A | Discharge status: Home / Self Care | Payer: Self-pay | Source: Ambulatory Visit | Attending: Internal Medicine | Admitting: Internal Medicine

## 2012-12-25 ENCOUNTER — Encounter (HOSPITAL_COMMUNITY)
Admission: RE | Admit: 2012-12-25 | Discharge: 2012-12-25 | Disposition: A | Discharge status: Home / Self Care | Payer: Self-pay | Source: Ambulatory Visit | Attending: Internal Medicine | Admitting: Internal Medicine

## 2012-12-26 ENCOUNTER — Encounter: Payer: Self-pay | Admitting: Internal Medicine

## 2012-12-27 ENCOUNTER — Encounter (HOSPITAL_COMMUNITY)
Admission: RE | Admit: 2012-12-27 | Discharge: 2012-12-27 | Disposition: A | Discharge status: Home / Self Care | Payer: Self-pay | Source: Ambulatory Visit | Attending: Internal Medicine | Admitting: Internal Medicine

## 2012-12-29 ENCOUNTER — Encounter (HOSPITAL_COMMUNITY)
Admission: RE | Admit: 2012-12-29 | Discharge: 2012-12-29 | Disposition: A | Discharge status: Home / Self Care | Payer: Self-pay | Source: Ambulatory Visit | Attending: Internal Medicine | Admitting: Internal Medicine

## 2013-01-01 ENCOUNTER — Encounter (HOSPITAL_COMMUNITY)
Admission: RE | Admit: 2013-01-01 | Discharge: 2013-01-01 | Disposition: A | Discharge status: Home / Self Care | Payer: Self-pay | Source: Ambulatory Visit | Attending: Internal Medicine | Admitting: Internal Medicine

## 2013-01-02 ENCOUNTER — Ambulatory Visit (INDEPENDENT_AMBULATORY_CARE_PROVIDER_SITE_OTHER): Payer: Medicare Other | Admitting: *Deleted

## 2013-01-02 DIAGNOSIS — I4891 Unspecified atrial fibrillation: Secondary | ICD-10-CM

## 2013-01-02 DIAGNOSIS — Z7901 Long term (current) use of anticoagulants: Secondary | ICD-10-CM

## 2013-01-02 LAB — POCT INR: INR: 2.9

## 2013-01-03 ENCOUNTER — Encounter (HOSPITAL_COMMUNITY)
Admission: RE | Admit: 2013-01-03 | Discharge: 2013-01-03 | Disposition: A | Discharge status: Home / Self Care | Payer: Self-pay | Source: Ambulatory Visit | Attending: Internal Medicine | Admitting: Internal Medicine

## 2013-01-05 ENCOUNTER — Encounter (HOSPITAL_COMMUNITY)
Admission: RE | Admit: 2013-01-05 | Discharge: 2013-01-05 | Disposition: A | Discharge status: Home / Self Care | Payer: Self-pay | Source: Ambulatory Visit | Attending: Internal Medicine | Admitting: Internal Medicine

## 2013-01-08 ENCOUNTER — Encounter (HOSPITAL_COMMUNITY): Payer: Medicare Other

## 2013-01-10 ENCOUNTER — Encounter (HOSPITAL_COMMUNITY): Payer: Medicare Other

## 2013-01-15 ENCOUNTER — Encounter (HOSPITAL_COMMUNITY): Payer: Medicare Other

## 2013-01-17 ENCOUNTER — Encounter (HOSPITAL_COMMUNITY): Payer: Medicare Other

## 2013-01-19 ENCOUNTER — Encounter (HOSPITAL_COMMUNITY): Payer: Medicare Other

## 2013-01-22 ENCOUNTER — Encounter (HOSPITAL_COMMUNITY)
Admission: RE | Admit: 2013-01-22 | Discharge: 2013-01-22 | Disposition: A | Discharge status: Home / Self Care | Payer: Self-pay | Source: Ambulatory Visit | Attending: Internal Medicine | Admitting: Internal Medicine

## 2013-01-22 DIAGNOSIS — Z951 Presence of aortocoronary bypass graft: Secondary | ICD-10-CM | POA: Insufficient documentation

## 2013-01-22 DIAGNOSIS — Z5189 Encounter for other specified aftercare: Secondary | ICD-10-CM | POA: Insufficient documentation

## 2013-01-22 DIAGNOSIS — I2510 Atherosclerotic heart disease of native coronary artery without angina pectoris: Secondary | ICD-10-CM | POA: Insufficient documentation

## 2013-01-24 ENCOUNTER — Encounter (HOSPITAL_COMMUNITY)
Admission: RE | Admit: 2013-01-24 | Discharge: 2013-01-24 | Disposition: A | Discharge status: Home / Self Care | Payer: Self-pay | Source: Ambulatory Visit | Attending: Internal Medicine | Admitting: Internal Medicine

## 2013-01-26 ENCOUNTER — Encounter (HOSPITAL_COMMUNITY)
Admission: RE | Admit: 2013-01-26 | Discharge: 2013-01-26 | Disposition: A | Discharge status: Home / Self Care | Payer: Self-pay | Source: Ambulatory Visit | Attending: Internal Medicine | Admitting: Internal Medicine

## 2013-01-29 ENCOUNTER — Encounter (HOSPITAL_COMMUNITY)
Admission: RE | Admit: 2013-01-29 | Discharge: 2013-01-29 | Disposition: A | Discharge status: Home / Self Care | Payer: Self-pay | Source: Ambulatory Visit | Attending: Internal Medicine | Admitting: Internal Medicine

## 2013-01-31 ENCOUNTER — Encounter (HOSPITAL_COMMUNITY)
Admission: RE | Admit: 2013-01-31 | Discharge: 2013-01-31 | Disposition: A | Discharge status: Home / Self Care | Payer: Self-pay | Source: Ambulatory Visit | Attending: Internal Medicine | Admitting: Internal Medicine

## 2013-02-02 ENCOUNTER — Encounter (HOSPITAL_COMMUNITY)
Admission: RE | Admit: 2013-02-02 | Discharge: 2013-02-02 | Disposition: A | Discharge status: Home / Self Care | Payer: Self-pay | Source: Ambulatory Visit | Attending: Internal Medicine | Admitting: Internal Medicine

## 2013-02-05 ENCOUNTER — Encounter (HOSPITAL_COMMUNITY)
Admission: RE | Admit: 2013-02-05 | Discharge: 2013-02-05 | Disposition: A | Discharge status: Home / Self Care | Payer: Self-pay | Source: Ambulatory Visit | Attending: Internal Medicine | Admitting: Internal Medicine

## 2013-02-07 ENCOUNTER — Encounter (HOSPITAL_COMMUNITY)
Admission: RE | Admit: 2013-02-07 | Discharge: 2013-02-07 | Disposition: A | Discharge status: Home / Self Care | Payer: Self-pay | Source: Ambulatory Visit | Attending: Internal Medicine | Admitting: Internal Medicine

## 2013-02-09 ENCOUNTER — Encounter (HOSPITAL_COMMUNITY)
Admission: RE | Admit: 2013-02-09 | Discharge: 2013-02-09 | Disposition: A | Discharge status: Home / Self Care | Payer: Self-pay | Source: Ambulatory Visit | Attending: Internal Medicine | Admitting: Internal Medicine

## 2013-02-12 ENCOUNTER — Encounter (HOSPITAL_COMMUNITY)
Admission: RE | Admit: 2013-02-12 | Discharge: 2013-02-12 | Disposition: A | Discharge status: Home / Self Care | Payer: Self-pay | Source: Ambulatory Visit | Attending: Internal Medicine | Admitting: Internal Medicine

## 2013-02-13 ENCOUNTER — Other Ambulatory Visit: Payer: Self-pay | Admitting: *Deleted

## 2013-02-13 ENCOUNTER — Ambulatory Visit (INDEPENDENT_AMBULATORY_CARE_PROVIDER_SITE_OTHER): Payer: Medicare Other | Admitting: *Deleted

## 2013-02-13 DIAGNOSIS — Z5181 Encounter for therapeutic drug level monitoring: Secondary | ICD-10-CM | POA: Insufficient documentation

## 2013-02-13 DIAGNOSIS — I4891 Unspecified atrial fibrillation: Secondary | ICD-10-CM

## 2013-02-13 DIAGNOSIS — Z7901 Long term (current) use of anticoagulants: Secondary | ICD-10-CM

## 2013-02-13 LAB — POCT INR: INR: 3.5

## 2013-02-14 ENCOUNTER — Encounter (HOSPITAL_COMMUNITY)
Admission: RE | Admit: 2013-02-14 | Discharge: 2013-02-14 | Disposition: A | Discharge status: Home / Self Care | Payer: Self-pay | Source: Ambulatory Visit | Attending: Internal Medicine | Admitting: Internal Medicine

## 2013-02-16 ENCOUNTER — Encounter (HOSPITAL_COMMUNITY)
Admission: RE | Admit: 2013-02-16 | Discharge: 2013-02-16 | Disposition: A | Discharge status: Home / Self Care | Payer: Self-pay | Source: Ambulatory Visit | Attending: Internal Medicine | Admitting: Internal Medicine

## 2013-02-19 ENCOUNTER — Encounter (HOSPITAL_COMMUNITY)
Admission: RE | Admit: 2013-02-19 | Discharge: 2013-02-19 | Disposition: A | Discharge status: Home / Self Care | Payer: Self-pay | Source: Ambulatory Visit | Attending: Internal Medicine | Admitting: Internal Medicine

## 2013-02-19 DIAGNOSIS — I2510 Atherosclerotic heart disease of native coronary artery without angina pectoris: Secondary | ICD-10-CM | POA: Insufficient documentation

## 2013-02-19 DIAGNOSIS — Z5189 Encounter for other specified aftercare: Secondary | ICD-10-CM | POA: Insufficient documentation

## 2013-02-19 DIAGNOSIS — Z951 Presence of aortocoronary bypass graft: Secondary | ICD-10-CM | POA: Insufficient documentation

## 2013-02-21 ENCOUNTER — Encounter (HOSPITAL_COMMUNITY)
Admission: RE | Admit: 2013-02-21 | Discharge: 2013-02-21 | Disposition: A | Discharge status: Home / Self Care | Payer: Self-pay | Source: Ambulatory Visit | Attending: Internal Medicine | Admitting: Internal Medicine

## 2013-02-23 ENCOUNTER — Encounter (HOSPITAL_COMMUNITY)
Admission: RE | Admit: 2013-02-23 | Discharge: 2013-02-23 | Disposition: A | Discharge status: Home / Self Care | Payer: Medicare Other | Source: Ambulatory Visit | Attending: Internal Medicine | Admitting: Internal Medicine

## 2013-02-26 ENCOUNTER — Encounter (HOSPITAL_COMMUNITY)
Admission: RE | Admit: 2013-02-26 | Discharge: 2013-02-26 | Disposition: A | Discharge status: Home / Self Care | Payer: Self-pay | Source: Ambulatory Visit | Attending: Internal Medicine | Admitting: Internal Medicine

## 2013-02-27 ENCOUNTER — Ambulatory Visit (INDEPENDENT_AMBULATORY_CARE_PROVIDER_SITE_OTHER): Payer: Medicare Other | Admitting: *Deleted

## 2013-02-27 DIAGNOSIS — I4891 Unspecified atrial fibrillation: Secondary | ICD-10-CM

## 2013-02-28 ENCOUNTER — Encounter (HOSPITAL_COMMUNITY)
Admission: RE | Admit: 2013-02-28 | Discharge: 2013-02-28 | Disposition: A | Discharge status: Home / Self Care | Payer: Self-pay | Source: Ambulatory Visit | Attending: Internal Medicine | Admitting: Internal Medicine

## 2013-03-02 ENCOUNTER — Encounter (HOSPITAL_COMMUNITY): Payer: Medicare Other

## 2013-03-05 ENCOUNTER — Encounter (HOSPITAL_COMMUNITY)
Admission: RE | Admit: 2013-03-05 | Discharge: 2013-03-05 | Disposition: A | Discharge status: Home / Self Care | Payer: Self-pay | Source: Ambulatory Visit | Attending: Internal Medicine | Admitting: Internal Medicine

## 2013-03-07 ENCOUNTER — Encounter (HOSPITAL_COMMUNITY): Payer: Medicare Other

## 2013-03-07 LAB — MDC_IDC_ENUM_SESS_TYPE_REMOTE
Implantable Pulse Generator Model: 1110
Lead Channel Impedance Value: 540 Ohm
Lead Channel Pacing Threshold Amplitude: 0.625 V
Lead Channel Sensing Intrinsic Amplitude: 10.1 mV
Lead Channel Setting Pacing Pulse Width: 0.4 ms
MDC IDC MSMT LEADCHNL RV PACING THRESHOLD PULSEWIDTH: 0.4 ms
MDC IDC PG SERIAL: 2574597
MDC IDC SET LEADCHNL RV PACING AMPLITUDE: 0.875
MDC IDC SET LEADCHNL RV SENSING SENSITIVITY: 4 mV
MDC IDC STAT BRADY RV PERCENT PACED: 99 %

## 2013-03-08 ENCOUNTER — Ambulatory Visit (INDEPENDENT_AMBULATORY_CARE_PROVIDER_SITE_OTHER): Payer: Medicare Other | Admitting: *Deleted

## 2013-03-08 DIAGNOSIS — Z7901 Long term (current) use of anticoagulants: Secondary | ICD-10-CM

## 2013-03-08 DIAGNOSIS — I4891 Unspecified atrial fibrillation: Secondary | ICD-10-CM

## 2013-03-08 DIAGNOSIS — Z5181 Encounter for therapeutic drug level monitoring: Secondary | ICD-10-CM

## 2013-03-08 LAB — POCT INR: INR: 2.9

## 2013-03-09 ENCOUNTER — Encounter (HOSPITAL_COMMUNITY)
Admission: RE | Admit: 2013-03-09 | Discharge: 2013-03-09 | Disposition: A | Discharge status: Home / Self Care | Payer: Self-pay | Source: Ambulatory Visit | Attending: Internal Medicine | Admitting: Internal Medicine

## 2013-03-12 ENCOUNTER — Encounter (HOSPITAL_COMMUNITY)
Admission: RE | Admit: 2013-03-12 | Discharge: 2013-03-12 | Disposition: A | Discharge status: Home / Self Care | Payer: Self-pay | Source: Ambulatory Visit | Attending: Internal Medicine | Admitting: Internal Medicine

## 2013-03-14 ENCOUNTER — Encounter (HOSPITAL_COMMUNITY)
Admission: RE | Admit: 2013-03-14 | Discharge: 2013-03-14 | Disposition: A | Discharge status: Home / Self Care | Payer: Self-pay | Source: Ambulatory Visit | Attending: Internal Medicine | Admitting: Internal Medicine

## 2013-03-14 ENCOUNTER — Encounter: Payer: Self-pay | Admitting: *Deleted

## 2013-03-16 ENCOUNTER — Encounter (HOSPITAL_COMMUNITY)
Admission: RE | Admit: 2013-03-16 | Discharge: 2013-03-16 | Disposition: A | Discharge status: Home / Self Care | Payer: Self-pay | Source: Ambulatory Visit | Attending: Internal Medicine | Admitting: Internal Medicine

## 2013-03-19 ENCOUNTER — Encounter (HOSPITAL_COMMUNITY)
Admission: RE | Admit: 2013-03-19 | Discharge: 2013-03-19 | Disposition: A | Discharge status: Home / Self Care | Payer: Self-pay | Source: Ambulatory Visit | Attending: Internal Medicine | Admitting: Internal Medicine

## 2013-03-19 DIAGNOSIS — Z5189 Encounter for other specified aftercare: Secondary | ICD-10-CM | POA: Insufficient documentation

## 2013-03-19 DIAGNOSIS — Z951 Presence of aortocoronary bypass graft: Secondary | ICD-10-CM | POA: Insufficient documentation

## 2013-03-19 DIAGNOSIS — I2510 Atherosclerotic heart disease of native coronary artery without angina pectoris: Secondary | ICD-10-CM | POA: Insufficient documentation

## 2013-03-21 ENCOUNTER — Encounter (HOSPITAL_COMMUNITY)
Admission: RE | Admit: 2013-03-21 | Discharge: 2013-03-21 | Disposition: A | Discharge status: Home / Self Care | Payer: Self-pay | Source: Ambulatory Visit | Attending: Internal Medicine | Admitting: Internal Medicine

## 2013-03-23 ENCOUNTER — Other Ambulatory Visit: Payer: Self-pay | Admitting: Internal Medicine

## 2013-03-23 ENCOUNTER — Encounter (HOSPITAL_COMMUNITY)
Admission: RE | Admit: 2013-03-23 | Discharge: 2013-03-23 | Disposition: A | Discharge status: Home / Self Care | Payer: Self-pay | Source: Ambulatory Visit | Attending: Internal Medicine | Admitting: Internal Medicine

## 2013-03-26 ENCOUNTER — Other Ambulatory Visit: Payer: Self-pay | Admitting: *Deleted

## 2013-03-26 ENCOUNTER — Encounter (HOSPITAL_COMMUNITY)
Admission: RE | Admit: 2013-03-26 | Discharge: 2013-03-26 | Disposition: A | Discharge status: Home / Self Care | Payer: Self-pay | Source: Ambulatory Visit | Attending: Internal Medicine | Admitting: Internal Medicine

## 2013-03-26 MED ORDER — CLONIDINE HCL 0.2 MG PO TABS: 0.4000 mg | ORAL_TABLET | Freq: Three times a day (TID) | ORAL | Status: DC

## 2013-03-28 ENCOUNTER — Encounter (HOSPITAL_COMMUNITY)
Admission: RE | Admit: 2013-03-28 | Discharge: 2013-03-28 | Disposition: A | Discharge status: Home / Self Care | Payer: Self-pay | Source: Ambulatory Visit | Attending: Internal Medicine | Admitting: Internal Medicine

## 2013-03-30 ENCOUNTER — Encounter (HOSPITAL_COMMUNITY)
Admission: RE | Admit: 2013-03-30 | Discharge: 2013-03-30 | Disposition: A | Discharge status: Home / Self Care | Payer: Self-pay | Source: Ambulatory Visit | Attending: Internal Medicine | Admitting: Internal Medicine

## 2013-04-02 ENCOUNTER — Encounter (HOSPITAL_COMMUNITY)
Admission: RE | Admit: 2013-04-02 | Discharge: 2013-04-02 | Disposition: A | Discharge status: Home / Self Care | Payer: Self-pay | Source: Ambulatory Visit | Attending: Internal Medicine | Admitting: Internal Medicine

## 2013-04-02 ENCOUNTER — Encounter: Payer: Self-pay | Admitting: Internal Medicine

## 2013-04-04 ENCOUNTER — Encounter (HOSPITAL_COMMUNITY)
Admission: RE | Admit: 2013-04-04 | Discharge: 2013-04-04 | Disposition: A | Discharge status: Home / Self Care | Payer: Self-pay | Source: Ambulatory Visit | Attending: Internal Medicine | Admitting: Internal Medicine

## 2013-04-05 ENCOUNTER — Ambulatory Visit (INDEPENDENT_AMBULATORY_CARE_PROVIDER_SITE_OTHER): Payer: Medicare Other | Admitting: *Deleted

## 2013-04-05 DIAGNOSIS — Z7901 Long term (current) use of anticoagulants: Secondary | ICD-10-CM

## 2013-04-05 DIAGNOSIS — Z5181 Encounter for therapeutic drug level monitoring: Secondary | ICD-10-CM

## 2013-04-05 DIAGNOSIS — I4891 Unspecified atrial fibrillation: Secondary | ICD-10-CM

## 2013-04-05 LAB — POCT INR: INR: 3.1

## 2013-04-06 ENCOUNTER — Encounter (HOSPITAL_COMMUNITY)
Admission: RE | Admit: 2013-04-06 | Discharge: 2013-04-06 | Disposition: A | Discharge status: Home / Self Care | Payer: Self-pay | Source: Ambulatory Visit | Attending: Internal Medicine | Admitting: Internal Medicine

## 2013-04-09 ENCOUNTER — Encounter (HOSPITAL_COMMUNITY)
Admission: RE | Admit: 2013-04-09 | Discharge: 2013-04-09 | Disposition: A | Discharge status: Home / Self Care | Payer: Self-pay | Source: Ambulatory Visit | Attending: Internal Medicine | Admitting: Internal Medicine

## 2013-04-11 ENCOUNTER — Encounter (HOSPITAL_COMMUNITY)
Admission: RE | Admit: 2013-04-11 | Discharge: 2013-04-11 | Disposition: A | Discharge status: Home / Self Care | Payer: Self-pay | Source: Ambulatory Visit | Attending: Internal Medicine | Admitting: Internal Medicine

## 2013-04-13 ENCOUNTER — Encounter (HOSPITAL_COMMUNITY)
Admission: RE | Admit: 2013-04-13 | Discharge: 2013-04-13 | Disposition: A | Discharge status: Home / Self Care | Payer: Self-pay | Source: Ambulatory Visit | Attending: Internal Medicine | Admitting: Internal Medicine

## 2013-04-16 ENCOUNTER — Encounter (HOSPITAL_COMMUNITY): Payer: Medicare Other

## 2013-04-18 ENCOUNTER — Encounter (HOSPITAL_COMMUNITY)
Admission: RE | Admit: 2013-04-18 | Discharge: 2013-04-18 | Disposition: A | Discharge status: Home / Self Care | Payer: Self-pay | Source: Ambulatory Visit | Attending: Internal Medicine | Admitting: Internal Medicine

## 2013-04-18 DIAGNOSIS — Z5189 Encounter for other specified aftercare: Secondary | ICD-10-CM | POA: Insufficient documentation

## 2013-04-18 DIAGNOSIS — I2510 Atherosclerotic heart disease of native coronary artery without angina pectoris: Secondary | ICD-10-CM | POA: Insufficient documentation

## 2013-04-18 DIAGNOSIS — Z951 Presence of aortocoronary bypass graft: Secondary | ICD-10-CM | POA: Insufficient documentation

## 2013-04-20 ENCOUNTER — Encounter (HOSPITAL_COMMUNITY)
Admission: RE | Admit: 2013-04-20 | Discharge: 2013-04-20 | Disposition: A | Discharge status: Home / Self Care | Payer: Self-pay | Source: Ambulatory Visit | Attending: Internal Medicine | Admitting: Internal Medicine

## 2013-04-23 ENCOUNTER — Encounter (HOSPITAL_COMMUNITY)
Admission: RE | Admit: 2013-04-23 | Discharge: 2013-04-23 | Disposition: A | Discharge status: Home / Self Care | Payer: Self-pay | Source: Ambulatory Visit | Attending: Internal Medicine | Admitting: Internal Medicine

## 2013-04-25 ENCOUNTER — Encounter (HOSPITAL_COMMUNITY)
Admission: RE | Admit: 2013-04-25 | Discharge: 2013-04-25 | Disposition: A | Discharge status: Home / Self Care | Payer: Self-pay | Source: Ambulatory Visit | Attending: Internal Medicine | Admitting: Internal Medicine

## 2013-04-27 ENCOUNTER — Encounter (HOSPITAL_COMMUNITY)
Admission: RE | Admit: 2013-04-27 | Discharge: 2013-04-27 | Disposition: A | Discharge status: Home / Self Care | Payer: Self-pay | Source: Ambulatory Visit | Attending: Internal Medicine | Admitting: Internal Medicine

## 2013-04-30 ENCOUNTER — Encounter (HOSPITAL_COMMUNITY)
Admission: RE | Admit: 2013-04-30 | Discharge: 2013-04-30 | Disposition: A | Discharge status: Home / Self Care | Payer: Self-pay | Source: Ambulatory Visit | Attending: Internal Medicine | Admitting: Internal Medicine

## 2013-05-02 ENCOUNTER — Encounter (HOSPITAL_COMMUNITY)
Admission: RE | Admit: 2013-05-02 | Discharge: 2013-05-02 | Disposition: A | Discharge status: Home / Self Care | Payer: Self-pay | Source: Ambulatory Visit | Attending: Internal Medicine | Admitting: Internal Medicine

## 2013-05-03 ENCOUNTER — Ambulatory Visit (INDEPENDENT_AMBULATORY_CARE_PROVIDER_SITE_OTHER): Payer: Medicare Other | Admitting: Pharmacist Clinician (PhC)/ Clinical Pharmacy Specialist

## 2013-05-03 DIAGNOSIS — I4891 Unspecified atrial fibrillation: Secondary | ICD-10-CM

## 2013-05-03 DIAGNOSIS — Z5181 Encounter for therapeutic drug level monitoring: Secondary | ICD-10-CM

## 2013-05-03 DIAGNOSIS — Z7901 Long term (current) use of anticoagulants: Secondary | ICD-10-CM

## 2013-05-03 LAB — POCT INR: INR: 2.2

## 2013-05-04 ENCOUNTER — Encounter (HOSPITAL_COMMUNITY): Payer: Medicare Other

## 2013-05-07 ENCOUNTER — Encounter (HOSPITAL_COMMUNITY): Payer: Medicare Other

## 2013-05-09 ENCOUNTER — Encounter (HOSPITAL_COMMUNITY)
Admission: RE | Admit: 2013-05-09 | Discharge: 2013-05-09 | Disposition: A | Discharge status: Home / Self Care | Payer: Self-pay | Source: Ambulatory Visit | Attending: Internal Medicine | Admitting: Internal Medicine

## 2013-05-11 ENCOUNTER — Encounter (HOSPITAL_COMMUNITY)
Admission: RE | Admit: 2013-05-11 | Discharge: 2013-05-11 | Disposition: A | Discharge status: Home / Self Care | Payer: Self-pay | Source: Ambulatory Visit | Attending: Internal Medicine | Admitting: Internal Medicine

## 2013-05-14 ENCOUNTER — Encounter (HOSPITAL_COMMUNITY)
Admission: RE | Admit: 2013-05-14 | Discharge: 2013-05-14 | Disposition: A | Discharge status: Home / Self Care | Payer: Self-pay | Source: Ambulatory Visit | Attending: Internal Medicine | Admitting: Internal Medicine

## 2013-05-16 ENCOUNTER — Encounter (HOSPITAL_COMMUNITY)
Admission: RE | Admit: 2013-05-16 | Discharge: 2013-05-16 | Disposition: A | Discharge status: Home / Self Care | Payer: Self-pay | Source: Ambulatory Visit | Attending: Internal Medicine | Admitting: Internal Medicine

## 2013-05-18 ENCOUNTER — Encounter (HOSPITAL_COMMUNITY)
Admission: RE | Admit: 2013-05-18 | Discharge: 2013-05-18 | Disposition: A | Discharge status: Home / Self Care | Payer: Self-pay | Source: Ambulatory Visit | Attending: Internal Medicine | Admitting: Internal Medicine

## 2013-05-18 DIAGNOSIS — I2510 Atherosclerotic heart disease of native coronary artery without angina pectoris: Secondary | ICD-10-CM | POA: Insufficient documentation

## 2013-05-18 DIAGNOSIS — Z951 Presence of aortocoronary bypass graft: Secondary | ICD-10-CM | POA: Insufficient documentation

## 2013-05-18 DIAGNOSIS — Z5189 Encounter for other specified aftercare: Secondary | ICD-10-CM | POA: Insufficient documentation

## 2013-05-21 ENCOUNTER — Encounter (HOSPITAL_COMMUNITY)
Admission: RE | Admit: 2013-05-21 | Discharge: 2013-05-21 | Disposition: A | Discharge status: Home / Self Care | Payer: Self-pay | Source: Ambulatory Visit | Attending: Internal Medicine | Admitting: Internal Medicine

## 2013-05-23 ENCOUNTER — Other Ambulatory Visit: Payer: Self-pay | Admitting: Internal Medicine

## 2013-05-23 ENCOUNTER — Encounter (HOSPITAL_COMMUNITY)
Admission: RE | Admit: 2013-05-23 | Discharge: 2013-05-23 | Disposition: A | Discharge status: Home / Self Care | Payer: Self-pay | Source: Ambulatory Visit | Attending: Internal Medicine | Admitting: Internal Medicine

## 2013-05-24 ENCOUNTER — Encounter: Payer: Self-pay | Admitting: Internal Medicine

## 2013-05-24 ENCOUNTER — Ambulatory Visit (INDEPENDENT_AMBULATORY_CARE_PROVIDER_SITE_OTHER): Payer: Medicare Other | Admitting: *Deleted

## 2013-05-24 ENCOUNTER — Ambulatory Visit (INDEPENDENT_AMBULATORY_CARE_PROVIDER_SITE_OTHER): Payer: Medicare Other | Admitting: Internal Medicine

## 2013-05-24 VITALS — BP 110/58 | HR 64 | Ht 69.0 in | Wt 172.4 lb

## 2013-05-24 DIAGNOSIS — Z7901 Long term (current) use of anticoagulants: Secondary | ICD-10-CM

## 2013-05-24 DIAGNOSIS — I10 Essential (primary) hypertension: Secondary | ICD-10-CM

## 2013-05-24 DIAGNOSIS — I4891 Unspecified atrial fibrillation: Secondary | ICD-10-CM

## 2013-05-24 DIAGNOSIS — Z950 Presence of cardiac pacemaker: Secondary | ICD-10-CM

## 2013-05-24 DIAGNOSIS — Z5181 Encounter for therapeutic drug level monitoring: Secondary | ICD-10-CM

## 2013-05-24 LAB — MDC_IDC_ENUM_SESS_TYPE_INCLINIC
Battery Remaining Longevity: 123.6 mo
Battery Voltage: 2.98 V
Date Time Interrogation Session: 20150507092551
Implantable Pulse Generator Serial Number: 2574597
Lead Channel Impedance Value: 575 Ohm
Lead Channel Pacing Threshold Amplitude: 0.75 V
Lead Channel Setting Pacing Pulse Width: 0.4 ms
Lead Channel Setting Sensing Sensitivity: 4 mV
MDC IDC MSMT LEADCHNL RV PACING THRESHOLD PULSEWIDTH: 0.4 ms
MDC IDC SET LEADCHNL RV PACING AMPLITUDE: 2.5 V
MDC IDC STAT BRADY RV PERCENT PACED: 99.35 %

## 2013-05-24 LAB — POCT INR: INR: 2.4

## 2013-05-24 NOTE — Patient Instructions (Signed)
Your physician wants you to follow-up in: 1 year with Dr. Lovena Le. You will receive a reminder letter in the mail two months in advance. If you don't receive a letter, please call our office to schedule the follow-up appointment.  Your physician recommends that you continue on your current medications as directed. Please refer to the Current Medication list given to you today.

## 2013-05-24 NOTE — Assessment & Plan Note (Signed)
Blood pressure is normal. Will continue current meds and maintain a low sodium diet.

## 2013-05-24 NOTE — Assessment & Plan Note (Signed)
His St.Jude VVI PM is working normally. Will recheck in several months. 

## 2013-05-24 NOTE — Progress Notes (Signed)
HPI [name] returns today for followup. He is a very pleasant 80 year old man with a history of symptomatic bradycardia, chronic atrial fibrillation, complete heart block, status post pacemaker insertion. He complains of increasing peripheral edema. He admits to sodium indiscretion. He has had problems with cellulitis in his lower extremities but this has been better by wearing support hose. He also has venous insufficiency. He is participating in cardiac rehab and doing well. Allergies  Allergen Reactions  . Penicillins Swelling     Current Outpatient Prescriptions  Medication Sig Dispense Refill  . acetaminophen (TYLENOL) 500 MG chewable tablet Chew 500 mg by mouth every 6 (six) hours as needed. For pain      . atorvastatin (LIPITOR) 40 MG tablet Take 20 mg by mouth daily.       . Calcium Carbonate Antacid (MAALOX) 600 MG chewable tablet Chew 600 mg by mouth as needed.      . cloNIDine (CATAPRES) 0.2 MG tablet Take 2 tablets (0.4 mg total) by mouth 3 (three) times daily.  180 tablet  0  . cyanocobalamin 100 MCG tablet Take 100 mcg by mouth daily.      . digoxin (LANOXIN) 0.25 MG tablet Take 0.125 mcg by mouth daily.       . Exenatide (BYETTA 5 MCG PEN [place]) Inject 5 mg into the skin 2 (two) times daily.       . ferrous sulfate 325 (65 FE) MG EC tablet Take 325 mg by mouth daily.       . furosemide (LASIX) 40 MG tablet Take 1 1/2 tablets by mouth daily  45 tablet  11  . insulin glargine (LANTUS) 100 UNIT/ML injection Inject 13 Units into the skin at bedtime.      . metoprolol (TOPROL-XL) 50 MG 24 hr tablet Take 50 mg by mouth daily.        . nitroGLYCERIN (NITROSTAT) 0.4 MG SL tablet Place 0.4 mg under the tongue every 5 (five) minutes as needed.        . ramipril (ALTACE) 5 MG capsule Take 5 mg by mouth daily.        Marland Kitchen warfarin (COUMADIN) 5 MG tablet Take 2.5-5 mg by mouth daily. Takes 5mg  daily except on tue and thur takes 2.5mg       . clindamycin (CLEOCIN) 150 MG capsule Take 150 mg by  mouth 4 (four) times daily. Prior to dental work       No current facility-administered medications for this visit.     Past Medical History  Diagnosis Date  . A-fib   . Hypertension   . CAD (coronary artery disease)   . Diabetes mellitus     ROS:   All systems reviewed and negative except as noted in the HPI.   Past Surgical History  Procedure Laterality Date  . Cholecystectomy    . Coronary artery bypass graft    . Pacemaker insertion    . Cardiac catheterization  2001, 2004  . Cataract surgery  2000     Family History  Problem Relation Age of Onset  . Diabetes Mother   . Coronary artery disease Father   . Diabetes Sister      History   Social History  . Marital Status: Widowed    Spouse Name: N/A    Number of Children: N/A  . Years of Education: N/A   Occupational History  . Not on file.   Social History Main Topics  . Smoking status: Never Smoker   .  Smokeless tobacco: Not on file  . Alcohol Use: No  . Drug Use: No  . Sexual Activity: Not on file   Other Topics Concern  . Not on file   Social History Narrative  . No narrative on file     BP 110/58  Pulse 64  Ht 5\' 9"  (1.753 m)  Wt 172 lb 6.4 oz (78.2 kg)  BMI 25.45 kg/m2  Physical Exam:  elderly appearing 80 year old man,NAD HEENT: Unremarkable Neck:  8 cm JVD, no thyromegally Back:  No CVA tenderness Lungs:  Clear with no wheezes, rales, or rhonchi. HEART:  Regular rate rhythm, no murmurs, no rubs, no clicks, soft S4 gallop, questionable right-sided Abd:  soft, positive bowel sounds, no organomegally, no rebound, no guarding Ext:  2 plus pulses, 3+ peripheral edema, no cyanosis, no clubbing Skin:  No rashes no nodules Neuro:  CN II through XII intact, motor grossly intact  DEVICE  Normal device function.  See PaceArt for details.   Assess/Plan:

## 2013-05-24 NOTE — Assessment & Plan Note (Signed)
His ventricular rate is well controlled. He will continue his current medical therapy. 

## 2013-05-25 ENCOUNTER — Encounter (HOSPITAL_COMMUNITY)
Admission: RE | Admit: 2013-05-25 | Discharge: 2013-05-25 | Disposition: A | Discharge status: Home / Self Care | Payer: Self-pay | Source: Ambulatory Visit | Attending: Internal Medicine | Admitting: Internal Medicine

## 2013-05-28 ENCOUNTER — Encounter (HOSPITAL_COMMUNITY)
Admission: RE | Admit: 2013-05-28 | Discharge: 2013-05-28 | Disposition: A | Discharge status: Home / Self Care | Payer: Self-pay | Source: Ambulatory Visit | Attending: Internal Medicine | Admitting: Internal Medicine

## 2013-05-30 ENCOUNTER — Encounter (HOSPITAL_COMMUNITY)
Admission: RE | Admit: 2013-05-30 | Discharge: 2013-05-30 | Disposition: A | Discharge status: Home / Self Care | Payer: Self-pay | Source: Ambulatory Visit | Attending: Internal Medicine | Admitting: Internal Medicine

## 2013-05-31 ENCOUNTER — Encounter: Payer: Self-pay | Admitting: Internal Medicine

## 2013-06-01 ENCOUNTER — Encounter (HOSPITAL_COMMUNITY)
Admission: RE | Admit: 2013-06-01 | Discharge: 2013-06-01 | Disposition: A | Discharge status: Home / Self Care | Payer: Self-pay | Source: Ambulatory Visit | Attending: Internal Medicine | Admitting: Internal Medicine

## 2013-06-04 ENCOUNTER — Encounter (HOSPITAL_COMMUNITY)
Admission: RE | Admit: 2013-06-04 | Discharge: 2013-06-04 | Disposition: A | Discharge status: Home / Self Care | Payer: Self-pay | Source: Ambulatory Visit | Attending: Internal Medicine | Admitting: Internal Medicine

## 2013-06-06 ENCOUNTER — Encounter (HOSPITAL_COMMUNITY)
Admission: RE | Admit: 2013-06-06 | Discharge: 2013-06-06 | Disposition: A | Discharge status: Home / Self Care | Payer: Self-pay | Source: Ambulatory Visit | Attending: Internal Medicine | Admitting: Internal Medicine

## 2013-06-08 ENCOUNTER — Encounter (HOSPITAL_COMMUNITY)
Admission: RE | Admit: 2013-06-08 | Discharge: 2013-06-08 | Disposition: A | Discharge status: Home / Self Care | Payer: Self-pay | Source: Ambulatory Visit | Attending: Internal Medicine | Admitting: Internal Medicine

## 2013-06-13 ENCOUNTER — Encounter (HOSPITAL_COMMUNITY)
Admission: RE | Admit: 2013-06-13 | Discharge: 2013-06-13 | Disposition: A | Discharge status: Home / Self Care | Payer: Self-pay | Source: Ambulatory Visit | Attending: Internal Medicine | Admitting: Internal Medicine

## 2013-06-15 ENCOUNTER — Encounter (HOSPITAL_COMMUNITY)
Admission: RE | Admit: 2013-06-15 | Discharge: 2013-06-15 | Disposition: A | Discharge status: Home / Self Care | Payer: Self-pay | Source: Ambulatory Visit | Attending: Internal Medicine | Admitting: Internal Medicine

## 2013-06-18 ENCOUNTER — Encounter (HOSPITAL_COMMUNITY)
Admission: RE | Admit: 2013-06-18 | Discharge: 2013-06-18 | Disposition: A | Discharge status: Home / Self Care | Payer: Self-pay | Source: Ambulatory Visit | Attending: Internal Medicine | Admitting: Internal Medicine

## 2013-06-18 DIAGNOSIS — Z951 Presence of aortocoronary bypass graft: Secondary | ICD-10-CM | POA: Insufficient documentation

## 2013-06-18 DIAGNOSIS — Z5189 Encounter for other specified aftercare: Secondary | ICD-10-CM | POA: Insufficient documentation

## 2013-06-18 DIAGNOSIS — I2510 Atherosclerotic heart disease of native coronary artery without angina pectoris: Secondary | ICD-10-CM | POA: Insufficient documentation

## 2013-06-20 ENCOUNTER — Encounter (HOSPITAL_COMMUNITY)
Admission: RE | Admit: 2013-06-20 | Discharge: 2013-06-20 | Disposition: A | Discharge status: Home / Self Care | Payer: Self-pay | Source: Ambulatory Visit | Attending: Internal Medicine | Admitting: Internal Medicine

## 2013-06-22 ENCOUNTER — Encounter (HOSPITAL_COMMUNITY)
Admission: RE | Admit: 2013-06-22 | Discharge: 2013-06-22 | Disposition: A | Discharge status: Home / Self Care | Payer: Self-pay | Source: Ambulatory Visit | Attending: Internal Medicine | Admitting: Internal Medicine

## 2013-06-25 ENCOUNTER — Encounter (HOSPITAL_COMMUNITY)
Admission: RE | Admit: 2013-06-25 | Discharge: 2013-06-25 | Disposition: A | Discharge status: Home / Self Care | Payer: Self-pay | Source: Ambulatory Visit | Attending: Internal Medicine | Admitting: Internal Medicine

## 2013-06-27 ENCOUNTER — Encounter (HOSPITAL_COMMUNITY)
Admission: RE | Admit: 2013-06-27 | Discharge: 2013-06-27 | Disposition: A | Discharge status: Home / Self Care | Payer: Self-pay | Source: Ambulatory Visit | Attending: Internal Medicine | Admitting: Internal Medicine

## 2013-06-28 ENCOUNTER — Ambulatory Visit (INDEPENDENT_AMBULATORY_CARE_PROVIDER_SITE_OTHER): Payer: Medicare Other

## 2013-06-28 DIAGNOSIS — Z7901 Long term (current) use of anticoagulants: Secondary | ICD-10-CM

## 2013-06-28 DIAGNOSIS — Z5181 Encounter for therapeutic drug level monitoring: Secondary | ICD-10-CM

## 2013-06-28 DIAGNOSIS — I4891 Unspecified atrial fibrillation: Secondary | ICD-10-CM

## 2013-06-28 LAB — POCT INR: INR: 2.7

## 2013-06-29 ENCOUNTER — Encounter (HOSPITAL_COMMUNITY)
Admission: RE | Admit: 2013-06-29 | Discharge: 2013-06-29 | Disposition: A | Discharge status: Home / Self Care | Payer: Self-pay | Source: Ambulatory Visit | Attending: Internal Medicine | Admitting: Internal Medicine

## 2013-07-02 ENCOUNTER — Encounter (HOSPITAL_COMMUNITY): Payer: Self-pay

## 2013-07-04 ENCOUNTER — Encounter (HOSPITAL_COMMUNITY): Payer: Self-pay

## 2013-07-06 ENCOUNTER — Encounter (HOSPITAL_COMMUNITY): Payer: Self-pay

## 2013-07-09 ENCOUNTER — Encounter (HOSPITAL_COMMUNITY): Payer: Self-pay

## 2013-07-11 ENCOUNTER — Encounter (HOSPITAL_COMMUNITY): Payer: Self-pay

## 2013-07-13 ENCOUNTER — Encounter (HOSPITAL_COMMUNITY): Payer: Self-pay

## 2013-07-16 ENCOUNTER — Encounter (HOSPITAL_COMMUNITY): Admission: RE | Admit: 2013-07-16 | Payer: Self-pay | Source: Ambulatory Visit

## 2013-07-18 ENCOUNTER — Encounter (HOSPITAL_COMMUNITY): Payer: Medicare Other

## 2013-07-18 DIAGNOSIS — Z5189 Encounter for other specified aftercare: Secondary | ICD-10-CM | POA: Insufficient documentation

## 2013-07-18 DIAGNOSIS — Z951 Presence of aortocoronary bypass graft: Secondary | ICD-10-CM | POA: Insufficient documentation

## 2013-07-18 DIAGNOSIS — I2510 Atherosclerotic heart disease of native coronary artery without angina pectoris: Secondary | ICD-10-CM | POA: Insufficient documentation

## 2013-07-23 ENCOUNTER — Encounter (HOSPITAL_COMMUNITY): Payer: Self-pay

## 2013-07-25 ENCOUNTER — Encounter (HOSPITAL_COMMUNITY): Payer: Self-pay

## 2013-07-27 ENCOUNTER — Encounter (HOSPITAL_COMMUNITY): Payer: Self-pay

## 2013-07-30 ENCOUNTER — Encounter (HOSPITAL_COMMUNITY)
Admission: RE | Admit: 2013-07-30 | Discharge: 2013-07-30 | Disposition: A | Discharge status: Home / Self Care | Payer: Self-pay | Source: Ambulatory Visit | Attending: Internal Medicine | Admitting: Internal Medicine

## 2013-08-01 ENCOUNTER — Encounter (HOSPITAL_COMMUNITY)
Admission: RE | Admit: 2013-08-01 | Discharge: 2013-08-01 | Disposition: A | Discharge status: Home / Self Care | Payer: Self-pay | Source: Ambulatory Visit | Attending: Internal Medicine | Admitting: Internal Medicine

## 2013-08-03 ENCOUNTER — Encounter (HOSPITAL_COMMUNITY)
Admission: RE | Admit: 2013-08-03 | Discharge: 2013-08-03 | Disposition: A | Discharge status: Home / Self Care | Payer: Self-pay | Source: Ambulatory Visit | Attending: Internal Medicine | Admitting: Internal Medicine

## 2013-08-06 ENCOUNTER — Encounter (HOSPITAL_COMMUNITY)
Admission: RE | Admit: 2013-08-06 | Discharge: 2013-08-06 | Disposition: A | Discharge status: Home / Self Care | Payer: Self-pay | Source: Ambulatory Visit | Attending: Internal Medicine | Admitting: Internal Medicine

## 2013-08-08 ENCOUNTER — Encounter (HOSPITAL_COMMUNITY)
Admission: RE | Admit: 2013-08-08 | Discharge: 2013-08-08 | Disposition: A | Discharge status: Home / Self Care | Payer: Self-pay | Source: Ambulatory Visit | Attending: Internal Medicine | Admitting: Internal Medicine

## 2013-08-09 ENCOUNTER — Ambulatory Visit (INDEPENDENT_AMBULATORY_CARE_PROVIDER_SITE_OTHER): Payer: Medicare Other | Admitting: *Deleted

## 2013-08-09 DIAGNOSIS — Z7901 Long term (current) use of anticoagulants: Secondary | ICD-10-CM

## 2013-08-09 DIAGNOSIS — Z5181 Encounter for therapeutic drug level monitoring: Secondary | ICD-10-CM

## 2013-08-09 DIAGNOSIS — I4891 Unspecified atrial fibrillation: Secondary | ICD-10-CM

## 2013-08-09 LAB — POCT INR: INR: 3.7

## 2013-08-10 ENCOUNTER — Encounter (HOSPITAL_COMMUNITY)
Admission: RE | Admit: 2013-08-10 | Discharge: 2013-08-10 | Disposition: A | Discharge status: Home / Self Care | Payer: Self-pay | Source: Ambulatory Visit | Attending: Internal Medicine | Admitting: Internal Medicine

## 2013-08-13 ENCOUNTER — Encounter (HOSPITAL_COMMUNITY)
Admission: RE | Admit: 2013-08-13 | Discharge: 2013-08-13 | Disposition: A | Discharge status: Home / Self Care | Payer: Self-pay | Source: Ambulatory Visit | Attending: Internal Medicine | Admitting: Internal Medicine

## 2013-08-15 ENCOUNTER — Encounter (HOSPITAL_COMMUNITY)
Admission: RE | Admit: 2013-08-15 | Discharge: 2013-08-15 | Disposition: A | Discharge status: Home / Self Care | Payer: Self-pay | Source: Ambulatory Visit | Attending: Internal Medicine | Admitting: Internal Medicine

## 2013-08-17 ENCOUNTER — Encounter (HOSPITAL_COMMUNITY): Payer: Self-pay

## 2013-08-20 ENCOUNTER — Encounter (HOSPITAL_COMMUNITY): Payer: Medicare Other

## 2013-08-20 DIAGNOSIS — Z5189 Encounter for other specified aftercare: Secondary | ICD-10-CM | POA: Insufficient documentation

## 2013-08-20 DIAGNOSIS — I2510 Atherosclerotic heart disease of native coronary artery without angina pectoris: Secondary | ICD-10-CM | POA: Insufficient documentation

## 2013-08-20 DIAGNOSIS — Z951 Presence of aortocoronary bypass graft: Secondary | ICD-10-CM | POA: Insufficient documentation

## 2013-08-22 ENCOUNTER — Encounter (HOSPITAL_COMMUNITY): Payer: Self-pay

## 2013-08-24 ENCOUNTER — Encounter (HOSPITAL_COMMUNITY): Payer: Self-pay

## 2013-08-27 ENCOUNTER — Encounter (HOSPITAL_COMMUNITY): Payer: Self-pay

## 2013-08-27 ENCOUNTER — Telehealth: Payer: Self-pay | Admitting: Cardiology

## 2013-08-27 ENCOUNTER — Encounter: Payer: Medicare Other | Admitting: *Deleted

## 2013-08-27 NOTE — Telephone Encounter (Signed)
LMOVM reminding pt to send remote transmission.   

## 2013-08-28 ENCOUNTER — Encounter: Payer: Self-pay | Admitting: Cardiology

## 2013-08-29 ENCOUNTER — Encounter (HOSPITAL_COMMUNITY): Payer: Self-pay

## 2013-08-31 ENCOUNTER — Encounter (HOSPITAL_COMMUNITY): Payer: Self-pay

## 2013-09-03 ENCOUNTER — Encounter (HOSPITAL_COMMUNITY): Payer: Self-pay

## 2013-09-05 ENCOUNTER — Encounter (HOSPITAL_COMMUNITY): Payer: Self-pay

## 2013-09-07 ENCOUNTER — Encounter (HOSPITAL_COMMUNITY): Payer: Self-pay

## 2013-09-10 ENCOUNTER — Ambulatory Visit (INDEPENDENT_AMBULATORY_CARE_PROVIDER_SITE_OTHER): Payer: Medicare Other | Admitting: *Deleted

## 2013-09-10 ENCOUNTER — Encounter (HOSPITAL_COMMUNITY): Payer: Self-pay

## 2013-09-10 ENCOUNTER — Other Ambulatory Visit: Payer: Self-pay | Admitting: Internal Medicine

## 2013-09-10 DIAGNOSIS — Z7901 Long term (current) use of anticoagulants: Secondary | ICD-10-CM

## 2013-09-10 DIAGNOSIS — Z5181 Encounter for therapeutic drug level monitoring: Secondary | ICD-10-CM

## 2013-09-10 DIAGNOSIS — I495 Sick sinus syndrome: Secondary | ICD-10-CM

## 2013-09-10 DIAGNOSIS — I4891 Unspecified atrial fibrillation: Secondary | ICD-10-CM

## 2013-09-10 LAB — POCT INR: INR: 2.1

## 2013-09-10 MED ORDER — WARFARIN SODIUM 5 MG PO TABS: ORAL_TABLET | ORAL | Status: DC

## 2013-09-11 NOTE — Progress Notes (Signed)
Remote pacemaker transmission.   

## 2013-09-12 ENCOUNTER — Encounter (HOSPITAL_COMMUNITY)
Admission: RE | Admit: 2013-09-12 | Discharge: 2013-09-12 | Disposition: A | Discharge status: Home / Self Care | Payer: Self-pay | Source: Ambulatory Visit | Attending: Internal Medicine | Admitting: Internal Medicine

## 2013-09-14 ENCOUNTER — Encounter (HOSPITAL_COMMUNITY)
Admission: RE | Admit: 2013-09-14 | Discharge: 2013-09-14 | Disposition: A | Discharge status: Home / Self Care | Payer: Self-pay | Source: Ambulatory Visit | Attending: Internal Medicine | Admitting: Internal Medicine

## 2013-09-14 LAB — MDC_IDC_ENUM_SESS_TYPE_REMOTE
Battery Remaining Longevity: 128 mo
Battery Remaining Percentage: 91 %
Brady Statistic RV Percent Paced: 99 %
Date Time Interrogation Session: 20150824180803
Implantable Pulse Generator Serial Number: 2574597
Lead Channel Pacing Threshold Amplitude: 0.75 V
Lead Channel Pacing Threshold Pulse Width: 0.4 ms
Lead Channel Setting Pacing Amplitude: 2.5 V
Lead Channel Setting Pacing Pulse Width: 0.4 ms
Lead Channel Setting Sensing Sensitivity: 4 mV
MDC IDC MSMT BATTERY VOLTAGE: 2.98 V
MDC IDC MSMT LEADCHNL RV IMPEDANCE VALUE: 580 Ohm
MDC IDC MSMT LEADCHNL RV SENSING INTR AMPL: 9.5 mV

## 2013-09-17 ENCOUNTER — Encounter (HOSPITAL_COMMUNITY)
Admission: RE | Admit: 2013-09-17 | Discharge: 2013-09-17 | Disposition: A | Discharge status: Home / Self Care | Payer: Self-pay | Source: Ambulatory Visit | Attending: Internal Medicine | Admitting: Internal Medicine

## 2013-09-19 ENCOUNTER — Encounter (HOSPITAL_COMMUNITY)
Admission: RE | Admit: 2013-09-19 | Discharge: 2013-09-19 | Disposition: A | Discharge status: Home / Self Care | Payer: Self-pay | Source: Ambulatory Visit | Attending: Internal Medicine | Admitting: Internal Medicine

## 2013-09-19 DIAGNOSIS — I2510 Atherosclerotic heart disease of native coronary artery without angina pectoris: Secondary | ICD-10-CM | POA: Insufficient documentation

## 2013-09-19 DIAGNOSIS — Z951 Presence of aortocoronary bypass graft: Secondary | ICD-10-CM | POA: Insufficient documentation

## 2013-09-19 DIAGNOSIS — Z5189 Encounter for other specified aftercare: Secondary | ICD-10-CM | POA: Insufficient documentation

## 2013-09-21 ENCOUNTER — Encounter (HOSPITAL_COMMUNITY): Payer: Self-pay

## 2013-09-26 ENCOUNTER — Encounter (HOSPITAL_COMMUNITY)
Admission: RE | Admit: 2013-09-26 | Discharge: 2013-09-26 | Disposition: A | Discharge status: Home / Self Care | Payer: Self-pay | Source: Ambulatory Visit | Attending: Internal Medicine | Admitting: Internal Medicine

## 2013-09-27 ENCOUNTER — Encounter: Payer: Self-pay | Admitting: Cardiology

## 2013-09-28 ENCOUNTER — Encounter (HOSPITAL_COMMUNITY)
Admission: RE | Admit: 2013-09-28 | Discharge: 2013-09-28 | Disposition: A | Discharge status: Home / Self Care | Payer: Self-pay | Source: Ambulatory Visit | Attending: Internal Medicine | Admitting: Internal Medicine

## 2013-10-01 ENCOUNTER — Encounter (HOSPITAL_COMMUNITY)
Admission: RE | Admit: 2013-10-01 | Discharge: 2013-10-01 | Disposition: A | Discharge status: Home / Self Care | Payer: Self-pay | Source: Ambulatory Visit | Attending: Internal Medicine | Admitting: Internal Medicine

## 2013-10-02 ENCOUNTER — Encounter: Payer: Self-pay | Admitting: Cardiology

## 2013-10-03 ENCOUNTER — Encounter (HOSPITAL_COMMUNITY)
Admission: RE | Admit: 2013-10-03 | Discharge: 2013-10-03 | Disposition: A | Discharge status: Home / Self Care | Payer: Self-pay | Source: Ambulatory Visit | Attending: Internal Medicine | Admitting: Internal Medicine

## 2013-10-05 ENCOUNTER — Encounter (HOSPITAL_COMMUNITY)
Admission: RE | Admit: 2013-10-05 | Discharge: 2013-10-05 | Disposition: A | Discharge status: Home / Self Care | Payer: Self-pay | Source: Ambulatory Visit | Attending: Internal Medicine | Admitting: Internal Medicine

## 2013-10-08 ENCOUNTER — Ambulatory Visit (INDEPENDENT_AMBULATORY_CARE_PROVIDER_SITE_OTHER): Payer: Medicare Other

## 2013-10-08 ENCOUNTER — Encounter (HOSPITAL_COMMUNITY)
Admission: RE | Admit: 2013-10-08 | Discharge: 2013-10-08 | Disposition: A | Discharge status: Home / Self Care | Payer: Self-pay | Source: Ambulatory Visit | Attending: Internal Medicine | Admitting: Internal Medicine

## 2013-10-08 DIAGNOSIS — Z7901 Long term (current) use of anticoagulants: Secondary | ICD-10-CM

## 2013-10-08 DIAGNOSIS — I4891 Unspecified atrial fibrillation: Secondary | ICD-10-CM

## 2013-10-08 DIAGNOSIS — Z5181 Encounter for therapeutic drug level monitoring: Secondary | ICD-10-CM

## 2013-10-08 LAB — POCT INR: INR: 2.2

## 2013-10-09 ENCOUNTER — Encounter: Payer: Self-pay | Admitting: Internal Medicine

## 2013-10-10 ENCOUNTER — Encounter (HOSPITAL_COMMUNITY)
Admission: RE | Admit: 2013-10-10 | Discharge: 2013-10-10 | Disposition: A | Discharge status: Home / Self Care | Payer: Self-pay | Source: Ambulatory Visit | Attending: Internal Medicine | Admitting: Internal Medicine

## 2013-10-12 ENCOUNTER — Encounter (HOSPITAL_COMMUNITY)
Admission: RE | Admit: 2013-10-12 | Discharge: 2013-10-12 | Disposition: A | Discharge status: Home / Self Care | Payer: Self-pay | Source: Ambulatory Visit | Attending: Internal Medicine | Admitting: Internal Medicine

## 2013-10-15 ENCOUNTER — Encounter (HOSPITAL_COMMUNITY)
Admission: RE | Admit: 2013-10-15 | Discharge: 2013-10-15 | Disposition: A | Discharge status: Home / Self Care | Payer: Self-pay | Source: Ambulatory Visit | Attending: Internal Medicine | Admitting: Internal Medicine

## 2013-10-17 ENCOUNTER — Encounter (HOSPITAL_COMMUNITY)
Admission: RE | Admit: 2013-10-17 | Discharge: 2013-10-17 | Disposition: A | Discharge status: Home / Self Care | Payer: Self-pay | Source: Ambulatory Visit | Attending: Internal Medicine | Admitting: Internal Medicine

## 2013-10-19 ENCOUNTER — Encounter (HOSPITAL_COMMUNITY)
Admission: RE | Admit: 2013-10-19 | Discharge: 2013-10-19 | Disposition: A | Discharge status: Home / Self Care | Payer: Self-pay | Source: Ambulatory Visit | Attending: Internal Medicine | Admitting: Internal Medicine

## 2013-10-19 DIAGNOSIS — Z955 Presence of coronary angioplasty implant and graft: Secondary | ICD-10-CM | POA: Insufficient documentation

## 2013-10-19 DIAGNOSIS — Z5189 Encounter for other specified aftercare: Secondary | ICD-10-CM | POA: Insufficient documentation

## 2013-10-19 DIAGNOSIS — I2510 Atherosclerotic heart disease of native coronary artery without angina pectoris: Secondary | ICD-10-CM | POA: Insufficient documentation

## 2013-10-22 ENCOUNTER — Encounter (HOSPITAL_COMMUNITY)
Admission: RE | Admit: 2013-10-22 | Discharge: 2013-10-22 | Disposition: A | Discharge status: Home / Self Care | Payer: Self-pay | Source: Ambulatory Visit | Attending: Internal Medicine | Admitting: Internal Medicine

## 2013-10-24 ENCOUNTER — Encounter (HOSPITAL_COMMUNITY)
Admission: RE | Admit: 2013-10-24 | Discharge: 2013-10-24 | Disposition: A | Discharge status: Home / Self Care | Payer: Self-pay | Source: Ambulatory Visit | Attending: Internal Medicine | Admitting: Internal Medicine

## 2013-11-07 ENCOUNTER — Encounter (HOSPITAL_COMMUNITY): Payer: Self-pay

## 2013-11-09 ENCOUNTER — Encounter (HOSPITAL_COMMUNITY): Payer: Self-pay

## 2013-11-12 ENCOUNTER — Encounter (HOSPITAL_COMMUNITY): Admission: RE | Admit: 2013-11-12 | Payer: Self-pay | Source: Ambulatory Visit

## 2013-11-13 ENCOUNTER — Ambulatory Visit (INDEPENDENT_AMBULATORY_CARE_PROVIDER_SITE_OTHER): Payer: Medicare Other

## 2013-11-13 DIAGNOSIS — Z5181 Encounter for therapeutic drug level monitoring: Secondary | ICD-10-CM

## 2013-11-13 DIAGNOSIS — Z23 Encounter for immunization: Secondary | ICD-10-CM

## 2013-11-13 DIAGNOSIS — I4891 Unspecified atrial fibrillation: Secondary | ICD-10-CM

## 2013-11-13 DIAGNOSIS — Z7901 Long term (current) use of anticoagulants: Secondary | ICD-10-CM

## 2013-11-13 LAB — POCT INR: INR: 3.2

## 2013-11-14 ENCOUNTER — Encounter (HOSPITAL_COMMUNITY)
Admission: RE | Admit: 2013-11-14 | Discharge: 2013-11-14 | Disposition: A | Discharge status: Home / Self Care | Payer: Self-pay | Source: Ambulatory Visit | Attending: Internal Medicine | Admitting: Internal Medicine

## 2013-11-16 ENCOUNTER — Encounter (HOSPITAL_COMMUNITY)
Admission: RE | Admit: 2013-11-16 | Discharge: 2013-11-16 | Disposition: A | Discharge status: Home / Self Care | Payer: Self-pay | Source: Ambulatory Visit | Attending: Internal Medicine | Admitting: Internal Medicine

## 2013-11-19 ENCOUNTER — Encounter (HOSPITAL_COMMUNITY)
Admission: RE | Admit: 2013-11-19 | Discharge: 2013-11-19 | Disposition: A | Discharge status: Home / Self Care | Payer: Self-pay | Source: Ambulatory Visit | Attending: Internal Medicine | Admitting: Internal Medicine

## 2013-11-19 DIAGNOSIS — I2510 Atherosclerotic heart disease of native coronary artery without angina pectoris: Secondary | ICD-10-CM | POA: Insufficient documentation

## 2013-11-19 DIAGNOSIS — Z5189 Encounter for other specified aftercare: Secondary | ICD-10-CM | POA: Insufficient documentation

## 2013-11-19 DIAGNOSIS — Z955 Presence of coronary angioplasty implant and graft: Secondary | ICD-10-CM | POA: Insufficient documentation

## 2013-11-21 ENCOUNTER — Encounter (HOSPITAL_COMMUNITY): Payer: Self-pay

## 2013-11-23 ENCOUNTER — Encounter (HOSPITAL_COMMUNITY)
Admission: RE | Admit: 2013-11-23 | Discharge: 2013-11-23 | Disposition: A | Discharge status: Home / Self Care | Payer: Self-pay | Source: Ambulatory Visit | Attending: Internal Medicine | Admitting: Internal Medicine

## 2013-11-26 ENCOUNTER — Encounter (HOSPITAL_COMMUNITY)
Admission: RE | Admit: 2013-11-26 | Discharge: 2013-11-26 | Disposition: A | Discharge status: Home / Self Care | Payer: Self-pay | Source: Ambulatory Visit | Attending: Internal Medicine | Admitting: Internal Medicine

## 2013-11-28 ENCOUNTER — Encounter (HOSPITAL_COMMUNITY)
Admission: RE | Admit: 2013-11-28 | Discharge: 2013-11-28 | Disposition: A | Discharge status: Home / Self Care | Payer: Self-pay | Source: Ambulatory Visit | Attending: Internal Medicine | Admitting: Internal Medicine

## 2013-11-30 ENCOUNTER — Encounter (HOSPITAL_COMMUNITY)
Admission: RE | Admit: 2013-11-30 | Discharge: 2013-11-30 | Disposition: A | Discharge status: Home / Self Care | Payer: Self-pay | Source: Ambulatory Visit | Attending: Internal Medicine | Admitting: Internal Medicine

## 2013-12-03 ENCOUNTER — Encounter (HOSPITAL_COMMUNITY)
Admission: RE | Admit: 2013-12-03 | Discharge: 2013-12-03 | Disposition: A | Discharge status: Home / Self Care | Payer: Self-pay | Source: Ambulatory Visit | Attending: Internal Medicine | Admitting: Internal Medicine

## 2013-12-05 ENCOUNTER — Encounter (HOSPITAL_COMMUNITY)
Admission: RE | Admit: 2013-12-05 | Discharge: 2013-12-05 | Disposition: A | Discharge status: Home / Self Care | Payer: Self-pay | Source: Ambulatory Visit | Attending: Internal Medicine | Admitting: Internal Medicine

## 2013-12-07 ENCOUNTER — Encounter (HOSPITAL_COMMUNITY)
Admission: RE | Admit: 2013-12-07 | Discharge: 2013-12-07 | Disposition: A | Discharge status: Home / Self Care | Payer: Self-pay | Source: Ambulatory Visit | Attending: Internal Medicine | Admitting: Internal Medicine

## 2013-12-10 ENCOUNTER — Encounter (HOSPITAL_COMMUNITY)
Admission: RE | Admit: 2013-12-10 | Discharge: 2013-12-10 | Disposition: A | Discharge status: Home / Self Care | Payer: Self-pay | Source: Ambulatory Visit | Attending: Internal Medicine | Admitting: Internal Medicine

## 2013-12-12 ENCOUNTER — Encounter: Payer: Self-pay | Admitting: Internal Medicine

## 2013-12-12 ENCOUNTER — Ambulatory Visit (INDEPENDENT_AMBULATORY_CARE_PROVIDER_SITE_OTHER): Payer: Medicare Other | Admitting: *Deleted

## 2013-12-12 ENCOUNTER — Encounter (HOSPITAL_COMMUNITY): Payer: Self-pay

## 2013-12-12 DIAGNOSIS — I495 Sick sinus syndrome: Secondary | ICD-10-CM

## 2013-12-12 LAB — MDC_IDC_ENUM_SESS_TYPE_REMOTE
Battery Remaining Longevity: 109 mo
Battery Remaining Percentage: 77 %
Battery Voltage: 2.96 V
Brady Statistic RV Percent Paced: 99 %
Date Time Interrogation Session: 20151125135230
Implantable Pulse Generator Model: 1110
Implantable Pulse Generator Serial Number: 2574597
Lead Channel Pacing Threshold Amplitude: 0.75 V
Lead Channel Setting Pacing Amplitude: 2.5 V
Lead Channel Setting Pacing Pulse Width: 0.4 ms
Lead Channel Setting Sensing Sensitivity: 4 mV
MDC IDC MSMT LEADCHNL RV IMPEDANCE VALUE: 580 Ohm
MDC IDC MSMT LEADCHNL RV PACING THRESHOLD PULSEWIDTH: 0.4 ms
MDC IDC MSMT LEADCHNL RV SENSING INTR AMPL: 12 mV

## 2013-12-12 NOTE — Progress Notes (Signed)
Remote pacemaker transmission.   

## 2013-12-14 ENCOUNTER — Other Ambulatory Visit: Payer: Self-pay | Admitting: Internal Medicine

## 2013-12-14 ENCOUNTER — Encounter (HOSPITAL_COMMUNITY): Payer: Self-pay

## 2013-12-17 ENCOUNTER — Encounter (HOSPITAL_COMMUNITY)
Admission: RE | Admit: 2013-12-17 | Discharge: 2013-12-17 | Disposition: A | Discharge status: Home / Self Care | Payer: Self-pay | Source: Ambulatory Visit | Attending: Internal Medicine | Admitting: Internal Medicine

## 2013-12-18 ENCOUNTER — Ambulatory Visit (INDEPENDENT_AMBULATORY_CARE_PROVIDER_SITE_OTHER): Payer: Medicare Other | Admitting: Pharmacist

## 2013-12-18 DIAGNOSIS — Z7901 Long term (current) use of anticoagulants: Secondary | ICD-10-CM

## 2013-12-18 DIAGNOSIS — Z5181 Encounter for therapeutic drug level monitoring: Secondary | ICD-10-CM

## 2013-12-18 DIAGNOSIS — I4891 Unspecified atrial fibrillation: Secondary | ICD-10-CM

## 2013-12-18 LAB — POCT INR: INR: 4.3

## 2013-12-19 ENCOUNTER — Encounter (HOSPITAL_COMMUNITY)
Admission: RE | Admit: 2013-12-19 | Discharge: 2013-12-19 | Disposition: A | Discharge status: Home / Self Care | Payer: Self-pay | Source: Ambulatory Visit | Attending: Internal Medicine | Admitting: Internal Medicine

## 2013-12-19 DIAGNOSIS — Z955 Presence of coronary angioplasty implant and graft: Secondary | ICD-10-CM | POA: Insufficient documentation

## 2013-12-19 DIAGNOSIS — Z5189 Encounter for other specified aftercare: Secondary | ICD-10-CM | POA: Insufficient documentation

## 2013-12-19 DIAGNOSIS — I2510 Atherosclerotic heart disease of native coronary artery without angina pectoris: Secondary | ICD-10-CM | POA: Insufficient documentation

## 2013-12-21 ENCOUNTER — Encounter (HOSPITAL_COMMUNITY)
Admission: RE | Admit: 2013-12-21 | Discharge: 2013-12-21 | Disposition: A | Discharge status: Home / Self Care | Payer: Medicare Other | Source: Ambulatory Visit | Attending: Internal Medicine | Admitting: Internal Medicine

## 2013-12-24 ENCOUNTER — Encounter (HOSPITAL_COMMUNITY)
Admission: RE | Admit: 2013-12-24 | Discharge: 2013-12-24 | Disposition: A | Discharge status: Home / Self Care | Payer: Self-pay | Source: Ambulatory Visit | Attending: Internal Medicine | Admitting: Internal Medicine

## 2013-12-26 ENCOUNTER — Encounter (HOSPITAL_COMMUNITY)
Admission: RE | Admit: 2013-12-26 | Discharge: 2013-12-26 | Disposition: A | Discharge status: Home / Self Care | Payer: Self-pay | Source: Ambulatory Visit | Attending: Internal Medicine | Admitting: Internal Medicine

## 2013-12-28 ENCOUNTER — Encounter (HOSPITAL_COMMUNITY)
Admission: RE | Admit: 2013-12-28 | Discharge: 2013-12-28 | Disposition: A | Discharge status: Home / Self Care | Payer: Self-pay | Source: Ambulatory Visit | Attending: Internal Medicine | Admitting: Internal Medicine

## 2013-12-31 ENCOUNTER — Encounter (HOSPITAL_COMMUNITY)
Admission: RE | Admit: 2013-12-31 | Discharge: 2013-12-31 | Disposition: A | Discharge status: Home / Self Care | Payer: Self-pay | Source: Ambulatory Visit | Attending: Internal Medicine | Admitting: Internal Medicine

## 2014-01-01 ENCOUNTER — Ambulatory Visit (INDEPENDENT_AMBULATORY_CARE_PROVIDER_SITE_OTHER): Payer: Medicare Other | Admitting: Pharmacist

## 2014-01-01 DIAGNOSIS — Z5181 Encounter for therapeutic drug level monitoring: Secondary | ICD-10-CM

## 2014-01-01 DIAGNOSIS — I4891 Unspecified atrial fibrillation: Secondary | ICD-10-CM

## 2014-01-01 DIAGNOSIS — Z7901 Long term (current) use of anticoagulants: Secondary | ICD-10-CM

## 2014-01-01 LAB — POCT INR: INR: 2.5

## 2014-01-02 ENCOUNTER — Encounter (HOSPITAL_COMMUNITY)
Admission: RE | Admit: 2014-01-02 | Discharge: 2014-01-02 | Disposition: A | Discharge status: Home / Self Care | Payer: Self-pay | Source: Ambulatory Visit | Attending: Internal Medicine | Admitting: Internal Medicine

## 2014-01-03 ENCOUNTER — Encounter: Payer: Self-pay | Admitting: Cardiology

## 2014-01-04 ENCOUNTER — Encounter (HOSPITAL_COMMUNITY)
Admission: RE | Admit: 2014-01-04 | Discharge: 2014-01-04 | Disposition: A | Discharge status: Home / Self Care | Payer: Self-pay | Source: Ambulatory Visit | Attending: Internal Medicine | Admitting: Internal Medicine

## 2014-01-07 ENCOUNTER — Encounter (HOSPITAL_COMMUNITY): Payer: Self-pay

## 2014-01-09 ENCOUNTER — Encounter (HOSPITAL_COMMUNITY): Payer: Self-pay

## 2014-01-11 ENCOUNTER — Encounter (HOSPITAL_COMMUNITY): Payer: Self-pay

## 2014-01-14 ENCOUNTER — Encounter (HOSPITAL_COMMUNITY): Payer: Self-pay

## 2014-01-16 ENCOUNTER — Encounter (HOSPITAL_COMMUNITY): Payer: Self-pay

## 2014-01-17 ENCOUNTER — Encounter: Payer: Self-pay | Admitting: Cardiology

## 2014-01-18 ENCOUNTER — Encounter (HOSPITAL_COMMUNITY): Payer: Self-pay

## 2014-01-21 ENCOUNTER — Encounter (HOSPITAL_COMMUNITY)
Admission: RE | Admit: 2014-01-21 | Discharge: 2014-01-21 | Disposition: A | Discharge status: Home / Self Care | Payer: Self-pay | Source: Ambulatory Visit | Attending: Internal Medicine | Admitting: Internal Medicine

## 2014-01-21 DIAGNOSIS — I2510 Atherosclerotic heart disease of native coronary artery without angina pectoris: Secondary | ICD-10-CM | POA: Insufficient documentation

## 2014-01-21 DIAGNOSIS — Z5189 Encounter for other specified aftercare: Secondary | ICD-10-CM | POA: Insufficient documentation

## 2014-01-21 DIAGNOSIS — Z955 Presence of coronary angioplasty implant and graft: Secondary | ICD-10-CM | POA: Insufficient documentation

## 2014-01-23 ENCOUNTER — Encounter (HOSPITAL_COMMUNITY)
Admission: RE | Admit: 2014-01-23 | Discharge: 2014-01-23 | Disposition: A | Discharge status: Home / Self Care | Payer: Self-pay | Source: Ambulatory Visit | Attending: Internal Medicine | Admitting: Internal Medicine

## 2014-01-25 ENCOUNTER — Encounter (HOSPITAL_COMMUNITY)
Admission: RE | Admit: 2014-01-25 | Discharge: 2014-01-25 | Disposition: A | Discharge status: Home / Self Care | Payer: Self-pay | Source: Ambulatory Visit | Attending: Internal Medicine | Admitting: Internal Medicine

## 2014-01-28 ENCOUNTER — Encounter (HOSPITAL_COMMUNITY)
Admission: RE | Admit: 2014-01-28 | Discharge: 2014-01-28 | Disposition: A | Discharge status: Home / Self Care | Payer: Self-pay | Source: Ambulatory Visit | Attending: Internal Medicine | Admitting: Internal Medicine

## 2014-01-29 ENCOUNTER — Ambulatory Visit (INDEPENDENT_AMBULATORY_CARE_PROVIDER_SITE_OTHER): Payer: 59 | Admitting: *Deleted

## 2014-01-29 DIAGNOSIS — Z7901 Long term (current) use of anticoagulants: Secondary | ICD-10-CM

## 2014-01-29 DIAGNOSIS — Z5181 Encounter for therapeutic drug level monitoring: Secondary | ICD-10-CM

## 2014-01-29 DIAGNOSIS — I4891 Unspecified atrial fibrillation: Secondary | ICD-10-CM

## 2014-01-29 LAB — POCT INR: INR: 1.9

## 2014-01-30 ENCOUNTER — Encounter (HOSPITAL_COMMUNITY)
Admission: RE | Admit: 2014-01-30 | Discharge: 2014-01-30 | Disposition: A | Discharge status: Home / Self Care | Payer: Self-pay | Source: Ambulatory Visit | Attending: Internal Medicine | Admitting: Internal Medicine

## 2014-02-01 ENCOUNTER — Encounter (HOSPITAL_COMMUNITY)
Admission: RE | Admit: 2014-02-01 | Discharge: 2014-02-01 | Disposition: A | Discharge status: Home / Self Care | Payer: Self-pay | Source: Ambulatory Visit | Attending: Internal Medicine | Admitting: Internal Medicine

## 2014-02-04 ENCOUNTER — Encounter (HOSPITAL_COMMUNITY): Payer: Self-pay

## 2014-02-06 ENCOUNTER — Encounter (HOSPITAL_COMMUNITY)
Admission: RE | Admit: 2014-02-06 | Discharge: 2014-02-06 | Disposition: A | Discharge status: Home / Self Care | Payer: Self-pay | Source: Ambulatory Visit | Attending: Internal Medicine | Admitting: Internal Medicine

## 2014-02-08 ENCOUNTER — Encounter (HOSPITAL_COMMUNITY): Payer: Self-pay

## 2014-02-11 ENCOUNTER — Encounter (HOSPITAL_COMMUNITY): Payer: Self-pay

## 2014-02-12 ENCOUNTER — Other Ambulatory Visit: Payer: Self-pay | Admitting: Internal Medicine

## 2014-02-13 ENCOUNTER — Encounter (HOSPITAL_COMMUNITY)
Admission: RE | Admit: 2014-02-13 | Discharge: 2014-02-13 | Disposition: A | Discharge status: Home / Self Care | Payer: Self-pay | Source: Ambulatory Visit | Attending: Internal Medicine | Admitting: Internal Medicine

## 2014-02-15 ENCOUNTER — Encounter (HOSPITAL_COMMUNITY)
Admission: RE | Admit: 2014-02-15 | Discharge: 2014-02-15 | Disposition: A | Discharge status: Home / Self Care | Payer: Self-pay | Source: Ambulatory Visit | Attending: Internal Medicine | Admitting: Internal Medicine

## 2014-02-18 ENCOUNTER — Encounter (HOSPITAL_COMMUNITY)
Admission: RE | Admit: 2014-02-18 | Discharge: 2014-02-18 | Disposition: A | Discharge status: Home / Self Care | Payer: Self-pay | Source: Ambulatory Visit | Attending: Internal Medicine | Admitting: Internal Medicine

## 2014-02-18 DIAGNOSIS — Z5189 Encounter for other specified aftercare: Secondary | ICD-10-CM | POA: Insufficient documentation

## 2014-02-18 DIAGNOSIS — I2510 Atherosclerotic heart disease of native coronary artery without angina pectoris: Secondary | ICD-10-CM | POA: Insufficient documentation

## 2014-02-18 DIAGNOSIS — Z955 Presence of coronary angioplasty implant and graft: Secondary | ICD-10-CM | POA: Insufficient documentation

## 2014-02-19 ENCOUNTER — Ambulatory Visit (INDEPENDENT_AMBULATORY_CARE_PROVIDER_SITE_OTHER): Payer: Medicare Other | Admitting: *Deleted

## 2014-02-19 DIAGNOSIS — I4891 Unspecified atrial fibrillation: Secondary | ICD-10-CM

## 2014-02-19 DIAGNOSIS — Z7901 Long term (current) use of anticoagulants: Secondary | ICD-10-CM

## 2014-02-19 DIAGNOSIS — Z5181 Encounter for therapeutic drug level monitoring: Secondary | ICD-10-CM

## 2014-02-19 LAB — POCT INR: INR: 2.8

## 2014-02-20 ENCOUNTER — Encounter (HOSPITAL_COMMUNITY)
Admission: RE | Admit: 2014-02-20 | Discharge: 2014-02-20 | Disposition: A | Discharge status: Home / Self Care | Payer: Self-pay | Source: Ambulatory Visit | Attending: Internal Medicine | Admitting: Internal Medicine

## 2014-02-22 ENCOUNTER — Encounter (HOSPITAL_COMMUNITY)
Admission: RE | Admit: 2014-02-22 | Discharge: 2014-02-22 | Disposition: A | Discharge status: Home / Self Care | Payer: Self-pay | Source: Ambulatory Visit | Attending: Internal Medicine | Admitting: Internal Medicine

## 2014-02-25 ENCOUNTER — Encounter (HOSPITAL_COMMUNITY)
Admission: RE | Admit: 2014-02-25 | Discharge: 2014-02-25 | Disposition: A | Discharge status: Home / Self Care | Payer: Self-pay | Source: Ambulatory Visit | Attending: Internal Medicine | Admitting: Internal Medicine

## 2014-02-25 ENCOUNTER — Other Ambulatory Visit: Payer: Self-pay | Admitting: Internal Medicine

## 2014-02-27 ENCOUNTER — Encounter (HOSPITAL_COMMUNITY)
Admission: RE | Admit: 2014-02-27 | Discharge: 2014-02-27 | Disposition: A | Discharge status: Home / Self Care | Payer: Self-pay | Source: Ambulatory Visit | Attending: Internal Medicine | Admitting: Internal Medicine

## 2014-03-01 ENCOUNTER — Encounter (HOSPITAL_COMMUNITY)
Admission: RE | Admit: 2014-03-01 | Discharge: 2014-03-01 | Disposition: A | Discharge status: Home / Self Care | Payer: Self-pay | Source: Ambulatory Visit | Attending: Internal Medicine | Admitting: Internal Medicine

## 2014-03-04 ENCOUNTER — Encounter (HOSPITAL_COMMUNITY): Payer: Self-pay

## 2014-03-06 ENCOUNTER — Encounter (HOSPITAL_COMMUNITY)
Admission: RE | Admit: 2014-03-06 | Discharge: 2014-03-06 | Disposition: A | Discharge status: Home / Self Care | Payer: Self-pay | Source: Ambulatory Visit | Attending: Internal Medicine | Admitting: Internal Medicine

## 2014-03-08 ENCOUNTER — Encounter (HOSPITAL_COMMUNITY)
Admission: RE | Admit: 2014-03-08 | Discharge: 2014-03-08 | Disposition: A | Discharge status: Home / Self Care | Payer: Self-pay | Source: Ambulatory Visit | Attending: Internal Medicine | Admitting: Internal Medicine

## 2014-03-08 ENCOUNTER — Other Ambulatory Visit: Payer: Self-pay | Admitting: Internal Medicine

## 2014-03-11 ENCOUNTER — Encounter (HOSPITAL_COMMUNITY): Payer: Self-pay

## 2014-03-13 ENCOUNTER — Encounter (HOSPITAL_COMMUNITY)
Admission: RE | Admit: 2014-03-13 | Discharge: 2014-03-13 | Disposition: A | Discharge status: Home / Self Care | Payer: Self-pay | Source: Ambulatory Visit | Attending: Internal Medicine | Admitting: Internal Medicine

## 2014-03-15 ENCOUNTER — Encounter (HOSPITAL_COMMUNITY)
Admission: RE | Admit: 2014-03-15 | Discharge: 2014-03-15 | Disposition: A | Discharge status: Home / Self Care | Payer: Self-pay | Source: Ambulatory Visit | Attending: Internal Medicine | Admitting: Internal Medicine

## 2014-03-18 ENCOUNTER — Encounter (HOSPITAL_COMMUNITY)
Admission: RE | Admit: 2014-03-18 | Discharge: 2014-03-18 | Disposition: A | Discharge status: Home / Self Care | Payer: Self-pay | Source: Ambulatory Visit | Attending: Internal Medicine | Admitting: Internal Medicine

## 2014-03-18 ENCOUNTER — Ambulatory Visit (INDEPENDENT_AMBULATORY_CARE_PROVIDER_SITE_OTHER): Payer: Medicare Other | Admitting: *Deleted

## 2014-03-18 DIAGNOSIS — I495 Sick sinus syndrome: Secondary | ICD-10-CM

## 2014-03-18 NOTE — Progress Notes (Signed)
Remote pacemaker transmission.   

## 2014-03-19 ENCOUNTER — Ambulatory Visit (INDEPENDENT_AMBULATORY_CARE_PROVIDER_SITE_OTHER): Payer: Medicare Other | Admitting: Pharmacist

## 2014-03-19 ENCOUNTER — Telehealth: Payer: Self-pay | Admitting: Internal Medicine

## 2014-03-19 DIAGNOSIS — Z5181 Encounter for therapeutic drug level monitoring: Secondary | ICD-10-CM

## 2014-03-19 DIAGNOSIS — I4891 Unspecified atrial fibrillation: Secondary | ICD-10-CM

## 2014-03-19 DIAGNOSIS — Z7901 Long term (current) use of anticoagulants: Secondary | ICD-10-CM

## 2014-03-19 LAB — POCT INR: INR: 1.9

## 2014-03-19 NOTE — Telephone Encounter (Signed)
New message      Request for surgical clearance:  1. What type of surgery is being performed? extraction  2. When is this surgery scheduled? 03-22-14  Are there any medications that need to be held prior to surgery and how long? Coumadin--inr is 1.9.  OK to keep pt on coumadin if ok with us---just need something in writing 3. Name of physician performing surgery? Dr Romie Minus  4. What is your office phone and fax number? Fax [phone]

## 2014-03-20 ENCOUNTER — Encounter (HOSPITAL_COMMUNITY)
Admission: RE | Admit: 2014-03-20 | Discharge: 2014-03-20 | Disposition: A | Discharge status: Home / Self Care | Payer: Self-pay | Source: Ambulatory Visit | Attending: Internal Medicine | Admitting: Internal Medicine

## 2014-03-20 DIAGNOSIS — I2510 Atherosclerotic heart disease of native coronary artery without angina pectoris: Secondary | ICD-10-CM | POA: Insufficient documentation

## 2014-03-20 DIAGNOSIS — Z5189 Encounter for other specified aftercare: Secondary | ICD-10-CM | POA: Insufficient documentation

## 2014-03-20 DIAGNOSIS — Z955 Presence of coronary angioplasty implant and graft: Secondary | ICD-10-CM | POA: Insufficient documentation

## 2014-03-20 NOTE — Telephone Encounter (Signed)
Discussed with Dr Lovena Le may proceed with extractions on Coumadin

## 2014-03-22 ENCOUNTER — Encounter (HOSPITAL_COMMUNITY): Payer: Self-pay

## 2014-03-24 LAB — MDC_IDC_ENUM_SESS_TYPE_REMOTE
Battery Remaining Percentage: 91 %
Battery Voltage: 2.98 V
Brady Statistic RV Percent Paced: 99 %
Date Time Interrogation Session: 20160229124856
Implantable Pulse Generator Model: 1110
Implantable Pulse Generator Serial Number: 2574597
Lead Channel Impedance Value: 560 Ohm
Lead Channel Pacing Threshold Amplitude: 0.75 V
Lead Channel Setting Pacing Amplitude: 2.5 V
Lead Channel Setting Pacing Pulse Width: 0.4 ms
MDC IDC MSMT BATTERY REMAINING LONGEVITY: 128 mo
MDC IDC MSMT LEADCHNL RV PACING THRESHOLD PULSEWIDTH: 0.4 ms
MDC IDC MSMT LEADCHNL RV SENSING INTR AMPL: 9.2 mV
MDC IDC SET LEADCHNL RV SENSING SENSITIVITY: 4 mV

## 2014-03-25 ENCOUNTER — Encounter (HOSPITAL_COMMUNITY)
Admission: RE | Admit: 2014-03-25 | Discharge: 2014-03-25 | Disposition: A | Discharge status: Home / Self Care | Payer: Self-pay | Source: Ambulatory Visit | Attending: Internal Medicine | Admitting: Internal Medicine

## 2014-03-27 ENCOUNTER — Encounter (HOSPITAL_COMMUNITY)
Admission: RE | Admit: 2014-03-27 | Discharge: 2014-03-27 | Disposition: A | Discharge status: Home / Self Care | Payer: Self-pay | Source: Ambulatory Visit | Attending: Internal Medicine | Admitting: Internal Medicine

## 2014-03-29 ENCOUNTER — Encounter (HOSPITAL_COMMUNITY)
Admission: RE | Admit: 2014-03-29 | Discharge: 2014-03-29 | Disposition: A | Discharge status: Home / Self Care | Payer: Self-pay | Source: Ambulatory Visit | Attending: Internal Medicine | Admitting: Internal Medicine

## 2014-04-01 ENCOUNTER — Encounter (HOSPITAL_COMMUNITY)
Admission: RE | Admit: 2014-04-01 | Discharge: 2014-04-01 | Disposition: A | Discharge status: Home / Self Care | Payer: Self-pay | Source: Ambulatory Visit | Attending: Internal Medicine | Admitting: Internal Medicine

## 2014-04-03 ENCOUNTER — Encounter (HOSPITAL_COMMUNITY)
Admission: RE | Admit: 2014-04-03 | Discharge: 2014-04-03 | Disposition: A | Discharge status: Home / Self Care | Payer: Self-pay | Source: Ambulatory Visit | Attending: Internal Medicine | Admitting: Internal Medicine

## 2014-04-04 ENCOUNTER — Encounter: Payer: Self-pay | Admitting: Cardiology

## 2014-04-05 ENCOUNTER — Encounter (HOSPITAL_COMMUNITY)
Admission: RE | Admit: 2014-04-05 | Discharge: 2014-04-05 | Disposition: A | Discharge status: Home / Self Care | Payer: Self-pay | Source: Ambulatory Visit | Attending: Internal Medicine | Admitting: Internal Medicine

## 2014-04-08 ENCOUNTER — Encounter (HOSPITAL_COMMUNITY): Payer: Self-pay

## 2014-04-09 ENCOUNTER — Ambulatory Visit (INDEPENDENT_AMBULATORY_CARE_PROVIDER_SITE_OTHER): Payer: Medicare Other

## 2014-04-09 DIAGNOSIS — I4891 Unspecified atrial fibrillation: Secondary | ICD-10-CM

## 2014-04-09 DIAGNOSIS — Z7901 Long term (current) use of anticoagulants: Secondary | ICD-10-CM | POA: Diagnosis not present

## 2014-04-09 DIAGNOSIS — Z5181 Encounter for therapeutic drug level monitoring: Secondary | ICD-10-CM | POA: Diagnosis not present

## 2014-04-09 LAB — POCT INR: INR: 3

## 2014-04-10 ENCOUNTER — Encounter: Payer: Self-pay | Admitting: Internal Medicine

## 2014-04-10 ENCOUNTER — Encounter (HOSPITAL_COMMUNITY): Payer: Self-pay

## 2014-04-12 ENCOUNTER — Encounter (HOSPITAL_COMMUNITY): Payer: Self-pay

## 2014-04-15 ENCOUNTER — Encounter (HOSPITAL_COMMUNITY): Payer: Self-pay

## 2014-04-17 ENCOUNTER — Encounter (HOSPITAL_COMMUNITY): Payer: Self-pay

## 2014-04-18 ENCOUNTER — Encounter: Payer: Self-pay | Admitting: Cardiology

## 2014-04-19 ENCOUNTER — Encounter (HOSPITAL_COMMUNITY): Payer: Self-pay

## 2014-04-19 DIAGNOSIS — Z5189 Encounter for other specified aftercare: Secondary | ICD-10-CM | POA: Insufficient documentation

## 2014-04-19 DIAGNOSIS — Z955 Presence of coronary angioplasty implant and graft: Secondary | ICD-10-CM | POA: Insufficient documentation

## 2014-04-19 DIAGNOSIS — I2510 Atherosclerotic heart disease of native coronary artery without angina pectoris: Secondary | ICD-10-CM | POA: Insufficient documentation

## 2014-04-22 ENCOUNTER — Encounter (HOSPITAL_COMMUNITY): Payer: Self-pay

## 2014-04-24 ENCOUNTER — Encounter (HOSPITAL_COMMUNITY)
Admission: RE | Admit: 2014-04-24 | Discharge: 2014-04-24 | Disposition: A | Discharge status: Home / Self Care | Payer: Self-pay | Source: Ambulatory Visit | Attending: Internal Medicine | Admitting: Internal Medicine

## 2014-04-26 ENCOUNTER — Encounter (HOSPITAL_COMMUNITY)
Admission: RE | Admit: 2014-04-26 | Discharge: 2014-04-26 | Disposition: A | Discharge status: Home / Self Care | Payer: Self-pay | Source: Ambulatory Visit | Attending: Internal Medicine | Admitting: Internal Medicine

## 2014-05-01 ENCOUNTER — Encounter (HOSPITAL_COMMUNITY)
Admission: RE | Admit: 2014-05-01 | Discharge: 2014-05-01 | Disposition: A | Discharge status: Home / Self Care | Payer: Self-pay | Source: Ambulatory Visit | Attending: Internal Medicine | Admitting: Internal Medicine

## 2014-05-03 ENCOUNTER — Encounter (HOSPITAL_COMMUNITY)
Admission: RE | Admit: 2014-05-03 | Discharge: 2014-05-03 | Disposition: A | Discharge status: Home / Self Care | Payer: Self-pay | Source: Ambulatory Visit | Attending: Internal Medicine | Admitting: Internal Medicine

## 2014-05-06 ENCOUNTER — Encounter (HOSPITAL_COMMUNITY)
Admission: RE | Admit: 2014-05-06 | Discharge: 2014-05-06 | Disposition: A | Discharge status: Home / Self Care | Payer: Self-pay | Source: Ambulatory Visit | Attending: Internal Medicine | Admitting: Internal Medicine

## 2014-05-07 ENCOUNTER — Ambulatory Visit (INDEPENDENT_AMBULATORY_CARE_PROVIDER_SITE_OTHER): Payer: Medicare Other

## 2014-05-07 DIAGNOSIS — I4891 Unspecified atrial fibrillation: Secondary | ICD-10-CM | POA: Diagnosis not present

## 2014-05-07 DIAGNOSIS — Z7901 Long term (current) use of anticoagulants: Secondary | ICD-10-CM | POA: Diagnosis not present

## 2014-05-07 DIAGNOSIS — Z5181 Encounter for therapeutic drug level monitoring: Secondary | ICD-10-CM

## 2014-05-07 LAB — POCT INR: INR: 2.5

## 2014-05-08 ENCOUNTER — Encounter (HOSPITAL_COMMUNITY)
Admission: RE | Admit: 2014-05-08 | Discharge: 2014-05-08 | Disposition: A | Discharge status: Home / Self Care | Payer: Self-pay | Source: Ambulatory Visit | Attending: Internal Medicine | Admitting: Internal Medicine

## 2014-05-10 ENCOUNTER — Encounter (HOSPITAL_COMMUNITY)
Admission: RE | Admit: 2014-05-10 | Discharge: 2014-05-10 | Disposition: A | Discharge status: Home / Self Care | Payer: Self-pay | Source: Ambulatory Visit | Attending: Internal Medicine | Admitting: Internal Medicine

## 2014-05-20 ENCOUNTER — Encounter (HOSPITAL_COMMUNITY)
Admission: RE | Admit: 2014-05-20 | Discharge: 2014-05-20 | Disposition: A | Discharge status: Home / Self Care | Payer: Self-pay | Source: Ambulatory Visit | Attending: Internal Medicine | Admitting: Internal Medicine

## 2014-05-20 DIAGNOSIS — Z955 Presence of coronary angioplasty implant and graft: Secondary | ICD-10-CM | POA: Insufficient documentation

## 2014-05-20 DIAGNOSIS — Z5189 Encounter for other specified aftercare: Secondary | ICD-10-CM | POA: Insufficient documentation

## 2014-05-20 DIAGNOSIS — I2510 Atherosclerotic heart disease of native coronary artery without angina pectoris: Secondary | ICD-10-CM | POA: Insufficient documentation

## 2014-05-22 ENCOUNTER — Encounter (HOSPITAL_COMMUNITY)
Admission: RE | Admit: 2014-05-22 | Discharge: 2014-05-22 | Disposition: A | Discharge status: Home / Self Care | Payer: Self-pay | Source: Ambulatory Visit | Attending: Internal Medicine | Admitting: Internal Medicine

## 2014-05-22 ENCOUNTER — Other Ambulatory Visit: Payer: Self-pay | Admitting: Internal Medicine

## 2014-05-24 ENCOUNTER — Encounter (HOSPITAL_COMMUNITY)
Admission: RE | Admit: 2014-05-24 | Discharge: 2014-05-24 | Disposition: A | Discharge status: Home / Self Care | Payer: Self-pay | Source: Ambulatory Visit | Attending: Internal Medicine | Admitting: Internal Medicine

## 2014-05-27 ENCOUNTER — Encounter (HOSPITAL_COMMUNITY)
Admission: RE | Admit: 2014-05-27 | Discharge: 2014-05-27 | Disposition: A | Discharge status: Home / Self Care | Payer: Self-pay | Source: Ambulatory Visit | Attending: Internal Medicine | Admitting: Internal Medicine

## 2014-05-29 ENCOUNTER — Encounter (HOSPITAL_COMMUNITY): Payer: Self-pay

## 2014-05-31 ENCOUNTER — Encounter (HOSPITAL_COMMUNITY)
Admission: RE | Admit: 2014-05-31 | Discharge: 2014-05-31 | Disposition: A | Discharge status: Home / Self Care | Payer: Self-pay | Source: Ambulatory Visit | Attending: Internal Medicine | Admitting: Internal Medicine

## 2014-06-03 ENCOUNTER — Encounter (HOSPITAL_COMMUNITY)
Admission: RE | Admit: 2014-06-03 | Discharge: 2014-06-03 | Disposition: A | Discharge status: Home / Self Care | Payer: Self-pay | Source: Ambulatory Visit | Attending: Internal Medicine | Admitting: Internal Medicine

## 2014-06-04 ENCOUNTER — Encounter: Payer: Self-pay | Admitting: Internal Medicine

## 2014-06-04 ENCOUNTER — Ambulatory Visit (INDEPENDENT_AMBULATORY_CARE_PROVIDER_SITE_OTHER): Payer: Medicare Other | Admitting: Pharmacist Clinician (PhC)/ Clinical Pharmacy Specialist

## 2014-06-04 ENCOUNTER — Ambulatory Visit (INDEPENDENT_AMBULATORY_CARE_PROVIDER_SITE_OTHER): Payer: Medicare Other | Admitting: Internal Medicine

## 2014-06-04 VITALS — BP 149/74 | HR 60 | Ht 69.0 in | Wt 163.0 lb

## 2014-06-04 DIAGNOSIS — I4891 Unspecified atrial fibrillation: Secondary | ICD-10-CM

## 2014-06-04 DIAGNOSIS — I5030 Unspecified diastolic (congestive) heart failure: Secondary | ICD-10-CM | POA: Diagnosis not present

## 2014-06-04 DIAGNOSIS — Z950 Presence of cardiac pacemaker: Secondary | ICD-10-CM | POA: Diagnosis not present

## 2014-06-04 DIAGNOSIS — I10 Essential (primary) hypertension: Secondary | ICD-10-CM | POA: Diagnosis not present

## 2014-06-04 DIAGNOSIS — Z5181 Encounter for therapeutic drug level monitoring: Secondary | ICD-10-CM

## 2014-06-04 DIAGNOSIS — Z7901 Long term (current) use of anticoagulants: Secondary | ICD-10-CM

## 2014-06-04 LAB — CUP PACEART INCLINIC DEVICE CHECK
Battery Remaining Longevity: 139.2 mo
Lead Channel Impedance Value: 575 Ohm
Lead Channel Pacing Threshold Amplitude: 1 V
Lead Channel Setting Pacing Amplitude: 2.5 V
Lead Channel Setting Sensing Sensitivity: 4 mV
MDC IDC MSMT BATTERY VOLTAGE: 2.98 V
MDC IDC MSMT LEADCHNL RV PACING THRESHOLD PULSEWIDTH: 0.4 ms
MDC IDC PG SERIAL: 2574597
MDC IDC SESS DTM: 20160517120401
MDC IDC SET LEADCHNL RV PACING PULSEWIDTH: 0.4 ms
MDC IDC STAT BRADY RV PERCENT PACED: 99 %
Pulse Gen Model: 1110

## 2014-06-04 LAB — POCT INR: INR: 2.3

## 2014-06-04 NOTE — Assessment & Plan Note (Signed)
His St. Jude single chamber PPM is working normally. Will follow and recheck in

## 2014-06-04 NOTE — Progress Notes (Signed)
HPI Oscar Bruce returns today for followup. He is a very pleasant 81 year old man with a history of symptomatic bradycardia, chronic atrial fibrillation, complete heart block, status post pacemaker insertion. His peripheral edema has improved with the use of tight stockings. He admits to sodium indiscretion. He has had problems with cellulitis which have resolved. He is participating in cardiac rehab and doing well. Allergies  Allergen Reactions  . Penicillins Swelling     Current Outpatient Prescriptions  Medication Sig Dispense Refill  . acetaminophen (TYLENOL) 500 MG chewable tablet Chew 500 mg by mouth every 6 (six) hours as needed. For pain    . atorvastatin (LIPITOR) 40 MG tablet Take 20 mg by mouth daily.     . Calcium Carbonate Antacid (MAALOX) 600 MG chewable tablet Chew 600 mg by mouth as needed.    . cloNIDine (CATAPRES) 0.2 MG tablet TAKE 2 TABLETS BY MOUTH THREE TIMES DAILY 180 tablet 0  . COUMADIN 5 MG tablet TAKE AS DIRECTED BY COUMADIN CLINIC 35 tablet 3  . cyanocobalamin 100 MCG tablet Take 100 mcg by mouth daily.    . digoxin (LANOXIN) 0.25 MG tablet Take 0.125 mcg by mouth daily.     . Exenatide (BYETTA 5 MCG PEN [place]) Inject 5 mg into the skin 2 (two) times daily.     . ferrous sulfate 325 (65 FE) MG EC tablet Take 325 mg by mouth daily.     . furosemide (LASIX) 40 MG tablet TAKE 1 AND 1/2 TABLET BY MOUTH ONCE DAILY 45 tablet 3  . insulin glargine (LANTUS) 100 UNIT/ML injection Inject 25 Units into the skin at bedtime.     . metoprolol (TOPROL-XL) 50 MG 24 hr tablet Take 50 mg by mouth daily.      . nitroGLYCERIN (NITROSTAT) 0.4 MG SL tablet Place 0.4 mg under the tongue every 5 (five) minutes as needed.      . ramipril (ALTACE) 5 MG capsule Take 5 mg by mouth daily.       No current facility-administered medications for this visit.     Past Medical History  Diagnosis Date  . A-fib   . Hypertension   . CAD (coronary artery disease)   . Diabetes mellitus      ROS:   All systems reviewed and negative except as noted in the HPI.   Past Surgical History  Procedure Laterality Date  . Cholecystectomy    . Coronary artery bypass graft    . Pacemaker insertion    . Cardiac catheterization  2001, 2004  . Cataract surgery  2000     Family History  Problem Relation Age of Onset  . Diabetes Mother   . Coronary artery disease Father   . Diabetes Sister      History   Social History  . Marital Status: Widowed    Spouse Name: N/A  . Number of Children: N/A  . Years of Education: N/A   Occupational History  . Not on file.   Social History Main Topics  . Smoking status: Never Smoker   . Smokeless tobacco: Not on file  . Alcohol Use: No  . Drug Use: No  . Sexual Activity: Not on file   Other Topics Concern  . Not on file   Social History Narrative     BP 149/74 mmHg  Pulse 60  Ht 5\' 9"  (1.753 m)  Wt 163 lb (73.936 kg)  BMI 24.06 kg/m2  Physical Exam:  elderly appearing 81 year old man,NAD HEENT: Unremarkable Neck:  6 cm JVD, no thyromegally Back:  No CVA tenderness Lungs:  Clear with no wheezes, rales, or rhonchi. HEART:  Regular rate rhythm, no murmurs, no rubs, no clicks, soft S4 gallop, questionable right-sided Abd:  soft, positive bowel sounds, no organomegally, no rebound, no guarding Ext:  2 plus pulses, trace peripheral edema, no cyanosis, no clubbing Skin:  No rashes no nodules Neuro:  CN II through XII intact, motor grossly intact  DEVICE  Normal device function.  See PaceArt for details.   Assess/Plan:

## 2014-06-04 NOTE — Assessment & Plan Note (Signed)
His blood pressure is elevated today. He is encouraged to continue his current meds and maintain a low sodium diet.

## 2014-06-04 NOTE — Patient Instructions (Signed)
Medication Instructions:  Your physician recommends that you continue on your current medications as directed. Please refer to the Current Medication list given to you today.   Labwork: None ordered  Testing/Procedures: None ordered  Follow-Up: Your physician wants you to follow-up in: 12 months with Dr Knox Saliva will receive a reminder letter in the mail two months in advance. If you don't receive a letter, please call our office to schedule the follow-up appointment.  Remote monitoring is used to monitor your Pacemaker o ICD from home. This monitoring reduces the number of office visits required to check your device to one time per year. It allows [place] to keep an eye on the functioning of your device to ensure it is working properly. You are scheduled for a device check from home on 09/03/14. You may send your transmission at any time that day. If you have a wireless device, the transmission will be sent automatically. After your physician reviews your transmission, you will receive a postcard with your next transmission date.    Any Other Special Instructions Will Be Listed Below (If Applicable).

## 2014-06-04 NOTE — Assessment & Plan Note (Signed)
His symptoms are class 2 but he has had trouble walking. Will get him a handicap placard. He will continue his current meds and maintain a low sodium diet.

## 2014-06-04 NOTE — Assessment & Plan Note (Signed)
His ventricular rate is well controlled. He will continue his current meds. 

## 2014-06-05 ENCOUNTER — Encounter (HOSPITAL_COMMUNITY)
Admission: RE | Admit: 2014-06-05 | Discharge: 2014-06-05 | Disposition: A | Discharge status: Home / Self Care | Payer: Self-pay | Source: Ambulatory Visit | Attending: Internal Medicine | Admitting: Internal Medicine

## 2014-06-07 ENCOUNTER — Encounter (HOSPITAL_COMMUNITY)
Admission: RE | Admit: 2014-06-07 | Discharge: 2014-06-07 | Disposition: A | Discharge status: Home / Self Care | Payer: Self-pay | Source: Ambulatory Visit | Attending: Internal Medicine | Admitting: Internal Medicine

## 2014-06-10 ENCOUNTER — Encounter (HOSPITAL_COMMUNITY): Payer: Self-pay

## 2014-06-12 ENCOUNTER — Encounter (HOSPITAL_COMMUNITY)
Admission: RE | Admit: 2014-06-12 | Discharge: 2014-06-12 | Disposition: A | Discharge status: Home / Self Care | Payer: Self-pay | Source: Ambulatory Visit | Attending: Internal Medicine | Admitting: Internal Medicine

## 2014-06-14 ENCOUNTER — Encounter (HOSPITAL_COMMUNITY)
Admission: RE | Admit: 2014-06-14 | Discharge: 2014-06-14 | Disposition: A | Discharge status: Home / Self Care | Payer: Self-pay | Source: Ambulatory Visit | Attending: Internal Medicine | Admitting: Internal Medicine

## 2014-06-19 ENCOUNTER — Encounter (HOSPITAL_COMMUNITY): Payer: Self-pay

## 2014-06-19 DIAGNOSIS — Z955 Presence of coronary angioplasty implant and graft: Secondary | ICD-10-CM | POA: Insufficient documentation

## 2014-06-19 DIAGNOSIS — Z5189 Encounter for other specified aftercare: Secondary | ICD-10-CM | POA: Insufficient documentation

## 2014-06-19 DIAGNOSIS — I2510 Atherosclerotic heart disease of native coronary artery without angina pectoris: Secondary | ICD-10-CM | POA: Insufficient documentation

## 2014-06-21 ENCOUNTER — Encounter (HOSPITAL_COMMUNITY)
Admission: RE | Admit: 2014-06-21 | Discharge: 2014-06-21 | Disposition: A | Discharge status: Home / Self Care | Payer: Self-pay | Source: Ambulatory Visit | Attending: Internal Medicine | Admitting: Internal Medicine

## 2014-06-24 ENCOUNTER — Encounter (HOSPITAL_COMMUNITY)
Admission: RE | Admit: 2014-06-24 | Discharge: 2014-06-24 | Disposition: A | Discharge status: Home / Self Care | Payer: Self-pay | Source: Ambulatory Visit | Attending: Internal Medicine | Admitting: Internal Medicine

## 2014-06-26 ENCOUNTER — Encounter (HOSPITAL_COMMUNITY)
Admission: RE | Admit: 2014-06-26 | Discharge: 2014-06-26 | Disposition: A | Discharge status: Home / Self Care | Payer: Self-pay | Source: Ambulatory Visit | Attending: Internal Medicine | Admitting: Internal Medicine

## 2014-06-28 ENCOUNTER — Encounter (HOSPITAL_COMMUNITY)
Admission: RE | Admit: 2014-06-28 | Discharge: 2014-06-28 | Disposition: A | Discharge status: Home / Self Care | Payer: Self-pay | Source: Ambulatory Visit | Attending: Internal Medicine | Admitting: Internal Medicine

## 2014-07-01 ENCOUNTER — Encounter (HOSPITAL_COMMUNITY)
Admission: RE | Admit: 2014-07-01 | Discharge: 2014-07-01 | Disposition: A | Discharge status: Home / Self Care | Payer: Self-pay | Source: Ambulatory Visit | Attending: Internal Medicine | Admitting: Internal Medicine

## 2014-07-02 ENCOUNTER — Ambulatory Visit (INDEPENDENT_AMBULATORY_CARE_PROVIDER_SITE_OTHER): Payer: Medicare Other

## 2014-07-02 DIAGNOSIS — I4891 Unspecified atrial fibrillation: Secondary | ICD-10-CM | POA: Diagnosis not present

## 2014-07-02 DIAGNOSIS — Z5181 Encounter for therapeutic drug level monitoring: Secondary | ICD-10-CM

## 2014-07-02 DIAGNOSIS — Z7901 Long term (current) use of anticoagulants: Secondary | ICD-10-CM | POA: Diagnosis not present

## 2014-07-02 LAB — POCT INR: INR: 4.2

## 2014-07-03 ENCOUNTER — Encounter (HOSPITAL_COMMUNITY)
Admission: RE | Admit: 2014-07-03 | Discharge: 2014-07-03 | Disposition: A | Discharge status: Home / Self Care | Payer: Self-pay | Source: Ambulatory Visit | Attending: Internal Medicine | Admitting: Internal Medicine

## 2014-07-05 ENCOUNTER — Encounter (HOSPITAL_COMMUNITY)
Admission: RE | Admit: 2014-07-05 | Discharge: 2014-07-05 | Disposition: A | Discharge status: Home / Self Care | Payer: Self-pay | Source: Ambulatory Visit | Attending: Internal Medicine | Admitting: Internal Medicine

## 2014-07-08 ENCOUNTER — Encounter (HOSPITAL_COMMUNITY)
Admission: RE | Admit: 2014-07-08 | Discharge: 2014-07-08 | Disposition: A | Discharge status: Home / Self Care | Payer: Self-pay | Source: Ambulatory Visit | Attending: Internal Medicine | Admitting: Internal Medicine

## 2014-07-10 ENCOUNTER — Encounter (HOSPITAL_COMMUNITY)
Admission: RE | Admit: 2014-07-10 | Discharge: 2014-07-10 | Disposition: A | Discharge status: Home / Self Care | Payer: Self-pay | Source: Ambulatory Visit | Attending: Internal Medicine | Admitting: Internal Medicine

## 2014-07-12 ENCOUNTER — Encounter (HOSPITAL_COMMUNITY): Payer: Self-pay

## 2014-07-15 ENCOUNTER — Other Ambulatory Visit: Payer: Self-pay

## 2014-07-15 ENCOUNTER — Encounter (HOSPITAL_COMMUNITY)
Admission: RE | Admit: 2014-07-15 | Discharge: 2014-07-15 | Disposition: A | Discharge status: Home / Self Care | Payer: Self-pay | Source: Ambulatory Visit | Attending: Internal Medicine | Admitting: Internal Medicine

## 2014-07-17 ENCOUNTER — Encounter (HOSPITAL_COMMUNITY)
Admission: RE | Admit: 2014-07-17 | Discharge: 2014-07-17 | Disposition: A | Discharge status: Home / Self Care | Payer: Self-pay | Source: Ambulatory Visit | Attending: Internal Medicine | Admitting: Internal Medicine

## 2014-07-19 ENCOUNTER — Encounter (HOSPITAL_COMMUNITY)
Admission: RE | Admit: 2014-07-19 | Discharge: 2014-07-19 | Disposition: A | Discharge status: Home / Self Care | Payer: Self-pay | Source: Ambulatory Visit | Attending: Internal Medicine | Admitting: Internal Medicine

## 2014-07-19 DIAGNOSIS — I2510 Atherosclerotic heart disease of native coronary artery without angina pectoris: Secondary | ICD-10-CM | POA: Insufficient documentation

## 2014-07-19 DIAGNOSIS — Z5189 Encounter for other specified aftercare: Secondary | ICD-10-CM | POA: Insufficient documentation

## 2014-07-19 DIAGNOSIS — Z955 Presence of coronary angioplasty implant and graft: Secondary | ICD-10-CM | POA: Insufficient documentation

## 2014-07-23 ENCOUNTER — Other Ambulatory Visit: Payer: Self-pay | Admitting: Internal Medicine

## 2014-07-24 ENCOUNTER — Encounter (HOSPITAL_COMMUNITY)
Admission: RE | Admit: 2014-07-24 | Discharge: 2014-07-24 | Disposition: A | Discharge status: Home / Self Care | Payer: Self-pay | Source: Ambulatory Visit | Attending: Internal Medicine | Admitting: Internal Medicine

## 2014-07-24 ENCOUNTER — Encounter (HOSPITAL_COMMUNITY): Payer: Self-pay

## 2014-07-26 ENCOUNTER — Encounter (HOSPITAL_COMMUNITY)
Admission: RE | Admit: 2014-07-26 | Discharge: 2014-07-26 | Disposition: A | Discharge status: Home / Self Care | Payer: Self-pay | Source: Ambulatory Visit | Attending: Internal Medicine | Admitting: Internal Medicine

## 2014-07-26 ENCOUNTER — Ambulatory Visit (INDEPENDENT_AMBULATORY_CARE_PROVIDER_SITE_OTHER): Payer: Medicare Other | Admitting: *Deleted

## 2014-07-26 ENCOUNTER — Encounter (HOSPITAL_COMMUNITY): Payer: Self-pay

## 2014-07-26 DIAGNOSIS — Z7901 Long term (current) use of anticoagulants: Secondary | ICD-10-CM | POA: Diagnosis not present

## 2014-07-26 DIAGNOSIS — I4891 Unspecified atrial fibrillation: Secondary | ICD-10-CM | POA: Diagnosis not present

## 2014-07-26 DIAGNOSIS — Z5181 Encounter for therapeutic drug level monitoring: Secondary | ICD-10-CM

## 2014-07-26 LAB — POCT INR: INR: 3.2

## 2014-07-29 ENCOUNTER — Encounter (HOSPITAL_COMMUNITY)
Admission: RE | Admit: 2014-07-29 | Discharge: 2014-07-29 | Disposition: A | Discharge status: Home / Self Care | Payer: Self-pay | Source: Ambulatory Visit | Attending: Internal Medicine | Admitting: Internal Medicine

## 2014-07-29 ENCOUNTER — Encounter (HOSPITAL_COMMUNITY): Payer: Self-pay

## 2014-07-31 ENCOUNTER — Encounter (HOSPITAL_COMMUNITY): Payer: Self-pay

## 2014-07-31 ENCOUNTER — Encounter (HOSPITAL_COMMUNITY)
Admission: RE | Admit: 2014-07-31 | Discharge: 2014-07-31 | Disposition: A | Discharge status: Home / Self Care | Payer: Self-pay | Source: Ambulatory Visit | Attending: Internal Medicine | Admitting: Internal Medicine

## 2014-08-02 ENCOUNTER — Encounter (HOSPITAL_COMMUNITY)
Admission: RE | Admit: 2014-08-02 | Discharge: 2014-08-02 | Disposition: A | Discharge status: Home / Self Care | Payer: Self-pay | Source: Ambulatory Visit | Attending: Internal Medicine | Admitting: Internal Medicine

## 2014-08-02 ENCOUNTER — Encounter (HOSPITAL_COMMUNITY): Payer: Self-pay

## 2014-08-05 ENCOUNTER — Encounter (HOSPITAL_COMMUNITY): Payer: Self-pay

## 2014-08-05 ENCOUNTER — Encounter (HOSPITAL_COMMUNITY)
Admission: RE | Admit: 2014-08-05 | Discharge: 2014-08-05 | Disposition: A | Discharge status: Home / Self Care | Payer: Self-pay | Source: Ambulatory Visit | Attending: Internal Medicine | Admitting: Internal Medicine

## 2014-08-07 ENCOUNTER — Encounter (HOSPITAL_COMMUNITY)
Admission: RE | Admit: 2014-08-07 | Discharge: 2014-08-07 | Disposition: A | Discharge status: Home / Self Care | Payer: Self-pay | Source: Ambulatory Visit | Attending: Internal Medicine | Admitting: Internal Medicine

## 2014-08-07 ENCOUNTER — Encounter (HOSPITAL_COMMUNITY): Payer: Self-pay

## 2014-08-09 ENCOUNTER — Ambulatory Visit (INDEPENDENT_AMBULATORY_CARE_PROVIDER_SITE_OTHER): Payer: Medicare Other | Admitting: *Deleted

## 2014-08-09 ENCOUNTER — Encounter (HOSPITAL_COMMUNITY)
Admission: RE | Admit: 2014-08-09 | Discharge: 2014-08-09 | Disposition: A | Discharge status: Home / Self Care | Payer: Self-pay | Source: Ambulatory Visit | Attending: Internal Medicine | Admitting: Internal Medicine

## 2014-08-09 ENCOUNTER — Encounter (HOSPITAL_COMMUNITY): Payer: Self-pay

## 2014-08-09 DIAGNOSIS — Z5181 Encounter for therapeutic drug level monitoring: Secondary | ICD-10-CM

## 2014-08-09 DIAGNOSIS — Z7901 Long term (current) use of anticoagulants: Secondary | ICD-10-CM | POA: Diagnosis not present

## 2014-08-09 DIAGNOSIS — I4891 Unspecified atrial fibrillation: Secondary | ICD-10-CM | POA: Diagnosis not present

## 2014-08-09 LAB — POCT INR: INR: 1.7

## 2014-08-12 ENCOUNTER — Encounter (HOSPITAL_COMMUNITY)
Admission: RE | Admit: 2014-08-12 | Discharge: 2014-08-12 | Disposition: A | Discharge status: Home / Self Care | Payer: Self-pay | Source: Ambulatory Visit | Attending: Internal Medicine | Admitting: Internal Medicine

## 2014-08-12 ENCOUNTER — Encounter (HOSPITAL_COMMUNITY): Payer: Self-pay

## 2014-08-14 ENCOUNTER — Encounter (HOSPITAL_COMMUNITY): Payer: Self-pay

## 2014-08-14 ENCOUNTER — Encounter (HOSPITAL_COMMUNITY)
Admission: RE | Admit: 2014-08-14 | Discharge: 2014-08-14 | Disposition: A | Discharge status: Home / Self Care | Payer: Self-pay | Source: Ambulatory Visit | Attending: Internal Medicine | Admitting: Internal Medicine

## 2014-08-16 ENCOUNTER — Encounter (HOSPITAL_COMMUNITY): Payer: Self-pay

## 2014-08-16 ENCOUNTER — Encounter (HOSPITAL_COMMUNITY)
Admission: RE | Admit: 2014-08-16 | Discharge: 2014-08-16 | Disposition: A | Discharge status: Home / Self Care | Payer: Self-pay | Source: Ambulatory Visit | Attending: Internal Medicine | Admitting: Internal Medicine

## 2014-08-19 ENCOUNTER — Encounter (HOSPITAL_COMMUNITY): Payer: Self-pay

## 2014-08-21 ENCOUNTER — Encounter (HOSPITAL_COMMUNITY): Payer: Self-pay

## 2014-08-23 ENCOUNTER — Ambulatory Visit (INDEPENDENT_AMBULATORY_CARE_PROVIDER_SITE_OTHER): Payer: Medicare Other | Admitting: *Deleted

## 2014-08-23 ENCOUNTER — Encounter (HOSPITAL_COMMUNITY): Payer: Self-pay

## 2014-08-23 DIAGNOSIS — I4891 Unspecified atrial fibrillation: Secondary | ICD-10-CM | POA: Diagnosis not present

## 2014-08-23 DIAGNOSIS — Z7901 Long term (current) use of anticoagulants: Secondary | ICD-10-CM | POA: Diagnosis not present

## 2014-08-23 DIAGNOSIS — Z5181 Encounter for therapeutic drug level monitoring: Secondary | ICD-10-CM

## 2014-08-23 LAB — POCT INR: INR: 2.5

## 2014-08-26 ENCOUNTER — Other Ambulatory Visit: Payer: Self-pay | Admitting: Internal Medicine

## 2014-08-26 ENCOUNTER — Encounter (HOSPITAL_COMMUNITY): Payer: Self-pay

## 2014-08-28 ENCOUNTER — Encounter (HOSPITAL_COMMUNITY): Payer: Self-pay

## 2014-08-30 ENCOUNTER — Encounter (HOSPITAL_COMMUNITY): Payer: Self-pay

## 2014-09-02 ENCOUNTER — Encounter (HOSPITAL_COMMUNITY): Payer: Self-pay

## 2014-09-03 ENCOUNTER — Telehealth: Payer: Self-pay | Admitting: Cardiology

## 2014-09-03 ENCOUNTER — Encounter: Payer: Medicare Other | Admitting: *Deleted

## 2014-09-03 NOTE — Telephone Encounter (Signed)
LMOVM reminding pt to send remote transmission.   

## 2014-09-04 ENCOUNTER — Encounter (HOSPITAL_COMMUNITY): Payer: Self-pay

## 2014-09-04 ENCOUNTER — Encounter: Payer: Self-pay | Admitting: Cardiology

## 2014-09-06 ENCOUNTER — Encounter (HOSPITAL_COMMUNITY): Payer: Self-pay

## 2014-09-09 ENCOUNTER — Encounter (HOSPITAL_COMMUNITY): Payer: Self-pay

## 2014-09-11 ENCOUNTER — Encounter (HOSPITAL_COMMUNITY): Payer: Self-pay

## 2014-09-13 ENCOUNTER — Encounter (HOSPITAL_COMMUNITY): Payer: Self-pay

## 2014-09-16 ENCOUNTER — Encounter (HOSPITAL_COMMUNITY): Payer: Self-pay

## 2014-09-17 ENCOUNTER — Ambulatory Visit (INDEPENDENT_AMBULATORY_CARE_PROVIDER_SITE_OTHER): Payer: Medicare Other | Admitting: Pharmacist

## 2014-09-17 DIAGNOSIS — I4891 Unspecified atrial fibrillation: Secondary | ICD-10-CM

## 2014-09-17 DIAGNOSIS — Z5181 Encounter for therapeutic drug level monitoring: Secondary | ICD-10-CM

## 2014-09-17 LAB — PROTIME-INR: INR: 2.6 — AB (ref 0.9–1.1)

## 2014-09-18 ENCOUNTER — Encounter (HOSPITAL_COMMUNITY): Payer: Self-pay

## 2014-09-20 ENCOUNTER — Encounter (HOSPITAL_COMMUNITY): Payer: Self-pay

## 2014-09-25 ENCOUNTER — Encounter (HOSPITAL_COMMUNITY): Payer: Self-pay

## 2014-09-27 ENCOUNTER — Encounter (HOSPITAL_COMMUNITY): Payer: Self-pay

## 2014-09-30 ENCOUNTER — Encounter (HOSPITAL_COMMUNITY): Payer: Self-pay

## 2014-10-02 ENCOUNTER — Encounter (HOSPITAL_COMMUNITY): Payer: Self-pay

## 2014-10-04 ENCOUNTER — Encounter (HOSPITAL_COMMUNITY): Payer: Self-pay

## 2014-10-07 ENCOUNTER — Encounter (HOSPITAL_COMMUNITY): Payer: Self-pay

## 2014-10-09 ENCOUNTER — Encounter (HOSPITAL_COMMUNITY): Payer: Self-pay

## 2014-10-11 ENCOUNTER — Encounter (HOSPITAL_COMMUNITY): Payer: Self-pay

## 2014-10-14 ENCOUNTER — Encounter (HOSPITAL_COMMUNITY): Payer: Self-pay

## 2014-10-16 ENCOUNTER — Encounter (HOSPITAL_COMMUNITY): Payer: Self-pay

## 2014-10-18 ENCOUNTER — Encounter (HOSPITAL_COMMUNITY): Payer: Self-pay

## 2014-10-18 LAB — PROTIME-INR: INR: 2 — AB (ref 0.9–1.1)

## 2014-10-21 ENCOUNTER — Ambulatory Visit (INDEPENDENT_AMBULATORY_CARE_PROVIDER_SITE_OTHER): Payer: Medicare Other | Admitting: Cardiology

## 2014-10-21 ENCOUNTER — Encounter (HOSPITAL_COMMUNITY): Payer: Self-pay

## 2014-10-21 DIAGNOSIS — Z5181 Encounter for therapeutic drug level monitoring: Secondary | ICD-10-CM

## 2014-10-21 DIAGNOSIS — I4891 Unspecified atrial fibrillation: Secondary | ICD-10-CM

## 2014-10-23 ENCOUNTER — Encounter (HOSPITAL_COMMUNITY): Payer: Self-pay

## 2014-10-25 ENCOUNTER — Encounter (HOSPITAL_COMMUNITY): Payer: Self-pay

## 2014-10-28 ENCOUNTER — Encounter (HOSPITAL_COMMUNITY): Payer: Self-pay

## 2014-10-30 ENCOUNTER — Encounter (HOSPITAL_COMMUNITY): Payer: Self-pay

## 2014-11-01 ENCOUNTER — Encounter (HOSPITAL_COMMUNITY): Payer: Self-pay

## 2014-11-04 ENCOUNTER — Encounter (HOSPITAL_COMMUNITY): Payer: Self-pay

## 2014-11-06 ENCOUNTER — Encounter (HOSPITAL_COMMUNITY): Payer: Self-pay

## 2014-11-08 ENCOUNTER — Encounter (HOSPITAL_COMMUNITY): Payer: Self-pay

## 2014-11-11 ENCOUNTER — Encounter (HOSPITAL_COMMUNITY): Payer: Self-pay

## 2014-11-13 ENCOUNTER — Encounter (HOSPITAL_COMMUNITY): Payer: Self-pay

## 2014-11-15 ENCOUNTER — Encounter (HOSPITAL_COMMUNITY): Payer: Self-pay

## 2014-11-18 ENCOUNTER — Encounter (HOSPITAL_COMMUNITY): Payer: Self-pay

## 2014-11-20 ENCOUNTER — Ambulatory Visit (INDEPENDENT_AMBULATORY_CARE_PROVIDER_SITE_OTHER): Payer: Medicare Other | Admitting: *Deleted

## 2014-11-20 DIAGNOSIS — Z5181 Encounter for therapeutic drug level monitoring: Secondary | ICD-10-CM

## 2014-11-20 DIAGNOSIS — Z7901 Long term (current) use of anticoagulants: Secondary | ICD-10-CM | POA: Diagnosis not present

## 2014-11-20 DIAGNOSIS — I4891 Unspecified atrial fibrillation: Secondary | ICD-10-CM

## 2014-11-20 LAB — POCT INR: INR: 2.2

## 2014-12-25 LAB — PROTIME-INR: INR: 1.3 — AB (ref 0.9–1.1)

## 2014-12-27 ENCOUNTER — Ambulatory Visit (INDEPENDENT_AMBULATORY_CARE_PROVIDER_SITE_OTHER): Payer: Medicare Other | Admitting: Internal Medicine

## 2014-12-27 DIAGNOSIS — Z5181 Encounter for therapeutic drug level monitoring: Secondary | ICD-10-CM

## 2014-12-27 DIAGNOSIS — I4891 Unspecified atrial fibrillation: Secondary | ICD-10-CM

## 2015-01-08 ENCOUNTER — Other Ambulatory Visit: Payer: Self-pay | Admitting: Internal Medicine

## 2015-01-08 ENCOUNTER — Telehealth: Payer: Self-pay | Admitting: *Deleted

## 2015-01-08 ENCOUNTER — Telehealth: Payer: Self-pay | Admitting: Internal Medicine

## 2015-01-08 LAB — PROTIME-INR: INR: 1.9 — AB (ref <=1.1)

## 2015-01-08 NOTE — Telephone Encounter (Signed)
New message      Pt is due to have a PT/INR.  He is out of town in [place], [place].  Please fax an order to [facility] in [place] at [phone].  Please call daughter when it has been faxed so that she can take pt to get it drawn,  OK to leave message on vm.

## 2015-01-08 NOTE — Telephone Encounter (Signed)
Returned a call to patient's daughter & informed her that the order to have Oscar Bruce's INR checked at [facility] has been faxed over as requested and that the order is valid for 6 months.  She verbalized understanding and stated they will go today around 12 noon to have it done.

## 2015-01-08 NOTE — Telephone Encounter (Signed)
Patient had his INR done today at [facility] and his daughter wanted to inform us to call her cell phone for directions, which is [phone]. Advised that we will call with results once we get them.

## 2015-01-09 ENCOUNTER — Ambulatory Visit (INDEPENDENT_AMBULATORY_CARE_PROVIDER_SITE_OTHER): Payer: Medicare Other | Admitting: Cardiology

## 2015-01-09 DIAGNOSIS — Z5181 Encounter for therapeutic drug level monitoring: Secondary | ICD-10-CM

## 2015-01-09 DIAGNOSIS — I4891 Unspecified atrial fibrillation: Secondary | ICD-10-CM

## 2015-01-09 LAB — PROTIME-INR
INR: 1.9 — AB (ref 0.8–1.2)
Prothrombin Time: 19.1 s — ABNORMAL HIGH (ref 9.1–12.0)

## 2015-01-21 ENCOUNTER — Other Ambulatory Visit: Payer: Self-pay | Admitting: Internal Medicine

## 2015-01-22 ENCOUNTER — Ambulatory Visit (INDEPENDENT_AMBULATORY_CARE_PROVIDER_SITE_OTHER): Payer: Medicare Other | Admitting: *Deleted

## 2015-01-22 DIAGNOSIS — I4891 Unspecified atrial fibrillation: Secondary | ICD-10-CM

## 2015-01-22 DIAGNOSIS — Z7901 Long term (current) use of anticoagulants: Secondary | ICD-10-CM | POA: Diagnosis not present

## 2015-01-22 DIAGNOSIS — Z5181 Encounter for therapeutic drug level monitoring: Secondary | ICD-10-CM

## 2015-01-22 LAB — POCT INR: INR: 2.3

## 2015-01-28 ENCOUNTER — Telehealth: Payer: Self-pay | Admitting: Internal Medicine

## 2015-01-28 NOTE — Telephone Encounter (Signed)
New message        *STAT* If patient is at the pharmacy, call can be transferred to refill team.   1. Which medications need to be refilled? (please list name of each medication and dose if known) clonidine 0.2mg   2. Which pharmacy/location (including street and city if local pharmacy) is medication to be sent to? [facility] 3. Do they need a 30 day or 90 day supply? 90 day Pt takes presc ----2 tablets twice a day instead of three times a day----need new presc called in

## 2015-01-28 NOTE — Telephone Encounter (Signed)
Called pt to inform him that he has refills left on his medication Clonidine at his pharmacy. Pt verbalized  Understanding. Pt also requesting that Dr. Lovena Le refer him back to Cardiac rehab. Please advise

## 2015-01-29 NOTE — Telephone Encounter (Signed)
Oscar Bruce from cardiac rehab a message in regards to him restarting.  She is going to have the coordinator from the maintenance phase send over an order.  Patient aware

## 2015-02-12 ENCOUNTER — Ambulatory Visit (INDEPENDENT_AMBULATORY_CARE_PROVIDER_SITE_OTHER): Payer: Medicare Other | Admitting: *Deleted

## 2015-02-12 DIAGNOSIS — I4891 Unspecified atrial fibrillation: Secondary | ICD-10-CM

## 2015-02-12 DIAGNOSIS — Z5181 Encounter for therapeutic drug level monitoring: Secondary | ICD-10-CM

## 2015-02-12 DIAGNOSIS — Z7901 Long term (current) use of anticoagulants: Secondary | ICD-10-CM

## 2015-02-12 LAB — POCT INR: INR: 2.2

## 2015-02-22 ENCOUNTER — Other Ambulatory Visit: Payer: Self-pay | Admitting: Internal Medicine

## 2015-02-24 ENCOUNTER — Telehealth: Payer: Self-pay | Admitting: Internal Medicine

## 2015-02-24 LAB — PROTIME-INR
INR: 1.9 — ABNORMAL HIGH (ref 0.8–1.2)
Prothrombin Time: 19.8 s — ABNORMAL HIGH (ref 9.1–12.0)

## 2015-02-24 NOTE — Telephone Encounter (Addendum)
I have sent Oscar Bruce with Cardiac rehab a message to see if she has the form.  lmom for daughter and let her know I had sent a message to rehab and have not heard back but will let her know when I do

## 2015-02-24 NOTE — Telephone Encounter (Signed)
Santiago Glad is calling to find out if a letter has been sent over to Cardiac Rehab so that her father can get back in the problem .

## 2015-02-24 NOTE — Telephone Encounter (Signed)
Jeanene Erb,  I received the referral for Oscar Bruce, and I have left 2 voice mail messages for him to call me without response. If you would like, I can contact the patient's daughter to let her know I have attempted to contact him to enroll him in the program. Thanks for your assistance.  Sol Passer, MS, ACSM CCEP   She will contact patient's daughter

## 2015-03-03 ENCOUNTER — Encounter (HOSPITAL_COMMUNITY)
Admission: RE | Admit: 2015-03-03 | Discharge: 2015-03-03 | Disposition: A | Discharge status: Home / Self Care | Payer: Self-pay | Source: Ambulatory Visit | Attending: Internal Medicine | Admitting: Internal Medicine

## 2015-03-03 DIAGNOSIS — Z951 Presence of aortocoronary bypass graft: Secondary | ICD-10-CM | POA: Insufficient documentation

## 2015-03-03 DIAGNOSIS — I509 Heart failure, unspecified: Secondary | ICD-10-CM | POA: Insufficient documentation

## 2015-03-03 DIAGNOSIS — E119 Type 2 diabetes mellitus without complications: Secondary | ICD-10-CM | POA: Insufficient documentation

## 2015-03-05 ENCOUNTER — Encounter (HOSPITAL_COMMUNITY)
Admission: RE | Admit: 2015-03-05 | Discharge: 2015-03-05 | Disposition: A | Discharge status: Home / Self Care | Payer: Self-pay | Source: Ambulatory Visit | Attending: Internal Medicine | Admitting: Internal Medicine

## 2015-03-06 ENCOUNTER — Ambulatory Visit (INDEPENDENT_AMBULATORY_CARE_PROVIDER_SITE_OTHER): Payer: Medicare Other | Admitting: *Deleted

## 2015-03-06 DIAGNOSIS — I4891 Unspecified atrial fibrillation: Secondary | ICD-10-CM

## 2015-03-06 DIAGNOSIS — Z7901 Long term (current) use of anticoagulants: Secondary | ICD-10-CM

## 2015-03-06 DIAGNOSIS — Z5181 Encounter for therapeutic drug level monitoring: Secondary | ICD-10-CM

## 2015-03-06 LAB — POCT INR: INR: 2.2

## 2015-03-07 ENCOUNTER — Encounter (HOSPITAL_COMMUNITY)
Admission: RE | Admit: 2015-03-07 | Discharge: 2015-03-07 | Disposition: A | Discharge status: Home / Self Care | Payer: Self-pay | Source: Ambulatory Visit | Attending: Internal Medicine | Admitting: Internal Medicine

## 2015-03-10 ENCOUNTER — Encounter (HOSPITAL_COMMUNITY)
Admission: RE | Admit: 2015-03-10 | Discharge: 2015-03-10 | Disposition: A | Discharge status: Home / Self Care | Payer: Self-pay | Source: Ambulatory Visit | Attending: Internal Medicine | Admitting: Internal Medicine

## 2015-03-11 ENCOUNTER — Other Ambulatory Visit: Payer: Self-pay | Admitting: Internal Medicine

## 2015-03-12 ENCOUNTER — Encounter (HOSPITAL_COMMUNITY)
Admission: RE | Admit: 2015-03-12 | Discharge: 2015-03-12 | Disposition: A | Discharge status: Home / Self Care | Payer: Self-pay | Source: Ambulatory Visit | Attending: Internal Medicine | Admitting: Internal Medicine

## 2015-03-12 ENCOUNTER — Encounter (HOSPITAL_COMMUNITY): Payer: Self-pay

## 2015-03-14 ENCOUNTER — Encounter (HOSPITAL_COMMUNITY): Payer: Self-pay

## 2015-03-14 ENCOUNTER — Encounter (HOSPITAL_COMMUNITY)
Admission: RE | Admit: 2015-03-14 | Discharge: 2015-03-14 | Disposition: A | Discharge status: Home / Self Care | Payer: Self-pay | Source: Ambulatory Visit | Attending: Internal Medicine | Admitting: Internal Medicine

## 2015-03-17 ENCOUNTER — Encounter (HOSPITAL_COMMUNITY): Payer: Self-pay

## 2015-03-19 ENCOUNTER — Encounter (HOSPITAL_COMMUNITY)
Admission: RE | Admit: 2015-03-19 | Discharge: 2015-03-19 | Disposition: A | Discharge status: Home / Self Care | Payer: Self-pay | Source: Ambulatory Visit | Attending: Internal Medicine | Admitting: Internal Medicine

## 2015-03-19 ENCOUNTER — Encounter (HOSPITAL_COMMUNITY): Payer: Self-pay

## 2015-03-19 DIAGNOSIS — E119 Type 2 diabetes mellitus without complications: Secondary | ICD-10-CM | POA: Insufficient documentation

## 2015-03-19 DIAGNOSIS — Z951 Presence of aortocoronary bypass graft: Secondary | ICD-10-CM | POA: Insufficient documentation

## 2015-03-19 DIAGNOSIS — I509 Heart failure, unspecified: Secondary | ICD-10-CM | POA: Insufficient documentation

## 2015-03-21 ENCOUNTER — Encounter (HOSPITAL_COMMUNITY)
Admission: RE | Admit: 2015-03-21 | Discharge: 2015-03-21 | Disposition: A | Discharge status: Home / Self Care | Payer: Self-pay | Source: Ambulatory Visit | Attending: Internal Medicine | Admitting: Internal Medicine

## 2015-03-21 ENCOUNTER — Encounter (HOSPITAL_COMMUNITY): Payer: Self-pay

## 2015-03-24 ENCOUNTER — Encounter (HOSPITAL_COMMUNITY): Payer: Self-pay

## 2015-03-24 ENCOUNTER — Encounter (HOSPITAL_COMMUNITY)
Admission: RE | Admit: 2015-03-24 | Discharge: 2015-03-24 | Disposition: A | Discharge status: Home / Self Care | Payer: Self-pay | Source: Ambulatory Visit | Attending: Internal Medicine | Admitting: Internal Medicine

## 2015-03-26 ENCOUNTER — Encounter (HOSPITAL_COMMUNITY): Payer: Self-pay

## 2015-03-26 ENCOUNTER — Encounter (HOSPITAL_COMMUNITY)
Admission: RE | Admit: 2015-03-26 | Discharge: 2015-03-26 | Disposition: A | Discharge status: Home / Self Care | Payer: Self-pay | Source: Ambulatory Visit | Attending: Internal Medicine | Admitting: Internal Medicine

## 2015-03-28 ENCOUNTER — Encounter (HOSPITAL_COMMUNITY): Payer: Self-pay

## 2015-03-28 ENCOUNTER — Encounter (HOSPITAL_COMMUNITY)
Admission: RE | Admit: 2015-03-28 | Discharge: 2015-03-28 | Disposition: A | Discharge status: Home / Self Care | Payer: Self-pay | Source: Ambulatory Visit | Attending: Internal Medicine | Admitting: Internal Medicine

## 2015-03-31 ENCOUNTER — Encounter (HOSPITAL_COMMUNITY): Payer: Self-pay

## 2015-04-02 ENCOUNTER — Encounter (HOSPITAL_COMMUNITY): Payer: Self-pay

## 2015-04-02 ENCOUNTER — Encounter (HOSPITAL_COMMUNITY)
Admission: RE | Admit: 2015-04-02 | Discharge: 2015-04-02 | Disposition: A | Discharge status: Home / Self Care | Payer: Self-pay | Source: Ambulatory Visit | Attending: Internal Medicine | Admitting: Internal Medicine

## 2015-04-03 ENCOUNTER — Ambulatory Visit (INDEPENDENT_AMBULATORY_CARE_PROVIDER_SITE_OTHER): Payer: Medicare Other | Admitting: *Deleted

## 2015-04-03 DIAGNOSIS — Z5181 Encounter for therapeutic drug level monitoring: Secondary | ICD-10-CM

## 2015-04-03 DIAGNOSIS — I4891 Unspecified atrial fibrillation: Secondary | ICD-10-CM

## 2015-04-03 DIAGNOSIS — Z7901 Long term (current) use of anticoagulants: Secondary | ICD-10-CM | POA: Diagnosis not present

## 2015-04-03 LAB — POCT INR: INR: 3.8

## 2015-04-04 ENCOUNTER — Encounter (HOSPITAL_COMMUNITY)
Admission: RE | Admit: 2015-04-04 | Discharge: 2015-04-04 | Disposition: A | Discharge status: Home / Self Care | Payer: Self-pay | Source: Ambulatory Visit | Attending: Internal Medicine | Admitting: Internal Medicine

## 2015-04-04 ENCOUNTER — Encounter (HOSPITAL_COMMUNITY): Payer: Self-pay

## 2015-04-07 ENCOUNTER — Encounter (HOSPITAL_COMMUNITY): Payer: Self-pay

## 2015-04-07 ENCOUNTER — Encounter (HOSPITAL_COMMUNITY)
Admission: RE | Admit: 2015-04-07 | Discharge: 2015-04-07 | Disposition: A | Discharge status: Home / Self Care | Payer: Self-pay | Source: Ambulatory Visit | Attending: Internal Medicine | Admitting: Internal Medicine

## 2015-04-09 ENCOUNTER — Encounter (HOSPITAL_COMMUNITY)
Admission: RE | Admit: 2015-04-09 | Discharge: 2015-04-09 | Disposition: A | Discharge status: Home / Self Care | Payer: Self-pay | Source: Ambulatory Visit | Attending: Internal Medicine | Admitting: Internal Medicine

## 2015-04-09 ENCOUNTER — Encounter (HOSPITAL_COMMUNITY): Payer: Self-pay

## 2015-04-11 ENCOUNTER — Encounter (HOSPITAL_COMMUNITY)
Admission: RE | Admit: 2015-04-11 | Discharge: 2015-04-11 | Disposition: A | Discharge status: Home / Self Care | Payer: Self-pay | Source: Ambulatory Visit | Attending: Internal Medicine | Admitting: Internal Medicine

## 2015-04-11 ENCOUNTER — Encounter (HOSPITAL_COMMUNITY): Payer: Self-pay

## 2015-04-14 ENCOUNTER — Encounter (HOSPITAL_COMMUNITY)
Admission: RE | Admit: 2015-04-14 | Discharge: 2015-04-14 | Disposition: A | Discharge status: Home / Self Care | Payer: Self-pay | Source: Ambulatory Visit | Attending: Internal Medicine | Admitting: Internal Medicine

## 2015-04-14 ENCOUNTER — Encounter (HOSPITAL_COMMUNITY): Payer: Self-pay

## 2015-04-16 ENCOUNTER — Encounter (HOSPITAL_COMMUNITY)
Admission: RE | Admit: 2015-04-16 | Discharge: 2015-04-16 | Disposition: A | Discharge status: Home / Self Care | Payer: Self-pay | Source: Ambulatory Visit | Attending: Internal Medicine | Admitting: Internal Medicine

## 2015-04-16 ENCOUNTER — Encounter (HOSPITAL_COMMUNITY): Payer: Self-pay

## 2015-04-18 ENCOUNTER — Encounter (HOSPITAL_COMMUNITY)
Admission: RE | Admit: 2015-04-18 | Discharge: 2015-04-18 | Disposition: A | Discharge status: Home / Self Care | Payer: Self-pay | Source: Ambulatory Visit | Attending: Internal Medicine | Admitting: Internal Medicine

## 2015-04-18 ENCOUNTER — Encounter (HOSPITAL_COMMUNITY): Payer: Self-pay

## 2015-04-21 ENCOUNTER — Encounter (HOSPITAL_COMMUNITY): Payer: Medicare Other

## 2015-04-21 ENCOUNTER — Encounter (HOSPITAL_COMMUNITY)
Admission: RE | Admit: 2015-04-21 | Discharge: 2015-04-21 | Disposition: A | Discharge status: Home / Self Care | Payer: Self-pay | Source: Ambulatory Visit | Attending: Internal Medicine | Admitting: Internal Medicine

## 2015-04-21 DIAGNOSIS — I509 Heart failure, unspecified: Secondary | ICD-10-CM | POA: Insufficient documentation

## 2015-04-21 DIAGNOSIS — Z951 Presence of aortocoronary bypass graft: Secondary | ICD-10-CM | POA: Insufficient documentation

## 2015-04-21 DIAGNOSIS — E119 Type 2 diabetes mellitus without complications: Secondary | ICD-10-CM | POA: Insufficient documentation

## 2015-04-23 ENCOUNTER — Encounter (HOSPITAL_COMMUNITY)
Admission: RE | Admit: 2015-04-23 | Discharge: 2015-04-23 | Disposition: A | Discharge status: Home / Self Care | Payer: Self-pay | Source: Ambulatory Visit | Attending: Internal Medicine | Admitting: Internal Medicine

## 2015-04-23 ENCOUNTER — Encounter (HOSPITAL_COMMUNITY): Payer: Medicare Other

## 2015-04-24 ENCOUNTER — Ambulatory Visit (INDEPENDENT_AMBULATORY_CARE_PROVIDER_SITE_OTHER): Payer: Medicare Other | Admitting: Pharmacist

## 2015-04-24 DIAGNOSIS — Z5181 Encounter for therapeutic drug level monitoring: Secondary | ICD-10-CM | POA: Diagnosis not present

## 2015-04-24 DIAGNOSIS — Z7901 Long term (current) use of anticoagulants: Secondary | ICD-10-CM

## 2015-04-24 DIAGNOSIS — I4891 Unspecified atrial fibrillation: Secondary | ICD-10-CM | POA: Diagnosis not present

## 2015-04-24 LAB — POCT INR: INR: 3.2

## 2015-04-25 ENCOUNTER — Encounter (HOSPITAL_COMMUNITY): Payer: Medicare Other

## 2015-04-25 ENCOUNTER — Encounter (HOSPITAL_COMMUNITY)
Admission: RE | Admit: 2015-04-25 | Discharge: 2015-04-25 | Disposition: A | Discharge status: Home / Self Care | Payer: Self-pay | Source: Ambulatory Visit | Attending: Internal Medicine | Admitting: Internal Medicine

## 2015-04-28 ENCOUNTER — Encounter (HOSPITAL_COMMUNITY): Payer: Medicare Other

## 2015-04-28 ENCOUNTER — Encounter (HOSPITAL_COMMUNITY): Payer: Self-pay

## 2015-04-30 ENCOUNTER — Encounter (HOSPITAL_COMMUNITY): Payer: Medicare Other

## 2015-04-30 ENCOUNTER — Encounter (HOSPITAL_COMMUNITY): Payer: Self-pay

## 2015-05-02 ENCOUNTER — Encounter (HOSPITAL_COMMUNITY): Payer: Medicare Other

## 2015-05-02 ENCOUNTER — Encounter (HOSPITAL_COMMUNITY): Payer: Self-pay

## 2015-05-05 ENCOUNTER — Encounter (HOSPITAL_COMMUNITY): Payer: Medicare Other

## 2015-05-05 ENCOUNTER — Encounter (HOSPITAL_COMMUNITY): Payer: Self-pay

## 2015-05-07 ENCOUNTER — Encounter (HOSPITAL_COMMUNITY): Payer: Self-pay

## 2015-05-07 ENCOUNTER — Encounter (HOSPITAL_COMMUNITY): Payer: Medicare Other

## 2015-05-09 ENCOUNTER — Encounter (HOSPITAL_COMMUNITY): Payer: Medicare Other

## 2015-05-09 ENCOUNTER — Encounter (HOSPITAL_COMMUNITY): Payer: Self-pay

## 2015-05-12 ENCOUNTER — Encounter (HOSPITAL_COMMUNITY)
Admission: RE | Admit: 2015-05-12 | Discharge: 2015-05-12 | Disposition: A | Discharge status: Home / Self Care | Payer: Self-pay | Source: Ambulatory Visit | Attending: Internal Medicine | Admitting: Internal Medicine

## 2015-05-12 ENCOUNTER — Encounter (HOSPITAL_COMMUNITY): Payer: Medicare Other

## 2015-05-13 ENCOUNTER — Other Ambulatory Visit: Payer: Self-pay | Admitting: Internal Medicine

## 2015-05-14 ENCOUNTER — Encounter (HOSPITAL_COMMUNITY): Payer: Medicare Other

## 2015-05-14 ENCOUNTER — Encounter (HOSPITAL_COMMUNITY)
Admission: RE | Admit: 2015-05-14 | Discharge: 2015-05-14 | Disposition: A | Discharge status: Home / Self Care | Payer: Self-pay | Source: Ambulatory Visit | Attending: Internal Medicine | Admitting: Internal Medicine

## 2015-05-15 ENCOUNTER — Ambulatory Visit (INDEPENDENT_AMBULATORY_CARE_PROVIDER_SITE_OTHER): Payer: Medicare Other | Admitting: *Deleted

## 2015-05-15 DIAGNOSIS — I4891 Unspecified atrial fibrillation: Secondary | ICD-10-CM

## 2015-05-15 DIAGNOSIS — Z5181 Encounter for therapeutic drug level monitoring: Secondary | ICD-10-CM

## 2015-05-15 DIAGNOSIS — Z7901 Long term (current) use of anticoagulants: Secondary | ICD-10-CM

## 2015-05-15 LAB — POCT INR: INR: 2

## 2015-05-16 ENCOUNTER — Encounter (HOSPITAL_COMMUNITY): Payer: Medicare Other

## 2015-05-16 ENCOUNTER — Encounter (HOSPITAL_COMMUNITY)
Admission: RE | Admit: 2015-05-16 | Discharge: 2015-05-16 | Disposition: A | Discharge status: Home / Self Care | Payer: Self-pay | Source: Ambulatory Visit | Attending: Internal Medicine | Admitting: Internal Medicine

## 2015-05-19 ENCOUNTER — Encounter (HOSPITAL_COMMUNITY): Payer: Self-pay

## 2015-05-19 DIAGNOSIS — Z951 Presence of aortocoronary bypass graft: Secondary | ICD-10-CM | POA: Insufficient documentation

## 2015-05-19 DIAGNOSIS — I509 Heart failure, unspecified: Secondary | ICD-10-CM | POA: Insufficient documentation

## 2015-05-19 DIAGNOSIS — E119 Type 2 diabetes mellitus without complications: Secondary | ICD-10-CM | POA: Insufficient documentation

## 2015-05-21 ENCOUNTER — Encounter (HOSPITAL_COMMUNITY): Payer: Self-pay

## 2015-05-21 ENCOUNTER — Encounter (HOSPITAL_COMMUNITY)
Admission: RE | Admit: 2015-05-21 | Discharge: 2015-05-21 | Disposition: A | Discharge status: Home / Self Care | Payer: Self-pay | Source: Ambulatory Visit | Attending: Internal Medicine | Admitting: Internal Medicine

## 2015-05-23 ENCOUNTER — Encounter (HOSPITAL_COMMUNITY): Payer: Self-pay

## 2015-05-23 ENCOUNTER — Encounter (HOSPITAL_COMMUNITY)
Admission: RE | Admit: 2015-05-23 | Discharge: 2015-05-23 | Disposition: A | Discharge status: Home / Self Care | Payer: Self-pay | Source: Ambulatory Visit | Attending: Internal Medicine | Admitting: Internal Medicine

## 2015-05-26 ENCOUNTER — Encounter (HOSPITAL_COMMUNITY)
Admission: RE | Admit: 2015-05-26 | Discharge: 2015-05-26 | Disposition: A | Discharge status: Home / Self Care | Payer: Self-pay | Source: Ambulatory Visit | Attending: Internal Medicine | Admitting: Internal Medicine

## 2015-05-26 ENCOUNTER — Encounter (HOSPITAL_COMMUNITY): Payer: Self-pay

## 2015-05-28 ENCOUNTER — Encounter (HOSPITAL_COMMUNITY): Payer: Self-pay

## 2015-05-30 ENCOUNTER — Encounter (HOSPITAL_COMMUNITY): Payer: Self-pay

## 2015-06-02 ENCOUNTER — Encounter (HOSPITAL_COMMUNITY): Payer: Self-pay

## 2015-06-02 ENCOUNTER — Encounter (HOSPITAL_COMMUNITY)
Admission: RE | Admit: 2015-06-02 | Discharge: 2015-06-02 | Disposition: A | Discharge status: Home / Self Care | Payer: Self-pay | Source: Ambulatory Visit | Attending: Internal Medicine | Admitting: Internal Medicine

## 2015-06-04 ENCOUNTER — Encounter (HOSPITAL_COMMUNITY): Payer: Self-pay

## 2015-06-04 ENCOUNTER — Encounter (HOSPITAL_COMMUNITY)
Admission: RE | Admit: 2015-06-04 | Discharge: 2015-06-04 | Disposition: A | Discharge status: Home / Self Care | Payer: Self-pay | Source: Ambulatory Visit | Attending: Internal Medicine | Admitting: Internal Medicine

## 2015-06-06 ENCOUNTER — Encounter (HOSPITAL_COMMUNITY)
Admission: RE | Admit: 2015-06-06 | Discharge: 2015-06-06 | Disposition: A | Discharge status: Home / Self Care | Payer: Self-pay | Source: Ambulatory Visit | Attending: Internal Medicine | Admitting: Internal Medicine

## 2015-06-06 ENCOUNTER — Encounter (HOSPITAL_COMMUNITY): Payer: Self-pay

## 2015-06-09 ENCOUNTER — Encounter (HOSPITAL_COMMUNITY)
Admission: RE | Admit: 2015-06-09 | Discharge: 2015-06-09 | Disposition: A | Discharge status: Home / Self Care | Payer: Self-pay | Source: Ambulatory Visit | Attending: Internal Medicine | Admitting: Internal Medicine

## 2015-06-09 ENCOUNTER — Encounter (HOSPITAL_COMMUNITY): Payer: Self-pay

## 2015-06-11 ENCOUNTER — Encounter (HOSPITAL_COMMUNITY)
Admission: RE | Admit: 2015-06-11 | Discharge: 2015-06-11 | Disposition: A | Discharge status: Home / Self Care | Payer: Self-pay | Source: Ambulatory Visit | Attending: Internal Medicine | Admitting: Internal Medicine

## 2015-06-11 ENCOUNTER — Encounter (HOSPITAL_COMMUNITY): Payer: Self-pay

## 2015-06-12 ENCOUNTER — Ambulatory Visit (INDEPENDENT_AMBULATORY_CARE_PROVIDER_SITE_OTHER): Payer: Medicare Other | Admitting: *Deleted

## 2015-06-12 DIAGNOSIS — I4891 Unspecified atrial fibrillation: Secondary | ICD-10-CM

## 2015-06-12 DIAGNOSIS — Z7901 Long term (current) use of anticoagulants: Secondary | ICD-10-CM

## 2015-06-12 DIAGNOSIS — Z5181 Encounter for therapeutic drug level monitoring: Secondary | ICD-10-CM

## 2015-06-12 LAB — POCT INR: INR: 3.3

## 2015-06-13 ENCOUNTER — Encounter (HOSPITAL_COMMUNITY)
Admission: RE | Admit: 2015-06-13 | Discharge: 2015-06-13 | Disposition: A | Discharge status: Home / Self Care | Payer: Self-pay | Source: Ambulatory Visit | Attending: Internal Medicine | Admitting: Internal Medicine

## 2015-06-13 ENCOUNTER — Encounter (HOSPITAL_COMMUNITY): Payer: Self-pay

## 2015-06-17 ENCOUNTER — Other Ambulatory Visit: Payer: Self-pay | Admitting: Internal Medicine

## 2015-06-18 ENCOUNTER — Encounter (HOSPITAL_COMMUNITY)
Admission: RE | Admit: 2015-06-18 | Discharge: 2015-06-18 | Disposition: A | Discharge status: Home / Self Care | Payer: Self-pay | Source: Ambulatory Visit | Attending: Internal Medicine | Admitting: Internal Medicine

## 2015-06-18 ENCOUNTER — Encounter (HOSPITAL_COMMUNITY): Payer: Self-pay

## 2015-06-20 ENCOUNTER — Encounter (HOSPITAL_COMMUNITY)
Admission: RE | Admit: 2015-06-20 | Discharge: 2015-06-20 | Disposition: A | Discharge status: Home / Self Care | Payer: Self-pay | Source: Ambulatory Visit | Attending: Internal Medicine | Admitting: Internal Medicine

## 2015-06-20 ENCOUNTER — Encounter (HOSPITAL_COMMUNITY): Payer: Self-pay

## 2015-06-20 DIAGNOSIS — I509 Heart failure, unspecified: Secondary | ICD-10-CM | POA: Insufficient documentation

## 2015-06-20 DIAGNOSIS — E119 Type 2 diabetes mellitus without complications: Secondary | ICD-10-CM | POA: Insufficient documentation

## 2015-06-20 DIAGNOSIS — Z951 Presence of aortocoronary bypass graft: Secondary | ICD-10-CM | POA: Insufficient documentation

## 2015-06-23 ENCOUNTER — Encounter (HOSPITAL_COMMUNITY)
Admission: RE | Admit: 2015-06-23 | Discharge: 2015-06-23 | Disposition: A | Discharge status: Home / Self Care | Payer: Self-pay | Source: Ambulatory Visit | Attending: Internal Medicine | Admitting: Internal Medicine

## 2015-06-23 ENCOUNTER — Encounter (HOSPITAL_COMMUNITY): Payer: Self-pay

## 2015-06-25 ENCOUNTER — Encounter (HOSPITAL_COMMUNITY)
Admission: RE | Admit: 2015-06-25 | Discharge: 2015-06-25 | Disposition: A | Discharge status: Home / Self Care | Payer: Self-pay | Source: Ambulatory Visit | Attending: Internal Medicine | Admitting: Internal Medicine

## 2015-06-25 ENCOUNTER — Encounter (HOSPITAL_COMMUNITY): Payer: Self-pay

## 2015-06-26 ENCOUNTER — Ambulatory Visit (INDEPENDENT_AMBULATORY_CARE_PROVIDER_SITE_OTHER): Payer: Medicare Other | Admitting: Pharmacist

## 2015-06-26 DIAGNOSIS — Z5181 Encounter for therapeutic drug level monitoring: Secondary | ICD-10-CM | POA: Diagnosis not present

## 2015-06-26 DIAGNOSIS — I4891 Unspecified atrial fibrillation: Secondary | ICD-10-CM | POA: Diagnosis not present

## 2015-06-26 DIAGNOSIS — Z7901 Long term (current) use of anticoagulants: Secondary | ICD-10-CM | POA: Diagnosis not present

## 2015-06-26 LAB — POCT INR: INR: 2.7

## 2015-06-27 ENCOUNTER — Encounter (HOSPITAL_COMMUNITY): Payer: Self-pay

## 2015-06-30 ENCOUNTER — Encounter (HOSPITAL_COMMUNITY): Payer: Self-pay

## 2015-07-02 ENCOUNTER — Encounter (HOSPITAL_COMMUNITY): Payer: Self-pay

## 2015-07-04 ENCOUNTER — Encounter (HOSPITAL_COMMUNITY): Payer: Self-pay

## 2015-07-07 ENCOUNTER — Encounter (HOSPITAL_COMMUNITY)
Admission: RE | Admit: 2015-07-07 | Payer: Self-pay | Source: Ambulatory Visit | Attending: Internal Medicine | Admitting: Internal Medicine

## 2015-07-07 ENCOUNTER — Telehealth (HOSPITAL_COMMUNITY): Payer: Self-pay | Admitting: *Deleted

## 2015-07-07 ENCOUNTER — Encounter (HOSPITAL_COMMUNITY): Payer: Self-pay

## 2015-07-09 ENCOUNTER — Encounter (HOSPITAL_COMMUNITY): Payer: Self-pay

## 2015-07-10 ENCOUNTER — Encounter: Payer: Self-pay | Admitting: *Deleted

## 2015-07-11 ENCOUNTER — Encounter (HOSPITAL_COMMUNITY): Payer: Self-pay

## 2015-07-14 ENCOUNTER — Encounter (HOSPITAL_COMMUNITY): Payer: Self-pay

## 2015-07-16 ENCOUNTER — Encounter (HOSPITAL_COMMUNITY): Payer: Self-pay

## 2015-07-17 ENCOUNTER — Emergency Department (HOSPITAL_COMMUNITY): Payer: Medicare Other

## 2015-07-17 ENCOUNTER — Observation Stay (HOSPITAL_COMMUNITY)
Admission: EM | Admit: 2015-07-17 | Discharge: 2015-07-21 | Disposition: A | Discharge status: Skilled Nursing Facility | Payer: Medicare Other | Attending: Internal Medicine | Admitting: Internal Medicine

## 2015-07-17 ENCOUNTER — Encounter (HOSPITAL_COMMUNITY): Payer: Self-pay

## 2015-07-17 DIAGNOSIS — Z880 Allergy status to penicillin: Secondary | ICD-10-CM | POA: Insufficient documentation

## 2015-07-17 DIAGNOSIS — Z85038 Personal history of other malignant neoplasm of large intestine: Secondary | ICD-10-CM | POA: Insufficient documentation

## 2015-07-17 DIAGNOSIS — R531 Weakness: Secondary | ICD-10-CM | POA: Insufficient documentation

## 2015-07-17 DIAGNOSIS — Z79899 Other long term (current) drug therapy: Secondary | ICD-10-CM | POA: Diagnosis not present

## 2015-07-17 DIAGNOSIS — I130 Hypertensive heart and chronic kidney disease with heart failure and stage 1 through stage 4 chronic kidney disease, or unspecified chronic kidney disease: Secondary | ICD-10-CM | POA: Diagnosis not present

## 2015-07-17 DIAGNOSIS — E1122 Type 2 diabetes mellitus with diabetic chronic kidney disease: Secondary | ICD-10-CM | POA: Diagnosis not present

## 2015-07-17 DIAGNOSIS — I2510 Atherosclerotic heart disease of native coronary artery without angina pectoris: Secondary | ICD-10-CM | POA: Diagnosis not present

## 2015-07-17 DIAGNOSIS — I872 Venous insufficiency (chronic) (peripheral): Secondary | ICD-10-CM | POA: Diagnosis not present

## 2015-07-17 DIAGNOSIS — I4891 Unspecified atrial fibrillation: Secondary | ICD-10-CM | POA: Insufficient documentation

## 2015-07-17 DIAGNOSIS — R5381 Other malaise: Secondary | ICD-10-CM | POA: Insufficient documentation

## 2015-07-17 DIAGNOSIS — I878 Other specified disorders of veins: Secondary | ICD-10-CM | POA: Diagnosis not present

## 2015-07-17 DIAGNOSIS — C4490 Unspecified malignant neoplasm of skin, unspecified: Secondary | ICD-10-CM | POA: Insufficient documentation

## 2015-07-17 DIAGNOSIS — Z951 Presence of aortocoronary bypass graft: Secondary | ICD-10-CM | POA: Diagnosis not present

## 2015-07-17 DIAGNOSIS — R062 Wheezing: Secondary | ICD-10-CM

## 2015-07-17 DIAGNOSIS — I5032 Chronic diastolic (congestive) heart failure: Secondary | ICD-10-CM | POA: Insufficient documentation

## 2015-07-17 DIAGNOSIS — Z794 Long term (current) use of insulin: Secondary | ICD-10-CM | POA: Diagnosis not present

## 2015-07-17 DIAGNOSIS — Z7901 Long term (current) use of anticoagulants: Secondary | ICD-10-CM | POA: Diagnosis not present

## 2015-07-17 DIAGNOSIS — D492 Neoplasm of unspecified behavior of bone, soft tissue, and skin: Secondary | ICD-10-CM | POA: Diagnosis present

## 2015-07-17 DIAGNOSIS — L97919 Non-pressure chronic ulcer of unspecified part of right lower leg with unspecified severity: Secondary | ICD-10-CM

## 2015-07-17 DIAGNOSIS — I83019 Varicose veins of right lower extremity with ulcer of unspecified site: Secondary | ICD-10-CM | POA: Diagnosis present

## 2015-07-17 DIAGNOSIS — L03115 Cellulitis of right lower limb: Principal | ICD-10-CM | POA: Diagnosis present

## 2015-07-17 HISTORY — DX: Malignant (primary) neoplasm, unspecified: C80.1

## 2015-07-17 HISTORY — DX: Heart failure, unspecified: I50.9

## 2015-07-17 LAB — COMPREHENSIVE METABOLIC PANEL
ALBUMIN: 3.1 g/dL — AB (ref 3.5–5.0)
ALT: 21 U/L (ref 17–63)
ANION GAP: 9 (ref 5–15)
AST: 38 U/L (ref 15–41)
Alkaline Phosphatase: 148 U/L — ABNORMAL HIGH (ref 38–126)
BUN: 29 mg/dL — AB (ref 6–20)
CHLORIDE: 101 mmol/L (ref 101–111)
CO2: 26 mmol/L (ref 22–32)
Calcium: 8.4 mg/dL — ABNORMAL LOW (ref 8.9–10.3)
Creatinine, Ser: 1.54 mg/dL — ABNORMAL HIGH (ref 0.61–1.24)
GFR calc Af Amer: 44 mL/min — ABNORMAL LOW (ref >60)
GFR, EST NON AFRICAN AMERICAN: 38 mL/min — AB (ref >60)
GLUCOSE: 191 mg/dL — AB (ref 65–99)
POTASSIUM: 3.3 mmol/L — AB (ref 3.5–5.1)
Sodium: 136 mmol/L (ref 135–145)
TOTAL PROTEIN: 7.4 g/dL (ref 6.5–8.1)
Total Bilirubin: 0.8 mg/dL (ref 0.3–1.2)

## 2015-07-17 LAB — CBC WITH DIFFERENTIAL/PLATELET
BASOS ABS: 0.1 10*3/uL (ref 0.0–0.1)
BASOS PCT: 1 %
EOS PCT: 1 %
Eosinophils Absolute: 0.1 10*3/uL (ref 0.0–0.7)
HCT: 41.1 % (ref 39.0–52.0)
Hemoglobin: 13 g/dL (ref 13.0–17.0)
Lymphocytes Relative: 21 %
Lymphs Abs: 2.1 10*3/uL (ref 0.7–4.0)
MCH: 29 pg (ref 26.0–34.0)
MCHC: 31.6 g/dL (ref 30.0–36.0)
MCV: 91.5 fL (ref 78.0–100.0)
MONO ABS: 1.2 10*3/uL — AB (ref 0.1–1.0)
MONOS PCT: 13 %
Neutro Abs: 6.3 10*3/uL (ref 1.7–7.7)
Neutrophils Relative %: 64 %
PLATELETS: 188 10*3/uL (ref 150–400)
RBC: 4.49 MIL/uL (ref 4.22–5.81)
RDW: 14.6 % (ref 11.5–15.5)
WBC: 9.7 10*3/uL (ref 4.0–10.5)

## 2015-07-17 LAB — BRAIN NATRIURETIC PEPTIDE: B NATRIURETIC PEPTIDE 5: 617.6 pg/mL — AB (ref 0.0–100.0)

## 2015-07-17 LAB — I-STAT CG4 LACTIC ACID, ED: LACTIC ACID, VENOUS: 2.26 mmol/L — AB (ref 0.5–1.9)

## 2015-07-17 MED ORDER — DIGOXIN 250 MCG PO TABS: 0.1250 ug | ORAL_TABLET | Freq: Every day | ORAL | Status: DC

## 2015-07-17 MED ORDER — INSULIN DETEMIR 100 UNIT/ML ~~LOC~~ SOLN
20.0000 [IU] | Freq: Every day | SUBCUTANEOUS | Status: DC
  Administered 2015-07-18 (×2): 20 [IU] via SUBCUTANEOUS
  Filled 2015-07-17 (×3): qty 0.2

## 2015-07-17 MED ORDER — ACETAMINOPHEN 80 MG PO CHEW: 500.0000 mg | CHEWABLE_TABLET | Freq: Four times a day (QID) | ORAL | Status: DC | PRN

## 2015-07-17 MED ORDER — ATORVASTATIN CALCIUM 10 MG PO TABS
20.0000 mg | ORAL_TABLET | Freq: Every day | ORAL | Status: DC
  Administered 2015-07-18 – 2015-07-20 (×3): 20 mg via ORAL
  Filled 2015-07-17 (×3): qty 2

## 2015-07-17 MED ORDER — RAMIPRIL 5 MG PO CAPS
5.0000 mg | ORAL_CAPSULE | Freq: Every day | ORAL | Status: DC
  Administered 2015-07-18 – 2015-07-19 (×2): 5 mg via ORAL
  Filled 2015-07-17 (×2): qty 1

## 2015-07-17 MED ORDER — FUROSEMIDE 20 MG PO TABS
60.0000 mg | ORAL_TABLET | Freq: Every day | ORAL | Status: DC
  Administered 2015-07-18 – 2015-07-19 (×2): 60 mg via ORAL
  Filled 2015-07-17 (×2): qty 3

## 2015-07-17 MED ORDER — VANCOMYCIN HCL IN DEXTROSE 1-5 GM/200ML-% IV SOLN
1000.0000 mg | Freq: Once | INTRAVENOUS | Status: AC
  Administered 2015-07-17: 1000 mg via INTRAVENOUS
  Filled 2015-07-17: qty 200

## 2015-07-17 MED ORDER — CLONIDINE HCL 0.2 MG PO TABS
0.4000 mg | ORAL_TABLET | Freq: Three times a day (TID) | ORAL | Status: DC
  Administered 2015-07-18 – 2015-07-21 (×9): 0.4 mg via ORAL
  Filled 2015-07-17 (×9): qty 2

## 2015-07-17 MED ORDER — SODIUM CHLORIDE 0.9 % IV SOLN
INTRAVENOUS | Status: AC
  Administered 2015-07-18: 02:00:00 via INTRAVENOUS

## 2015-07-17 MED ORDER — EXENATIDE 5 MCG/0.02ML ~~LOC~~ SOPN
5000.0000 ug | PEN_INJECTOR | Freq: Every day | SUBCUTANEOUS | Status: DC
  Administered 2015-07-18 – 2015-07-20 (×4): 5000 ug via SUBCUTANEOUS

## 2015-07-17 MED ORDER — METOPROLOL TARTRATE 25 MG PO TABS
75.0000 mg | ORAL_TABLET | Freq: Every day | ORAL | Status: DC
  Administered 2015-07-18 – 2015-07-19 (×2): 75 mg via ORAL
  Filled 2015-07-17 (×2): qty 3

## 2015-07-17 NOTE — ED Notes (Signed)
Attempted report x1. 

## 2015-07-17 NOTE — ED Notes (Signed)
Per Pt, Patient is coming from PCP. Pt has HX of swelling to the right leg, but has a wound noted to the right lower leg with serous drainage. Erythema and edema noted to bilateral legs. Hx of Diabetes, congestive heart failure.

## 2015-07-17 NOTE — ED Provider Notes (Signed)
CSN: [id number]     Arrival date & time 07/17/15  1450 History   First MD Initiated Contact with Patient 07/17/15 2122     Chief Complaint  Patient presents with  . Wound Infection     (Consider location/radiation/quality/duration/timing/severity/associated sxs/prior Treatment) The history is provided by the patient.  82 year old male brought in by his daughter from primary care office for swelling and wounds to the right leg. Patient is known to have chronic venous stasis and leg swelling. Primary care provider felt there was evidence of cellulitis and a skin ulcer on the right leg. Patient also has a history of congestive heart failure but denies any breathing problems a chest pain no belly pain. Patient has several lesions on his skin that he picks at frequently. They've never been able to figure out the cause is been evaluated several times in the past.  Past Medical History  Diagnosis Date  . A-fib ([place])   . Hypertension   . CAD (coronary artery disease)   . Diabetes mellitus   . CHF (congestive heart failure) ([place])   . Cancer [facility])     Colon   Past Surgical History  Procedure Laterality Date  . Cholecystectomy    . Coronary artery bypass graft    . Pacemaker insertion    . Cardiac catheterization  2001, 2004  . Cataract extraction  2000   Family History  Problem Relation Age of Onset  . Diabetes Mother   . Coronary artery disease Father   . Diabetes Sister    Social History  Substance Use Topics  . Smoking status: Never Smoker   . Smokeless tobacco: None  . Alcohol Use: No    Review of Systems  Constitutional: Negative for fever.  HENT: Negative for congestion.   Eyes: Negative for visual disturbance.  Respiratory: Negative for shortness of breath.   Cardiovascular: Positive for leg swelling. Negative for chest pain.  Gastrointestinal: Negative for abdominal pain.  Genitourinary: Negative for dysuria.  Musculoskeletal: Negative for back pain and neck pain.   Skin: Positive for rash and wound.  Neurological: Negative for headaches.  Hematological: Does not bruise/bleed easily.  Psychiatric/Behavioral: Negative for confusion.      Allergies  Penicillins  Home Medications   Prior to Admission medications   Medication Sig Start Date End Date Taking? Authorizing Provider  acetaminophen (TYLENOL) 500 MG chewable tablet Chew 500 mg by mouth every 6 (six) hours as needed. For pain   Yes Historical Provider, MD  atorvastatin (LIPITOR) 40 MG tablet Take 20 mg by mouth daily.    Yes Historical Provider, MD  Calcium Carbonate Antacid (MAALOX) 600 MG chewable tablet Chew 600 mg by mouth as needed.   Yes Historical Provider, MD  clindamycin (CLEOCIN) 150 MG capsule Take by mouth. Take 4 capsules 1 hour before dentist   Yes Historical Provider, MD  cloNIDine (CATAPRES) 0.2 MG tablet TAKE 2 TABLETS BY MOUTH THREE TIMES DAILY 07/23/14  Yes Evans Lance, MD  cyanocobalamin 100 MCG tablet Take 100 mcg by mouth daily.   Yes Historical Provider, MD  digoxin (LANOXIN) 0.25 MG tablet Take 0.125 mcg by mouth daily.    Yes Historical Provider, MD  Exenatide (BYETTA 5 MCG PEN [place]) Inject 5 mg into the skin daily.    Yes Historical Provider, MD  furosemide (LASIX) 40 MG tablet Take 60 mg by mouth daily. Take 1 and 1/2 daily   Yes Historical Provider, MD  insulin detemir (LEVEMIR) 100 UNIT/ML injection Inject 20  Units into the skin at bedtime.   Yes Historical Provider, MD  metoprolol (LOPRESSOR) 50 MG tablet Take 75 mg by mouth daily. Verified with patient it is not the XL   Yes Historical Provider, MD  nitroGLYCERIN (NITROSTAT) 0.4 MG SL tablet Place 0.4 mg under the tongue every 5 (five) minutes as needed.     Yes Historical Provider, MD  ramipril (ALTACE) 5 MG capsule Take 5 mg by mouth daily.     Yes Historical Provider, MD  warfarin (COUMADIN) 5 MG tablet Take 2.5-5 mg by mouth See admin instructions. Take 5mg  every day except on Mon, Wed, Fri and Saturday take  2.5mg    Yes Historical Provider, MD   BP 176/78 mmHg  Pulse 64  Temp(Src) 98.5 F (36.9 C) (Oral)  Resp 17  SpO2 95% Physical Exam  Constitutional: He appears well-developed and well-nourished. No distress.  HENT:  Head: Normocephalic and atraumatic.  Mouth/Throat: Oropharynx is clear and moist.  Eyes: Conjunctivae and EOM are normal. Pupils are equal, round, and reactive to light.  Neck: Normal range of motion. Neck supple.  Cardiovascular: Normal rate, regular rhythm and normal heart sounds.   Pulmonary/Chest: Effort normal and breath sounds normal. No respiratory distress.  Abdominal: Soft. Bowel sounds are normal. There is no tenderness.  Musculoskeletal: He exhibits edema.  Patient with chronic skin changes due to venous stasis and not both legs. Edema to both. Increased edema to the right foot. Right anterior shin with an area of the skin peeling off not through the dermis measuring about the 4 x 4 centimeters. More proximal there is a breakdown of the skin that spherical more suggestive of an ulcer with surrounding erythema and increased warmth.  Neurological: He is alert. No cranial nerve deficit. He exhibits normal muscle tone. Coordination normal.  Skin: Skin is warm. There is erythema.  Patient was scattered scabs that seem to be area worries done some picking predominantly on his left thigh area. No secondary infection. Patient with a about 1 x 1 cm lesion on the edge of the pinna of his right ear. That very well may represent a basal cell carcinoma.  Nursing note and vitals reviewed.   ED Course  Procedures (including critical care time) Labs Review Labs Reviewed  CBC WITH DIFFERENTIAL/PLATELET - Abnormal; Notable for the following:    Monocytes Absolute 1.2 (*)    All other components within normal limits  COMPREHENSIVE METABOLIC PANEL - Abnormal; Notable for the following:    Potassium 3.3 (*)    Glucose, Bld 191 (*)    BUN 29 (*)    Creatinine, Ser 1.54 (*)     Calcium 8.4 (*)    Albumin 3.1 (*)    Alkaline Phosphatase 148 (*)    GFR calc non Af Amer 38 (*)    GFR calc Af Amer 44 (*)    All other components within normal limits  BRAIN NATRIURETIC PEPTIDE - Abnormal; Notable for the following:    B Natriuretic Peptide 617.6 (*)    All other components within normal limits  I-STAT CG4 LACTIC ACID, ED - Abnormal; Notable for the following:    Lactic Acid, Venous 2.26 (*)    All other components within normal limits  PROTIME-INR   Results for orders placed or performed during the hospital encounter of 07/17/15  CBC with Differential  Result Value Ref Range   WBC 9.7 4.0 - 10.5 K/uL   RBC 4.49 4.22 - 5.81 MIL/uL   Hemoglobin 13.0 13.0 -  17.0 g/dL   HCT 41.1 39.0 - 52.0 %   MCV 91.5 78.0 - 100.0 fL   MCH 29.0 26.0 - 34.0 pg   MCHC 31.6 30.0 - 36.0 g/dL   RDW 14.6 11.5 - 15.5 %   Platelets 188 150 - 400 K/uL   Neutrophils Relative % 64 %   Neutro Abs 6.3 1.7 - 7.7 K/uL   Lymphocytes Relative 21 %   Lymphs Abs 2.1 0.7 - 4.0 K/uL   Monocytes Relative 13 %   Monocytes Absolute 1.2 (H) 0.1 - 1.0 K/uL   Eosinophils Relative 1 %   Eosinophils Absolute 0.1 0.0 - 0.7 K/uL   Basophils Relative 1 %   Basophils Absolute 0.1 0.0 - 0.1 K/uL  Comprehensive metabolic panel  Result Value Ref Range   Sodium 136 135 - 145 mmol/L   Potassium 3.3 (L) 3.5 - 5.1 mmol/L   Chloride 101 101 - 111 mmol/L   CO2 26 22 - 32 mmol/L   Glucose, Bld 191 (H) 65 - 99 mg/dL   BUN 29 (H) 6 - 20 mg/dL   Creatinine, Ser 1.54 (H) 0.61 - 1.24 mg/dL   Calcium 8.4 (L) 8.9 - 10.3 mg/dL   Total Protein 7.4 6.5 - 8.1 g/dL   Albumin 3.1 (L) 3.5 - 5.0 g/dL   AST 38 15 - 41 U/L   ALT 21 17 - 63 U/L   Alkaline Phosphatase 148 (H) 38 - 126 U/L   Total Bilirubin 0.8 0.3 - 1.2 mg/dL   GFR calc non Af Amer 38 (L) >60 mL/min   GFR calc Af Amer 44 (L) >60 mL/min   Anion gap 9 5 - 15  Brain natriuretic peptide  Result Value Ref Range   B Natriuretic Peptide 617.6 (H) 0.0 - 100.0  pg/mL  I-Stat CG4 Lactic Acid, ED  Result Value Ref Range   Lactic Acid, Venous 2.26 (HH) 0.5 - 1.9 mmol/L   Comment NOTIFIED PHYSICIAN    *Note: Due to a large number of results and/or encounters for the requested time period, some results have not been displayed. A complete set of results can be found in Results Review.     Imaging Review Dg Chest Port 1 View  07/17/2015  CLINICAL DATA:  Cough and congestion for 1 week. EXAM: PORTABLE CHEST 1 VIEW COMPARISON:  02/16/2011 FINDINGS: Intact appearances of the transvenous cardiac leads. The lungs are clear. The pulmonary vasculature is normal. There is no large effusion. Hilar and mediastinal contours are unremarkable and unchanged. IMPRESSION: No acute cardiopulmonary findings. Electronically Signed   By: Andreas Newport M.D.   On: 07/17/2015 22:55   I have personally reviewed and evaluated these images and lab results as part of my medical decision-making.   EKG Interpretation None      MDM   Final diagnoses:  Cellulitis of right lower extremity    Patient referred in by primary care doctor after being seen today for skin breakdown of the right anterior shin worse large blisters peeled off. As well as is a proximal wound with cellulitis. Suggestive of an ulcer. Patient's had chronic venous stasis in bilateral leg swelling. Patient in the past as been followed by wound care daughter states that when he was followed by them the legs were in the best shape. But he's let himself go. Daughter recognized problems to the primary care doctor today and patient was referred here. Its findings consistent with a cellulitis of the right leg. Chronic skin changes on  both legs. Patient chest x-ray without evidence of CHF. No significant leukocytosis no fever not tachycardic not hypotensive lactic acid is elevated. But is less than 4. No significant electrolyte abnormalities other than a mild elevation in creatinine and BUN and and very mild  hypokalemia. Patient started on vancomycin here will require wound treatment. Will be admitted by the hospitalist team.    Fredia Sorrow, MD 07/18/15 [phone]

## 2015-07-18 ENCOUNTER — Encounter (HOSPITAL_COMMUNITY): Payer: Self-pay

## 2015-07-18 DIAGNOSIS — I83019 Varicose veins of right lower extremity with ulcer of unspecified site: Secondary | ICD-10-CM

## 2015-07-18 DIAGNOSIS — L03115 Cellulitis of right lower limb: Secondary | ICD-10-CM | POA: Diagnosis not present

## 2015-07-18 DIAGNOSIS — D492 Neoplasm of unspecified behavior of bone, soft tissue, and skin: Secondary | ICD-10-CM | POA: Diagnosis present

## 2015-07-18 DIAGNOSIS — D485 Neoplasm of uncertain behavior of skin: Secondary | ICD-10-CM | POA: Diagnosis not present

## 2015-07-18 LAB — GLUCOSE, CAPILLARY: GLUCOSE-CAPILLARY: 167 mg/dL — AB (ref 65–99)

## 2015-07-18 LAB — PROTIME-INR
INR: 2.36 — AB (ref 0.00–1.49)
INR: 2.38 — AB (ref 0.00–1.49)
Prothrombin Time: 25.5 seconds — ABNORMAL HIGH (ref 11.6–15.2)
Prothrombin Time: 25.7 seconds — ABNORMAL HIGH (ref 11.6–15.2)

## 2015-07-18 MED ORDER — AQUAPHOR EX OINT
TOPICAL_OINTMENT | Freq: Every day | CUTANEOUS | Status: DC | PRN
  Filled 2015-07-18: qty 50

## 2015-07-18 MED ORDER — ACETAMINOPHEN 500 MG PO TABS
500.0000 mg | ORAL_TABLET | Freq: Four times a day (QID) | ORAL | Status: DC | PRN
  Administered 2015-07-19: 500 mg via ORAL
  Filled 2015-07-18: qty 1

## 2015-07-18 MED ORDER — WARFARIN SODIUM 5 MG PO TABS
2.5000 mg | ORAL_TABLET | ORAL | Status: DC
  Administered 2015-07-18 – 2015-07-19 (×2): 2.5 mg via ORAL
  Filled 2015-07-18: qty 0.5
  Filled 2015-07-18 (×2): qty 1

## 2015-07-18 MED ORDER — DEXTROSE 5 % IV SOLN
1.0000 g | Freq: Every day | INTRAVENOUS | Status: DC
  Administered 2015-07-18 – 2015-07-20 (×4): 1 g via INTRAVENOUS
  Filled 2015-07-18 (×5): qty 10

## 2015-07-18 MED ORDER — DIGOXIN 125 MCG PO TABS
0.1250 mg | ORAL_TABLET | Freq: Every day | ORAL | Status: DC
  Administered 2015-07-18 – 2015-07-21 (×4): 0.125 mg via ORAL
  Filled 2015-07-18 (×4): qty 1

## 2015-07-18 MED ORDER — WARFARIN - PHARMACIST DOSING INPATIENT
Freq: Every day | Status: DC
  Administered 2015-07-18 – 2015-07-20 (×3)

## 2015-07-18 MED ORDER — WARFARIN SODIUM 5 MG PO TABS
5.0000 mg | ORAL_TABLET | ORAL | Status: DC
  Administered 2015-07-18 – 2015-07-20 (×2): 5 mg via ORAL
  Filled 2015-07-18 (×2): qty 1

## 2015-07-18 NOTE — Progress Notes (Signed)
Orthopedic Tech Progress Note Patient Details:  Oscar Bruce 1924-11-06 [id number]  Ortho Devices Type of Ortho Device: Louretta Parma boot Ortho Device/Splint Location: bilateral Ortho Device/Splint Interventions: Application   Audrielle Vankuren 07/18/2015, 10:26 AM

## 2015-07-18 NOTE — Progress Notes (Signed)
Patient seen and examined this morning, admitted overnight by Dr. Alcario Drought, agree with H&P  In brief 82 yo M with DM, CHFpEF, chronic venous insufficiency admitted with LE ulcers and erythema c/w cellulitis.   RLE cellulitis / wounds - wound RN consulted, appreciate input - Unna boot - continue IV antibiotics  Probable skin cancer - no derm available, outpatient management. Its been present for several months  DM  - continue home meds  A fib / PPM - continue Coumadin   Oscar Bruce M. Cruzita Lederer, MD [facility] [phone]

## 2015-07-18 NOTE — Progress Notes (Signed)
ANTICOAGULATION CONSULT NOTE - Initial Consult  Pharmacy Consult for Coumadin Indication: atrial fibrillation  Allergies  Allergen Reactions  . Penicillins Swelling    Has patient had a PCN reaction causing immediate rash, facial/tongue/throat swelling, SOB or lightheadedness with hypotension: NO Has patient had a PCN reaction causing severe rash involving mucus membranes or skin necrosis: NO Has patient had a PCN reaction that required hospitalization NO Has patient had a PCN reaction occurring within the last 10 years: NO If all of the above answers are "NO", then may proceed with Cephalosporin use.    Vital Signs: Temp: 98.5 F (36.9 C) (06/29 1705) Temp Source: Oral (06/29 1705) BP: 175/70 mmHg (06/30 0053) Pulse Rate: 67 (06/30 0053)  Labs:  Recent Labs  07/17/15 2200 07/17/15 2344  HGB 13.0  --   HCT 41.1  --   PLT 188  --   LABPROT  --  25.7*  INR  --  2.38*  CREATININE 1.54*  --      Medical History: Past Medical History  Diagnosis Date  . A-fib ([place])   . Hypertension   . CAD (coronary artery disease)   . Diabetes mellitus   . CHF (congestive heart failure) ([place])   . Cancer [facility])     Colon    Medications:  Prescriptions prior to admission  Medication Sig Dispense Refill Last Dose  . acetaminophen (TYLENOL) 500 MG chewable tablet Chew 500 mg by mouth every 6 (six) hours as needed. For pain   07/16/2015 at Unknown time  . atorvastatin (LIPITOR) 40 MG tablet Take 20 mg by mouth daily.    07/17/2015 at Unknown time  . Calcium Carbonate Antacid (MAALOX) 600 MG chewable tablet Chew 600 mg by mouth as needed.   Past Month at Unknown time  . clindamycin (CLEOCIN) 150 MG capsule Take by mouth. Take 4 capsules 1 hour before dentist   >64months ago at Unknown time  . cloNIDine (CATAPRES) 0.2 MG tablet TAKE 2 TABLETS BY MOUTH THREE TIMES DAILY 180 tablet 9 07/17/2015 at Unknown time  . cyanocobalamin 100 MCG tablet Take 100 mcg by mouth daily.   07/17/2015 at Unknown  time  . digoxin (LANOXIN) 0.25 MG tablet Take 0.125 mg by mouth daily.    07/17/2015 at Unknown time  . Exenatide (BYETTA 5 MCG PEN [place]) Inject 5 mg into the skin daily.    07/17/2015 at Unknown time  . furosemide (LASIX) 40 MG tablet Take 60 mg by mouth daily. Take 1 and 1/2 daily   07/17/2015 at Unknown time  . insulin detemir (LEVEMIR) 100 UNIT/ML injection Inject 20 Units into the skin at bedtime.   07/16/2015 at Unknown time  . metoprolol (LOPRESSOR) 50 MG tablet Take 75 mg by mouth daily. Verified with patient it is not the XL   07/17/2015 at 0800  . nitroGLYCERIN (NITROSTAT) 0.4 MG SL tablet Place 0.4 mg under the tongue every 5 (five) minutes as needed.     unknown at unknown  . ramipril (ALTACE) 5 MG capsule Take 5 mg by mouth daily.     07/17/2015 at Unknown time  . warfarin (COUMADIN) 5 MG tablet Take 2.5-5 mg by mouth See admin instructions. Take 5mg  every day except on Mon, Wed, Fri and Saturday take 2.5mg    07/16/2015 at 1700   Scheduled:  . sodium chloride   Intravenous STAT  . atorvastatin  20 mg Oral q1800  . cefTRIAXone (ROCEPHIN)  IV  1 g Intravenous QHS  . cloNIDine  0.4 mg Oral TID  . digoxin  0.125 mg Oral Daily  . exenatide  5,000 mcg Subcutaneous Daily  . furosemide  60 mg Oral Daily  . insulin detemir  20 Units Subcutaneous QHS  . metoprolol  75 mg Oral Daily  . ramipril  5 mg Oral Daily    Assessment: 82yo male presents w/ draining wound to RLE, admitted w/ cellulitis, to continue Coumadin for Afib; current INR at goal w/ last dose of Coumadin taken 6/28.  Goal of Therapy:  INR 2-3   Plan:  Will continue home Coumadin dose of 2.5mg  MWFSat and 5mg  TTSun and monitor INR.  Wynona Neat, PharmD, BCPS  07/18/2015,1:00 AM

## 2015-07-18 NOTE — Care Management Obs Status (Signed)
[place] NOTIFICATION   Patient Details  Name: Oscar Bruce MRN: [id number] Date of Birth: [birth year]   Medicare Observation Status Notification Given:  Yes    Marilu Favre, RN 07/18/2015, 9:51 AM

## 2015-07-18 NOTE — Care Management Note (Signed)
Case Management Note  Patient Details  Name: DERIK SCHERFF MRN: [id number] Date of Birth: 31-Mar-1924  Subjective/Objective:                    Action/Plan: Await PT OT evals , see recommendation for [facility]  Unna boots . Confirmed face sheet information with patient . Discussed home health . Left list of Home Health agencies with patient . Will follow.   Expected Discharge Date:  07/19/15               Expected Discharge Plan:  [place]  In-House Referral:     Discharge planning Services     Post Acute Care Choice:    Choice offered to:  Patient  DME Arranged:    DME Agency:     HH Arranged:    [place] Agency:     Status of Service:  In process, will continue to follow  If discussed at Long Length of Stay Meetings, dates discussed:    Additional Comments:  Marilu Favre, RN 07/18/2015, 9:52 AM

## 2015-07-18 NOTE — H&P (Signed)
History and Physical    Oscar Bruce [id number] DOB: 11-07-1924 DOA: 07/17/2015   PCP: Horatio Pel, MD Chief Complaint:  Chief Complaint  Patient presents with  . Wound Infection    HPI: Oscar Bruce is a 82 y.o. male with medical history significant of DM, diastolic CHF, venous stasis ulcers of legs.  Patient presents to the ED with c/o erythema, skin tear of RLE shin.  Surrounding erythema.  Symptoms onset a couple of days ago, have been persistent.  Associated serous drainage, hasnt been on any ABx recently for this.  H/o multiple bouts of cellulitis like this in the past.  Review of Systems: Skin lesions on back have just popped up in the last couple of days, has skin lesions of left thigh that have been present for years, tested and non-cancerous.  Has a skin lesion of his R ear that I noticed on exam that has been there for a couple of months, has not been tested.  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 As per HPI otherwise 10 point review of systems negative.    Past Medical History  Diagnosis Date  . A-fib ([place])   . Hypertension   . CAD (coronary artery disease)   . Diabetes mellitus   . CHF (congestive heart failure) ([place])   . Cancer [facility])     Colon    Past Surgical History  Procedure Laterality Date  . Cholecystectomy    . Coronary artery bypass graft    . Pacemaker insertion    . Cardiac catheterization  2001, 2004  . Cataract extraction  2000     reports that he has never smoked. He does not have any smokeless tobacco history on file. He reports that he does not drink alcohol or use illicit drugs.  Allergies  Allergen Reactions  . Penicillins Swelling    Has patient had a PCN reaction causing immediate rash, facial/tongue/throat swelling, SOB or lightheadedness with hypotension: NO Has patient had a PCN  reaction causing severe rash involving mucus membranes or skin necrosis: NO Has patient had a PCN reaction that required hospitalization NO Has patient had a PCN reaction occurring within the last 10 years: NO If all of the above answers are "NO", then may proceed with Cephalosporin use.    Family History  Problem Relation Age of Onset  . Diabetes Mother   . Coronary artery disease Father   . Diabetes Sister       Prior to Admission medications   Medication Sig Start Date End Date Taking? Authorizing Provider  acetaminophen (TYLENOL) 500 MG chewable tablet Chew 500 mg by mouth every 6 (six) hours as needed. For pain   Yes Historical Provider, MD  atorvastatin (LIPITOR) 40 MG tablet Take 20 mg by mouth daily.    Yes Historical Provider, MD  Calcium Carbonate Antacid (MAALOX) 600 MG chewable tablet Chew 600 mg by mouth as needed.   Yes Historical Provider, MD  clindamycin (CLEOCIN) 150 MG capsule Take by mouth. Take 4 capsules 1 hour before dentist   Yes Historical Provider, MD  cloNIDine (CATAPRES) 0.2 MG tablet TAKE 2 TABLETS BY MOUTH THREE TIMES DAILY 07/23/14  Yes Evans Lance, MD  cyanocobalamin 100 MCG tablet Take 100 mcg by mouth daily.   Yes Historical Provider, MD  digoxin (LANOXIN) 0.25 MG tablet Take 0.125 mcg by mouth  daily.    Yes Historical Provider, MD  Exenatide (BYETTA 5 MCG PEN McConnell) Inject 5 mg into the skin daily.    Yes Historical Provider, MD  furosemide (LASIX) 40 MG tablet Take 60 mg by mouth daily. Take 1 and 1/2 daily   Yes Historical Provider, MD  insulin detemir (LEVEMIR) 100 UNIT/ML injection Inject 20 Units into the skin at bedtime.   Yes Historical Provider, MD  metoprolol (LOPRESSOR) 50 MG tablet Take 75 mg by mouth daily. Verified with patient it is not the XL   Yes Historical Provider, MD  nitroGLYCERIN (NITROSTAT) 0.4 MG SL tablet Place 0.4 mg under the tongue every 5 (five) minutes as needed.     Yes Historical Provider, MD  ramipril (ALTACE) 5 MG capsule  Take 5 mg by mouth daily.     Yes Historical Provider, MD  warfarin (COUMADIN) 5 MG tablet Take 2.5-5 mg by mouth See admin instructions. Take 5mg  every day except on Mon, Wed, Fri and Saturday take 2.5mg    Yes Historical Provider, MD    Physical Exam: Filed Vitals:   07/17/15 2300 07/17/15 2315 07/17/15 2330 07/17/15 2345  BP: 165/66 150/67 188/88 176/78  Pulse: 59 55 65 64  Temp:      TempSrc:      Resp: 16 13 25 17   SpO2: 96% 97% 97% 95%      Constitutional: NAD, calm, comfortable Eyes: PERRL, lids and conjunctivae normal ENMT: Mucous membranes are moist. Posterior pharynx clear of any exudate or lesions.Normal dentition.  Neck: normal, supple, no masses, no thyromegaly Respiratory: clear to auscultation bilaterally, no wheezing, no crackles. Normal respiratory effort. No accessory muscle use.  Cardiovascular: Regular rate and rhythm, no murmurs / rubs / gallops. No extremity edema. 2+ pedal pulses. No carotid bruits.  Abdomen: no tenderness, no masses palpated. No hepatosplenomegaly. Bowel sounds positive.  Musculoskeletal: no clubbing / cyanosis. No joint deformity upper and lower extremities. Good ROM, no contractures. Normal muscle tone.  Skin: Cellulitis of RLE with venous stasis ulcer.  Lesions on back, and left thigh noted.  R ear tip lesion is highly worrisome in appearance for neoplasm! Neurologic: CN 2-12 grossly intact. Sensation intact, DTR normal. Strength 5/5 in all 4.  Psychiatric: Normal judgment and insight. Alert and oriented x 3. Normal mood.    Labs on Admission: I have personally reviewed following labs and imaging studies  CBC:  Recent Labs Lab 07/17/15 2200  WBC 9.7  NEUTROABS 6.3  HGB 13.0  HCT 41.1  MCV 91.5  PLT 0000000   Basic Metabolic Panel:  Recent Labs Lab 07/17/15 2200  NA 136  K 3.3*  CL 101  CO2 26  GLUCOSE 191*  BUN 29*  CREATININE 1.54*  CALCIUM 8.4*   GFR: CrCl cannot be calculated (Unknown ideal weight.). Liver Function  Tests:  Recent Labs Lab 07/17/15 2200  AST 38  ALT 21  ALKPHOS 148*  BILITOT 0.8  PROT 7.4  ALBUMIN 3.1*   No results for input(s): LIPASE, AMYLASE in the last 168 hours. No results for input(s): AMMONIA in the last 168 hours. Coagulation Profile: No results for input(s): INR, PROTIME in the last 168 hours. Cardiac Enzymes: No results for input(s): CKTOTAL, CKMB, CKMBINDEX, TROPONINI in the last 168 hours. BNP (last 3 results) No results for input(s): PROBNP in the last 8760 hours. HbA1C: No results for input(s): HGBA1C in the last 72 hours. CBG: No results for input(s): GLUCAP in the last 168 hours. Lipid Profile: No results  for input(s): CHOL, HDL, LDLCALC, TRIG, CHOLHDL, LDLDIRECT in the last 72 hours. Thyroid Function Tests: No results for input(s): TSH, T4TOTAL, FREET4, T3FREE, THYROIDAB in the last 72 hours. Anemia Panel: No results for input(s): VITAMINB12, FOLATE, FERRITIN, TIBC, IRON, RETICCTPCT in the last 72 hours. Urine analysis: No results found for: COLORURINE, APPEARANCEUR, LABSPEC, PHURINE, GLUCOSEU, HGBUR, BILIRUBINUR, KETONESUR, PROTEINUR, UROBILINOGEN, NITRITE, LEUKOCYTESUR Sepsis Labs: @LABRCNTIP (procalcitonin:4,lacticidven:4) )No results found for this or any previous visit (from the past 240 hour(s)).   Radiological Exams on Admission: Dg Chest Port 1 View  07/17/2015  CLINICAL DATA:  Cough and congestion for 1 week. EXAM: PORTABLE CHEST 1 VIEW COMPARISON:  02/16/2011 FINDINGS: Intact appearances of the transvenous cardiac leads. The lungs are clear. The pulmonary vasculature is normal. There is no large effusion. Hilar and mediastinal contours are unremarkable and unchanged. IMPRESSION: No acute cardiopulmonary findings. Electronically Signed   By: Andreas Newport M.D.   On: 07/17/2015 22:55    EKG: Independently reviewed.  Assessment/Plan Principal Problem:   Cellulitis of right lower leg Active Problems:   Venous stasis ulcer of right lower  extremity (HCC)   Neoplasm of skin of ear   Cellulitis of RLE - and associated venous stasis wounds  Rocephin  Wound care consult  Neoplasm of skin of ear - highly worrisome in appearance for a malignant skin neoplasm! Additionally he has numerous risk factors including heavy history of sun exposure, personal history of colon cancer previously, and likely history of radiation and nuclear fallout exposure in the 95s.  Expressed to him and daughter that this absolutely needs to be looked at if they dont do it inpatient tomorrow and expressed my concerns as to what I thought it was.  Unsure if we can get derm consult inpatient in the morning?  Or if not perhaps plastic surgery to see for biopsy / resection if warranted?  DM - continue home meds  DVT prophylaxis: On coumadin for A.Fib Code Status: Full Family Communication: Daughter at bedside Consults called: None Admission status: Admit to obs   [place], [place] [facility] Pager [phone] from 7PM-7AM  If 7AM-7PM, please contact the day physician for the patient [email or web address] Password [email or web address]  07/18/2015, 12:03 AM

## 2015-07-18 NOTE — Consult Note (Signed)
WOC wound consult note Reason for Consult: venous stasis dx. Patient admitted with worsening LE edema and weeping. Daughter at bedside with patient, they both provide history.  Patient has been wearing compression stockings at home but sometimes has difficulty with donning stockings. Daughter came to check on patient and reported worsening edema and blistering, she contacted the wound care center however they were not able to see patient until next week. She then took the patient into his primary care MD who sent patient to ED for admission for abtx.  Patient is noted to have weeping with 3+ edema, ruptured bulla on the RLE Intact bulla on the pretibial area of the LLE. Patient and daughter also requested me to look at multiple lesions on the left inner thigh, right upper back. Unclear etiology, patient reports "bugs" and describes them as 1/8" long. Daughter reports they have "been down this road before" and she does not think they are bedbugs.  He as been seen by dermatologist.  Patient self reports he scratches the areas, excoriation is evident on the left inner thigh.  Patient reports that he has to pick the areas/scratch the area or they just "get out of control" He is also noted to have a lesion on the right ear, very concerning for cancerous lesion.  Would recommend follow up for this with dermatologist as outpatient, dermatologist does not come into the hospital.  Wound type: 1. Venous stasis with weeping 2. Unknown etiology partial thickness lesions left inner thigh and right upper back ? Insects? 3. ? Cancerous lesion right ear   Measurement: RLE distal partial thickness unroofed bulla; 7cm x 8cm x 0.1cm; RLE proximal pretibial, partial thickness 1.0 cm x 1.0cm x 0.1cm  Right ear: 1.0cm x 0.5cm raised lesion Multiple lesions left inner thigh and right upper shoulder most approximately 0.5cm x 0.5cm x0.1cm  Wound bed:  RLE all are clean, moderate serous drainage, no odor Other areas are  also clean, not open, not draining Drainage (amount, consistency, odor) see above Periwound: intact, LE with 3" edema, redness, venous stasis dermatitis Dressing procedure/placement/frequency: Topical wound care to the LE, RLE with silver antimicrobial and foam. LLE with foam only.  Compression with Unna's boots bilaterally since patient is ambulatory.  Elevate LE when possible.  Aquaphor and hydrocortisone for thigh and back  Dermatology to follow these areas and right ear lesion Will need HHRN for a few weeks to change Unna's boots and provide topical wound care.  Patient's daughter has already scheduled patient an appointment in the wound care center. Patient's daughter is tearful because she is planning to travel to [place] on Tuesday and she does not want to leave her father without the care he needs.  HHRN can supplement care while she is away.   May benefit from HHPT evaluation for safety, as the daughter reports he is a little less mobile than he has been.   Notified hospitalist of above needs. Bedside nurse to page ortho tech to apply Unna's boots when supplies arrive to the floor and after she provides topical care.   Discussed POC with patient and bedside nurse.  Re consult if needed, will not follow at this time. Thanks  Marilyn Wing [employer], RN,CNS, [place] [phone])

## 2015-07-19 DIAGNOSIS — L03115 Cellulitis of right lower limb: Secondary | ICD-10-CM | POA: Diagnosis not present

## 2015-07-19 LAB — GLUCOSE, CAPILLARY
GLUCOSE-CAPILLARY: 161 mg/dL — AB (ref 65–99)
Glucose-Capillary: 176 mg/dL — ABNORMAL HIGH (ref 65–99)

## 2015-07-19 LAB — BASIC METABOLIC PANEL
Anion gap: 9 (ref 5–15)
BUN: 35 mg/dL — ABNORMAL HIGH (ref 6–20)
CHLORIDE: 100 mmol/L — AB (ref 101–111)
CO2: 28 mmol/L (ref 22–32)
CREATININE: 1.66 mg/dL — AB (ref 0.61–1.24)
Calcium: 8.3 mg/dL — ABNORMAL LOW (ref 8.9–10.3)
GFR, EST AFRICAN AMERICAN: 41 mL/min — AB (ref >60)
GFR, EST NON AFRICAN AMERICAN: 35 mL/min — AB (ref >60)
Glucose, Bld: 63 mg/dL — ABNORMAL LOW (ref 65–99)
Potassium: 3.6 mmol/L (ref 3.5–5.1)
SODIUM: 137 mmol/L (ref 135–145)

## 2015-07-19 LAB — CBC
HCT: 38.1 % — ABNORMAL LOW (ref 39.0–52.0)
HEMOGLOBIN: 12.1 g/dL — AB (ref 13.0–17.0)
MCH: 29 pg (ref 26.0–34.0)
MCHC: 31.8 g/dL (ref 30.0–36.0)
MCV: 91.4 fL (ref 78.0–100.0)
PLATELETS: 173 10*3/uL (ref 150–400)
RBC: 4.17 MIL/uL — AB (ref 4.22–5.81)
RDW: 14.6 % (ref 11.5–15.5)
WBC: 11 10*3/uL — ABNORMAL HIGH (ref 4.0–10.5)

## 2015-07-19 LAB — PROTIME-INR
INR: 2.33 — AB (ref 0.00–1.49)
PROTHROMBIN TIME: 25.3 s — AB (ref 11.6–15.2)

## 2015-07-19 MED ORDER — METOPROLOL SUCCINATE ER 50 MG PO TB24
75.0000 mg | ORAL_TABLET | Freq: Every day | ORAL | Status: DC
  Administered 2015-07-20 – 2015-07-21 (×2): 75 mg via ORAL
  Filled 2015-07-19 (×2): qty 1

## 2015-07-19 MED ORDER — INSULIN ASPART 100 UNIT/ML ~~LOC~~ SOLN
0.0000 [IU] | Freq: Three times a day (TID) | SUBCUTANEOUS | Status: DC
  Administered 2015-07-19: 2 [IU] via SUBCUTANEOUS
  Administered 2015-07-20: 1 [IU] via SUBCUTANEOUS
  Administered 2015-07-20: 2 [IU] via SUBCUTANEOUS
  Administered 2015-07-21: 1 [IU] via SUBCUTANEOUS

## 2015-07-19 MED ORDER — INSULIN DETEMIR 100 UNIT/ML ~~LOC~~ SOLN
18.0000 [IU] | Freq: Every day | SUBCUTANEOUS | Status: DC
  Administered 2015-07-19: 18 [IU] via SUBCUTANEOUS
  Filled 2015-07-19 (×2): qty 0.18

## 2015-07-19 NOTE — Evaluation (Signed)
Physical Therapy Evaluation Patient Details Name: Oscar Bruce MRN: [id number] DOB: 1924/07/31 Today's Date: 07/19/2015   History of Present Illness  82 y.o. male with medical history significant of DM, diastolic CHF, and venous stasis ulcers of legs.He presented to the ED with wound R distal LE. Pt admitted with dx of RLE cellulitis.   Clinical Impression  Pt admitted with above diagnosis. Pt currently with functional limitations due to the deficits listed below (see PT Problem List). On eval, pt required min assist with transfers and min guard assist 50 feet with RW. Pt retropulsive with initial stance requiring increased time and min assist to stabilize. PTA, pt was independent with mobility without A.D.  Pt will benefit from skilled PT to increase their independence and safety with mobility to allow discharge to the venue listed below.  Pt would benefit from ST SNF stay at d/c for further therapy prior to d/c home. If pt does not qualify, pt would need HHPT and 24-hour assist at home. Family is unable to provide assist and would be interested in PCA.     Follow Up Recommendations Supervision/Assistance - 24 hour;SNF    Equipment Recommendations  Rolling walker with 5" wheels;Wheelchair (measurements PT);Wheelchair cushion (measurements PT) (w/c for longer distances)    Recommendations for Other Services       Precautions / Restrictions Precautions Precautions: Fall      Mobility  Bed Mobility Overal bed mobility: Needs Assistance Bed Mobility: Supine to Sit     Supine to sit: Supervision     General bed mobility comments: +rail  Transfers Overall transfer level: Needs assistance Equipment used: Rolling walker (2 wheeled) Transfers: Sit to/from [facility] Sit to Stand: Min assist Stand pivot transfers: Min assist       General transfer comment: retropulsive with initial stance  Ambulation/Gait Ambulation/Gait assistance: Min guard Ambulation  Distance (Feet): 50 Feet Assistive device: Rolling walker (2 wheeled) Gait Pattern/deviations: Step-through pattern;Decreased stride length Gait velocity: decreased Gait velocity interpretation: Below normal speed for age/gender    Stairs            Wheelchair Mobility    Modified Rankin (Stroke Patients Only)       Balance Overall balance assessment: Needs assistance Sitting-balance support: No upper extremity supported;Feet supported Sitting balance-Leahy Scale: Good     Standing balance support: Bilateral upper extremity supported;During functional activity Standing balance-Leahy Scale: Fair Standing balance comment: retropulsive with initial stance                             Pertinent Vitals/Pain Pain Assessment: No/denies pain    Home Living Family/patient expects to be discharged to:: Private residence Living Arrangements: Alone Available Help at Discharge: Family;Available PRN/intermittently Type of Home: House Home Access: Stairs to enter Entrance Stairs-Rails: Right;Left Entrance Stairs-Number of Steps: 5 Home Layout: Laundry or work area in basement;One level Home Equipment: None      Prior Function Level of Independence: Independent               Hand Dominance   Dominant Hand: Left    Extremity/Trunk Assessment   Upper Extremity Assessment: LUE deficits/detail       LUE Deficits / Details: limited shoulder ROM due to recent muscle tear   Lower Extremity Assessment: Generalized weakness      Cervical / Trunk Assessment: Kyphotic  Communication   Communication: No difficulties  Cognition Arousal/Alertness: Awake/alert Behavior During Therapy: WFL for tasks  assessed/performed Overall Cognitive Status: Within Functional Limits for tasks assessed                      General Comments      Exercises        Assessment/Plan    PT Assessment Patient needs continued PT services  PT Diagnosis Difficulty  walking;Generalized weakness   PT Problem List Decreased strength;Decreased activity tolerance;Decreased balance;Decreased mobility;Decreased knowledge of precautions;Decreased safety awareness  PT Treatment Interventions DME instruction;Gait training;Stair training;Functional mobility training;Therapeutic activities;Therapeutic exercise;Patient/family education;Balance training   PT Goals (Current goals can be found in the Care Plan section) Acute Rehab PT Goals Patient Stated Goal: independence PT Goal Formulation: With patient Time For Goal Achievement: 08/02/15 Potential to Achieve Goals: Good    Frequency Min 3X/week   Barriers to discharge Decreased caregiver support      Co-evaluation               End of Session Equipment Utilized During Treatment: Gait belt Activity Tolerance: Patient tolerated treatment well Patient left: in chair;with family/visitor present;with call bell/phone within reach Nurse Communication: Mobility status    Functional Assessment Tool Used: clinical judgement Functional Limitation: Mobility: Walking and moving around Mobility: Walking and Moving Around Current Status [phone]): At least 20 percent but less than 40 percent impaired, limited or restricted Mobility: Walking and Moving Around Goal Status [id number]): At least 1 percent but less than 20 percent impaired, limited or restricted    Time: [id number] PT Time Calculation (min) (ACUTE ONLY): 44 min   Charges:   PT Evaluation $PT Eval Moderate Complexity: 1 Procedure PT Treatments $Gait Training: 8-22 mins $Therapeutic Activity: 8-22 mins   PT G Codes:   PT G-Codes **NOT FOR INPATIENT CLASS** Functional Assessment Tool Used: clinical judgement Functional Limitation: Mobility: Walking and moving around Mobility: Walking and Moving Around Current Status [id number]): At least 20 percent but less than 40 percent impaired, limited or restricted Mobility: Walking and Moving Around Goal Status  [phone]): At least 1 percent but less than 20 percent impaired, limited or restricted    Lorriane Shire 07/19/2015, 12:44 PM

## 2015-07-19 NOTE — Progress Notes (Signed)
ANTICOAGULATION CONSULT NOTE - Follow Up Consult  Pharmacy Consult for Warfarin Indication: atrial fibrillation  Allergies  Allergen Reactions  . Penicillins Swelling    Has patient had a PCN reaction causing immediate rash, facial/tongue/throat swelling, SOB or lightheadedness with hypotension: NO Has patient had a PCN reaction causing severe rash involving mucus membranes or skin necrosis: NO Has patient had a PCN reaction that required hospitalization NO Has patient had a PCN reaction occurring within the last 10 years: NO If all of the above answers are "NO", then may proceed with Cephalosporin use.    Patient Measurements: Weight: 163 lb (73.936 kg)  Vital Signs: Temp: 98.2 F (36.8 C) (07/01 1349) Temp Source: Oral (07/01 1349) BP: 120/62 mmHg (07/01 1349) Pulse Rate: 57 (07/01 1349)  Labs:  Recent Labs  07/17/15 2200 07/17/15 2344 07/18/15 0548 07/19/15 0714  HGB 13.0  --   --  12.1*  HCT 41.1  --   --  38.1*  PLT 188  --   --  173  LABPROT  --  25.7* 25.5* 25.3*  INR  --  2.38* 2.36* 2.33*  CREATININE 1.54*  --   --  1.66*    CrCl cannot be calculated (Unknown ideal weight.).   Assessment: 37 YOM who continues on warfarin from PTA for hx Afib.  INR today is therapeutic (INR 2.33 << 2.36, goal of 2-3). Hgb/Hct slight drop - no overt s/sx of bleeding noted. Will continue with PTA dosing.  Goal of Therapy:  INR 2-3   Plan:  1. Continue Warfarin 2.5 mg/day EXCEPT for 5 mg on T/Th/Sun - dose this evening is 2.5 mg x 1 2. Will continue to monitor for any signs/symptoms of bleeding and will follow up with PT/INR in the a.m.   Alycia Rossetti, PharmD, BCPS Clinical Pharmacist Pager: [phone] 07/19/2015 2:52 PM

## 2015-07-19 NOTE — Progress Notes (Addendum)
PROGRESS NOTE  Oscar Bruce [id number] DOB: 10-02-1924 DOA: 07/17/2015 PCP: Horatio Pel, MD     Brief Narrative: 82 yo M with DM, CHFpEF, chronic venous insufficiency admitted with LE ulcers and erythema c/w cellulitis.   Assessment & Plan: Principal Problem:   Cellulitis of right lower leg Active Problems:   Venous stasis ulcer of right lower extremity (HCC)   Neoplasm of skin of ear   RLE cellulitis / wounds - wound RN consulted, appreciate input - Unna boot - continue IV antibiotics for today and may be able to transition to po tomorrow  Probable skin cancer - no derm available, outpatient management. Its been present for several months  DM  - continue home meds, insulin - CBG 60s this morning, decrease Levemir, add CBGs 4 times daily  HTN - continue Metoprolol, clonidine  A fib / PPM - continue Coumadin, Metoprolol, DIgoxin  CKD III - GFR in the 50s in 2011 - Cr slightly worse today, hold Lasix and Ramipril   DVT prophylaxis: Coumadin Code Status: Full Family Communication: d/w daughter bedside Disposition Plan: home when ready   Consultants:   None   Procedures:   none  Antimicrobials:  Ceftriaxone 6/30 >>   Subjective: - patient feels great, no complaints  Objective: Filed Vitals:   07/18/15 2113 07/19/15 0532 07/19/15 0900 07/19/15 0904  BP: 161/66 162/57    Pulse: 61 65  68  Temp: 98.9 F (37.2 C) 99.1 F (37.3 C)    TempSrc: Oral Oral    Resp: 18 16    Weight:   73.936 kg (163 lb)   SpO2: 96% 99%      Intake/Output Summary (Last 24 hours) at 07/19/15 1327 Last data filed at 07/19/15 0911  Gross per 24 hour  Intake   1500 ml  Output    826 ml  Net    674 ml   Filed Weights   07/19/15 0900  Weight: 73.936 kg (163 lb)   Examination: Constitutional: NAD Filed Vitals:   07/18/15 2113 07/19/15 0532 07/19/15 0900 07/19/15 0904  BP: 161/66 162/57    Pulse: 61 65  68  Temp: 98.9 F (37.2 C) 99.1 F (37.3 C)     TempSrc: Oral Oral    Resp: 18 16    Weight:   73.936 kg (163 lb)   SpO2: 96% 99%     Neck: normal, supple, no masses, no thyromegaly Respiratory: clear to auscultation bilaterally, no wheezing, no crackles. Normal respiratory effort. No accessory muscle use.  Cardiovascular: Regular rate and rhythm, no murmurs / rubs / gallops.  Abdomen: no tenderness. Bowel sounds positive.  Musculoskeletal: no clubbing / cyanosis. Unna boot bilateral legs Skin: no rashes, lesions, ulcers. No induration Neurologic: non focal    Data Reviewed: I have personally reviewed following labs and imaging studies  CBC:  Recent Labs Lab 07/17/15 2200 07/19/15 0714  WBC 9.7 11.0*  NEUTROABS 6.3  --   HGB 13.0 12.1*  HCT 41.1 38.1*  MCV 91.5 91.4  PLT 188 [id number]   Basic Metabolic Panel:  Recent Labs Lab 07/17/15 2200 07/19/15 0714  NA 136 137  K 3.3* 3.6  CL 101 100*  CO2 26 28  GLUCOSE 191* 63*  BUN 29* 35*  CREATININE 1.54* 1.66*  CALCIUM 8.4* 8.3*   GFR: CrCl cannot be calculated (Unknown ideal weight.). Liver Function Tests:  Recent Labs Lab 07/17/15 2200  AST 38  ALT 21  ALKPHOS 148*  BILITOT 0.8  PROT 7.4  ALBUMIN 3.1*   Coagulation Profile:  Recent Labs Lab 07/17/15 2344 07/18/15 0548 07/19/15 0714  INR 2.38* 2.36* 2.33*   CBG:  Recent Labs Lab 07/18/15 0051  GLUCAP 167*   Radiology Studies: Dg Chest Port 1 View  07/17/2015  CLINICAL DATA:  Cough and congestion for 1 week. EXAM: PORTABLE CHEST 1 VIEW COMPARISON:  02/16/2011 FINDINGS: Intact appearances of the transvenous cardiac leads. The lungs are clear. The pulmonary vasculature is normal. There is no large effusion. Hilar and mediastinal contours are unremarkable and unchanged. IMPRESSION: No acute cardiopulmonary findings. Electronically Signed   By: Andreas Newport M.D.   On: 07/17/2015 22:55   Scheduled Meds: . atorvastatin  20 mg Oral q1800  . cefTRIAXone (ROCEPHIN)  IV  1 g Intravenous QHS  .  cloNIDine  0.4 mg Oral TID  . digoxin  0.125 mg Oral Daily  . exenatide  5,000 mcg Subcutaneous QHS  . insulin detemir  20 Units Subcutaneous QHS  . metoprolol  75 mg Oral Daily  . ramipril  5 mg Oral Daily  . warfarin  2.5 mg Oral Once per day on Mon Wed Fri Sat  . warfarin  5 mg Oral Once per day on Sun Tue Thu  . Warfarin - Pharmacist Dosing Inpatient   Does not apply q1800   Continuous Infusions:    Marzetta Board, MD, PhD [facility] Pager [phone]  If 7PM-7AM, please contact night-coverage [email or web address] Password [email or web address] 07/19/2015, 1:27 PM

## 2015-07-19 NOTE — Clinical Social Work Placement (Signed)
   CLINICAL SOCIAL WORK PLACEMENT  NOTE  Date:  07/19/2015  Patient Details  Name: Oscar Bruce MRN: [id number] Date of Birth: [birth year]  Clinical Social Work is seeking post-discharge placement for this patient at the [place] level of care (*CSW will initial, date and re-position this form in  chart as items are completed):  Yes   Patient/family provided with [place] Work Department's list of facilities offering this level of care within the geographic area requested by the patient (or if unable, by the patient's family).  Yes   Patient/family informed of their freedom to choose among providers that offer the needed level of care, that participate in Medicare, Medicaid or managed care program needed by the patient, have an available bed and are willing to accept the patient.  Yes   Patient/family informed of [facility]'s ownership interest in [facility] and [facility], as well as of the fact that they are under no obligation to receive care at these facilities.  PASRR submitted to EDS on 07/19/15     PASRR number received on 07/19/15     Existing PASRR number confirmed on       FL2 transmitted to all facilities in geographic area requested by pt/family on 07/19/15     FL2 transmitted to all facilities within larger geographic area on       Patient informed that his/her managed care company has contracts with or will negotiate with certain facilities, including the following:            Patient/family informed of bed offers received.  Patient chooses bed at       Physician recommends and patient chooses bed at      Patient to be transferred to   on  .  Patient to be transferred to facility by       Patient family notified on   of transfer.  Name of family member notified:        PHYSICIAN Please prepare priority discharge summary, including medications, Please prepare prescriptions, Please sign FL2     Additional  Comment:    _______________________________________________ Junie Spencer, LCSW 07/19/2015, 4:32 PM

## 2015-07-19 NOTE — Clinical Social Work Note (Signed)
Clinical Social Work Assessment  Patient Details  Name: Oscar Bruce MRN: [id number] Date of Birth: 25-Jun-1924  Date of referral:  07/19/15               Reason for consult:  Discharge Planning                Permission sought to share information with:  Case Manager, Facility Sport and exercise psychologist, Family Supports Permission granted to share information::  Yes, Verbal Permission Granted  Name::        Agency::   (SNF)  Relationship::     Contact Information:     Housing/Transportation Living arrangements for the past 2 months:  Single Family Home Source of Information:  Patient, Medical Team, Adult Children Patient Interpreter Needed:  None Criminal Activity/Legal Involvement Pertinent to Current Situation/Hospitalization:  No - Comment as needed Significant Relationships:  Adult Children Lives with:  Self Do you feel safe going back to the place where you live?  No Need for family participation in patient care:  Yes (Comment)  Care giving concerns: Pt and daughter expressed concerns regarding need for SNF and insurance coverage. Pt and daughter agreeable to SNF.    Social Worker assessment / plan: Holiday representative met with pt and daughter to discuss CSW role with discharge planning. CSW explained PT recommendation for SNF. Pt agreeable to SNF for higher level of care. Pt and daughter stated that they will consult with cousin to aid in choosing SNF for short term rehab.   CSW will follow up with bed offers once available.   Employment status:  Retired Forensic scientist:   Research officer, political party) PT Recommendations:  [place] / Referral to community resources:  [place]  Patient/Family's Response to care:Pt pleasant and lying in bed. Pt understands SNF recommendation and pt appears happy with care he is receiving at [facility].   Patient/Family's Understanding of and Emotional Response to Diagnosis, Current Treatment, and  Prognosis: Pt and dtr appear to have a good understanding for pt admission into hospital and pt care plan.   Emotional Assessment Appearance:  Appears stated age, Well-Groomed Attitude/Demeanor/Rapport:   (Pleasant ) Affect (typically observed):  Accepting, Appropriate, Pleasant, Hopeful Orientation:  Oriented to Self, Oriented to Place, Oriented to  Time, Oriented to Situation Alcohol / Substance use:  Not Applicable Psych involvement (Current and /or in the community):  No (Comment)  Discharge Needs  Concerns to be addressed:  Discharge Planning Concerns Readmission within the last 30 days:  No Current discharge risk:  Lives alone Barriers to Discharge:  Continued Medical Work up   [employer], LCSW 07/19/2015, 4:14 PM

## 2015-07-19 NOTE — NC FL2 (Signed)
[place] LEVEL OF CARE SCREENING TOOL     IDENTIFICATION  Patient Name: Oscar Bruce Birthdate: 07/17/1924 Sex: male Admission Date (Current Location): 07/17/2015  [facility] and [place] Number:  Herbalist and Address:  [facility], [place] [address] [place], [place] [address]      Provider Number: [id number]  Attending Physician Name and Address:  Caren Griffins, MD  Relative Name and Phone Number:       Current Level of Care: Hospital Recommended Level of Care: [place] Prior Approval Number:    Date Approved/Denied:   PASRR Number:  ([id number])  Discharge Plan: SNF    Current Diagnoses: Patient Active Problem List   Diagnosis Date Noted  . 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 Cellulitis of right lower leg 07/17/2015  . Venous stasis ulcer of right lower extremity ([place]) 07/17/2015  . Encounter for therapeutic drug monitoring 02/13/2013  . Congestive heart failure with left ventricular diastolic dysfunction ([place]) 04/13/2012  . Long term (current) use of anticoagulants 05/05/2010  . PPM-[place] 07/28/2009  . Essential hypertension 07/25/2009  . CAD 07/25/2009  . ATRIAL FIBRILLATION 07/25/2009    Orientation RESPIRATION BLADDER Height & Weight     Self, Time, Situation, Place  Normal Continent Weight: 163 lb (73.936 kg) Height:     BEHAVIORAL SYMPTOMS/MOOD NEUROLOGICAL BOWEL NUTRITION STATUS   (none)  (none) Continent Diet (Carb modified)  AMBULATORY STATUS COMMUNICATION OF NEEDS Skin   Extensive Assist Verbally Surgical wounds                       Personal Care Assistance Level of Assistance  Bathing, Feeding, Dressing Bathing Assistance: Limited assistance Feeding assistance: Independent Dressing Assistance: Limited assistance     Functional Limitations Info  Sight, Hearing, Speech Sight Info: Adequate Hearing Info: Adequate Speech Info: Adequate    SPECIAL CARE FACTORS  FREQUENCY  PT (By licensed PT)     PT Frequency:  (5x/week)              Contractures Contractures Info: Not present    Additional Factors Info  Code Status, Allergies Code Status Info:  (full) Allergies Info:  (Penicillins)           Current Medications (07/19/2015):  This is the current hospital active medication list Current Facility-Administered Medications  Medication Dose Route Frequency Provider Last Rate Last Dose  . acetaminophen (TYLENOL) tablet 500 mg  500 mg Oral Q6H PRN Etta Quill, DO   500 mg at 07/19/15 0545  . atorvastatin (LIPITOR) tablet 20 mg  20 mg Oral q1800 Etta Quill, DO   20 mg at 07/18/15 1804  . cefTRIAXone (ROCEPHIN) 1 g in dextrose 5 % 50 mL IVPB  1 g Intravenous QHS Etta Quill, DO   1 g at 07/18/15 2109  . cloNIDine (CATAPRES) tablet 0.4 mg  0.4 mg Oral TID Etta Quill, DO   0.4 mg at 07/19/15 [id number]  . digoxin (LANOXIN) tablet 0.125 mg  0.125 mg Oral Daily Etta Quill, DO   0.125 mg at 07/19/15 0905  . exenatide (BYETTA) injection 5,000 mcg  5,000 mcg Subcutaneous QHS Etta Quill, DO   5,000 mcg at 07/18/15 2300  . insulin aspart (novoLOG) injection 0-9 Units  0-9 Units Subcutaneous TID WC Costin Karlyne Greenspan, MD      . insulin detemir (LEVEMIR) injection 18 Units  18 Units Subcutaneous QHS Costin  Karlyne Greenspan, MD      . Derrill Memo ON 07/20/2015] metoprolol succinate (TOPROL-XL) 24 hr tablet 75 mg  75 mg Oral Daily Costin Karlyne Greenspan, MD      . mineral oil-hydrophilic petrolatum (AQUAPHOR) ointment   Topical Daily PRN Caren Griffins, MD      . warfarin (COUMADIN) tablet 2.5 mg  2.5 mg Oral Once per day on Mon Wed Fri Sat Laren Everts, RPH   2.5 mg at 07/18/15 2111  . warfarin (COUMADIN) tablet 5 mg  5 mg Oral Once per day on Sun Tue Thu Laren Everts, RPH   5 mg at 07/18/15 [id number]  . Warfarin - Pharmacist Dosing Inpatient   Does not apply [place]         Discharge Medications: Please see discharge summary for a list  of discharge medications.  Relevant Imaging Results:  Relevant Lab Results:   Additional Information SS#: [id number]  Junie Spencer, LCSW

## 2015-07-20 ENCOUNTER — Other Ambulatory Visit: Payer: Self-pay | Admitting: Internal Medicine

## 2015-07-20 ENCOUNTER — Observation Stay (HOSPITAL_COMMUNITY): Payer: Medicare Other

## 2015-07-20 DIAGNOSIS — L03115 Cellulitis of right lower limb: Secondary | ICD-10-CM | POA: Diagnosis not present

## 2015-07-20 LAB — BASIC METABOLIC PANEL
ANION GAP: 9 (ref 5–15)
BUN: 44 mg/dL — ABNORMAL HIGH (ref 6–20)
CALCIUM: 8.4 mg/dL — AB (ref 8.9–10.3)
CO2: 26 mmol/L (ref 22–32)
Chloride: 101 mmol/L (ref 101–111)
Creatinine, Ser: 1.71 mg/dL — ABNORMAL HIGH (ref 0.61–1.24)
GFR, EST AFRICAN AMERICAN: 39 mL/min — AB (ref >60)
GFR, EST NON AFRICAN AMERICAN: 34 mL/min — AB (ref >60)
GLUCOSE: 55 mg/dL — AB (ref 65–99)
POTASSIUM: 3.6 mmol/L (ref 3.5–5.1)
Sodium: 136 mmol/L (ref 135–145)

## 2015-07-20 LAB — CBC
HEMATOCRIT: 40.2 % (ref 39.0–52.0)
HEMOGLOBIN: 13 g/dL (ref 13.0–17.0)
MCH: 29.6 pg (ref 26.0–34.0)
MCHC: 32.3 g/dL (ref 30.0–36.0)
MCV: 91.6 fL (ref 78.0–100.0)
Platelets: 186 10*3/uL (ref 150–400)
RBC: 4.39 MIL/uL (ref 4.22–5.81)
RDW: 14.7 % (ref 11.5–15.5)
WBC: 13.5 10*3/uL — AB (ref 4.0–10.5)

## 2015-07-20 LAB — GLUCOSE, CAPILLARY
GLUCOSE-CAPILLARY: 47 mg/dL — AB (ref 65–99)
GLUCOSE-CAPILLARY: 122 mg/dL — AB (ref 65–99)
GLUCOSE-CAPILLARY: 158 mg/dL — AB (ref 65–99)
Glucose-Capillary: 89 mg/dL (ref 65–99)
Glucose-Capillary: 165 mg/dL — ABNORMAL HIGH (ref 65–99)

## 2015-07-20 LAB — PROTIME-INR
INR: 2.2 — ABNORMAL HIGH (ref 0.00–1.49)
Prothrombin Time: 24.3 seconds — ABNORMAL HIGH (ref 11.6–15.2)

## 2015-07-20 MED ORDER — SODIUM CHLORIDE 0.9 % IV BOLUS (SEPSIS)
250.0000 mL | Freq: Once | INTRAVENOUS | Status: AC
  Administered 2015-07-20: 250 mL via INTRAVENOUS

## 2015-07-20 MED ORDER — IPRATROPIUM-ALBUTEROL 0.5-2.5 (3) MG/3ML IN SOLN: 3.0000 mL | Freq: Four times a day (QID) | RESPIRATORY_TRACT | Status: DC | PRN

## 2015-07-20 MED ORDER — IPRATROPIUM-ALBUTEROL 0.5-2.5 (3) MG/3ML IN SOLN
3.0000 mL | Freq: Four times a day (QID) | RESPIRATORY_TRACT | Status: DC
  Administered 2015-07-20: 3 mL via RESPIRATORY_TRACT

## 2015-07-20 MED ORDER — INSULIN DETEMIR 100 UNIT/ML ~~LOC~~ SOLN
12.0000 [IU] | Freq: Every day | SUBCUTANEOUS | Status: DC
Start: 2015-07-20 — End: 2015-07-21
  Administered 2015-07-20: 12 [IU] via SUBCUTANEOUS
  Filled 2015-07-20 (×2): qty 0.12

## 2015-07-20 NOTE — Progress Notes (Signed)
Occupational Therapy Evaluation Patient Details Name: SHERMAR WISMAN MRN: [id number] DOB: 1924/05/28 Today's Date: 07/20/2015    History of Present Illness 82 y.o. male with medical history significant of DM, diastolic CHF, and venous stasis ulcers of legs.He presented to the ED with wound R distal LE. Pt admitted with dx of RLE cellulitis.    Clinical Impression   PTA, pt lived alone and was independent with ADL and mobility, Pt currently requires min guard assist with mobility and ADL @ RW level and would benefit from rehab at SNF to return to PLOF. Pt with new onset of L shoulder weakness consistent with apparent rotator cuff insufficiency. Recommend SNF for rehab and follow up with orthopedic MD to further assess L shoulder. Will follow acutely to address established goals.     Follow Up Recommendations  SNF;Supervision/Assistance - 24 hour    Equipment Recommendations  3 in 1 bedside comode;Tub/shower bench    Recommendations for Other Services       Precautions / Restrictions Precautions Precautions: Fall Restrictions Weight Bearing Restrictions: No      Mobility Bed Mobility Overal bed mobility: Needs Assistance Bed Mobility: Sit to Supine     Supine to sit: Supervision        Transfers Overall transfer level: Needs assistance Equipment used: Rolling walker (2 wheeled) Transfers: Sit to/from [facility] Sit to Stand: Min guard Stand pivot transfers: Min guard       General transfer comment: no posterior lean today but unsteady    Balance Overall balance assessment: Needs assistance   Sitting balance-Leahy Scale: Good       Standing balance-Leahy Scale: Fair                              ADL Overall ADL's : Needs assistance/impaired     Grooming: Standing;Set up   Upper Body Bathing: Minimal assitance;Sitting   Lower Body Bathing: Min guard;Sit to/from stand   Upper Body Dressing : Minimal assistance   Lower  Body Dressing: Min guard;Sit to/from stand   Toilet Transfer: Min guard;Ambulation   Toileting- Clothing Manipulation and Hygiene: Supervision/safety;Sit to/from stand       Functional mobility during ADLs: Min guard;Rolling walker General ADL Comments: vc for safety when sitting adn not "flopping into bed"     Vision     Perception     Praxis      Pertinent Vitals/Pain Pain Assessment: No/denies pain     Hand Dominance Left   Extremity/Trunk Assessment Upper Extremity Assessment Upper Extremity Assessment: LUE deficits/detail LUE Deficits / Details: limited shoulder AROM.blet o achiee @ 0-20 FF. ) ER. abduciton WFL. elbow/wrist/hand WFL LUE Coordination: decreased gross motor   Lower Extremity Assessment Lower Extremity Assessment: Generalized weakness   Cervical / Trunk Assessment Cervical / Trunk Assessment: Kyphotic   Communication Communication Communication: No difficulties   Cognition Arousal/Alertness: Awake/alert Behavior During Therapy: WFL for tasks assessed/performed Overall Cognitive Status: Within Functional Limits for tasks assessed                     General Comments       Exercises Exercises: General Upper Extremity;Other exercises Other Exercises Other Exercises: ER AAROM 10 reps/supine Other Exercises: table slides into FF and ER Other Exercises: lap slides   Shoulder Instructions      Home Living Family/patient expects to be discharged to:: Private residence Living Arrangements: Alone Available Help at Discharge: Family;Available PRN/intermittently  Type of Home: House Home Access: Stairs to enter [employer] of Steps: 5 Entrance Stairs-Rails: Right;Left Home Layout: Laundry or work area in basement;One level     Bathroom Shower/Tub: Advertising copywriter: Yes How Accessible: Accessible via walker Home Equipment: None          Prior Functioning/Environment  Level of Independence: Independent             OT Diagnosis: Generalized weakness   OT Problem List: Decreased strength;Decreased range of motion;Decreased activity tolerance;Impaired balance (sitting and/or standing);Decreased safety awareness;Decreased knowledge of use of DME or AE;Impaired UE functional use   OT Treatment/Interventions: Self-care/ADL training;Therapeutic exercise;DME and/or AE instruction;Therapeutic activities;Patient/family education;Balance training    OT Goals(Current goals can be found in the care plan section) Acute Rehab OT Goals Patient Stated Goal: independence OT Goal Formulation: With patient Time For Goal Achievement: 08/03/15 Potential to Achieve Goals: Good  OT Frequency: Min 2X/week   Barriers to D/C:            Co-evaluation              End of Session Equipment Utilized During Treatment: Gait belt;Rolling walker Nurse Communication: Mobility status  Activity Tolerance: Patient tolerated treatment well Patient left: in bed;with call bell/phone within reach;with bed alarm set   Time: 1050-1105 OT Time Calculation (min): 15 min Charges:  OT General Charges $OT Visit: 1 Procedure OT Evaluation $OT Eval Moderate Complexity: 1 Procedure G-Codes: OT G-codes **NOT FOR INPATIENT CLASS** Functional Assessment Tool Used: clinical judgement Functional Limitation: Self care Self Care Current Status [id number]): At least 1 percent but less than 20 percent impaired, limited or restricted Self Care Goal Status [id number]): At least 1 percent but less than 20 percent impaired, limited or restricted  Lekha Dancer,HILLARY 07/20/2015, 11:30 AM   [place], OTR/L  [phone] 07/20/2015

## 2015-07-20 NOTE — Progress Notes (Addendum)
PROGRESS NOTE  Oscar Bruce [id number] DOB: 1924-02-16 DOA: 07/17/2015 PCP: Horatio Pel, MD     Brief Narrative: 82 yo M with DM, CHFpEF, chronic venous insufficiency admitted with LE ulcers and erythema c/w cellulitis.   Assessment & Plan: Principal Problem:   Cellulitis of right lower leg Active Problems:   Venous stasis ulcer of right lower extremity (HCC)   Neoplasm of skin of ear   RLE cellulitis / wounds - wound RN consulted, appreciate input - Unna boot - continue IV antibiotics for today and may be able to transition to po tomorrow  Probable skin cancer - no derm available, outpatient management. Its been present for several months  Cough - chronic, but feels like he is coughing more today - obtain CXR - IS, flutter valve  Deconditioning - PT recommending SNF, patient agreeable. SW consult.  DM  - continue home meds, insulin - CBG again low this morning, decrease Levemir further, add CBGs 4 times daily  HTN - continue Metoprolol, clonidine  A fib / PPM - continue Coumadin, Metoprolol, DIgoxin  CKD III - GFR in the 50s in 2011 - Cr continues to climb today, hold Lasix and Ramipril - give back 250 cc bolus   DVT prophylaxis: Coumadin Code Status: Full Family Communication: d/w daughter bedside Disposition Plan: SNF 1 day  Consultants:   None   Procedures:   none  Antimicrobials:  Ceftriaxone 6/30 >>   Subjective: - complains of a cough, chronic. Feels like it is more productive today  Objective: Filed Vitals:   07/19/15 1349 07/19/15 2208 07/20/15 0500 07/20/15 0810  BP: 120/62 105/56 124/61   Pulse: 57 59 67 62  Temp: 98.2 F (36.8 C) 97.8 F (36.6 C) 98.4 F (36.9 C)   TempSrc: Oral Oral    Resp: 15 15 17 16   Height:      Weight:      SpO2: 100% 97% 100% 100%    Intake/Output Summary (Last 24 hours) at 07/20/15 1102 Last data filed at 07/20/15 0500  Gross per 24 hour  Intake    392 ml  Output    825 ml   Net   -433 ml   Filed Weights   07/19/15 0900  Weight: 73.936 kg (163 lb)   Examination: Constitutional: NAD Filed Vitals:   07/19/15 1349 07/19/15 2208 07/20/15 0500 07/20/15 0810  BP: 120/62 105/56 124/61   Pulse: 57 59 67 62  Temp: 98.2 F (36.8 C) 97.8 F (36.6 C) 98.4 F (36.9 C)   TempSrc: Oral Oral    Resp: 15 15 17 16   Height:      Weight:      SpO2: 100% 97% 100% 100%   Neck: normal, supple, no masses, no thyromegaly Respiratory: clear to auscultation bilaterally, no wheezing, no crackles. Normal respiratory effort. No accessory muscle use.  Cardiovascular: Regular rate and rhythm, no murmurs / rubs / gallops.  Abdomen: no tenderness. Bowel sounds positive.  Musculoskeletal: no clubbing / cyanosis. Unna boot bilateral legs Skin: no rashes, lesions, ulcers. No induration Neurologic: non focal    Data Reviewed: I have personally reviewed following labs and imaging studies  CBC:  Recent Labs Lab 07/17/15 2200 07/19/15 0714 07/20/15 0526  WBC 9.7 11.0* 13.5*  NEUTROABS 6.3  --   --   HGB 13.0 12.1* 13.0  HCT 41.1 38.1* 40.2  MCV 91.5 91.4 91.6  PLT 188 173 [id number]   Basic Metabolic Panel:  Recent Labs Lab 07/17/15 2200 07/19/15 0714  07/20/15 0526  NA 136 137 136  K 3.3* 3.6 3.6  CL 101 100* 101  CO2 26 28 26   GLUCOSE 191* 63* 55*  BUN 29* 35* 44*  CREATININE 1.54* 1.66* 1.71*  CALCIUM 8.4* 8.3* 8.4*   GFR: Estimated Creatinine Clearance: 28.3 mL/min (by C-G formula based on Cr of 1.71). Liver Function Tests:  Recent Labs Lab 07/17/15 2200  AST 38  ALT 21  ALKPHOS 148*  BILITOT 0.8  PROT 7.4  ALBUMIN 3.1*   Coagulation Profile:  Recent Labs Lab 07/17/15 2344 07/18/15 0548 07/19/15 0714 07/20/15 0526  INR 2.38* 2.36* 2.33* 2.20*   CBG:  Recent Labs Lab 07/18/15 0051 07/19/15 1659 07/19/15 2204 07/20/15 0820 07/20/15 0850  GLUCAP 167* 176* 161* 47* 89   Radiology Studies: Dg Chest 2 View  07/20/2015  CLINICAL DATA:   Wheezing. Pt is currently in the hospital for an DM related right foot infection. Hx of DM, HTN, cancer, CHF, CAD, A-fib EXAM: CHEST - 2 VIEW COMPARISON:  07/17/2015 FINDINGS: Stable linear scarring or subsegmental atelectasis at the left lung base. Right lung clear. Heart size upper limits normal. Atheromatous aorta. Previous CABG. Stable right subclavian transvenous pacemaker. No effusion. IMPRESSION: 1. Stable chronic and postoperative changes.  No acute disease. Electronically Signed   By: Lucrezia Europe M.D.   On: 07/20/2015 08:44   Scheduled Meds: . atorvastatin  20 mg Oral q1800  . cefTRIAXone (ROCEPHIN)  IV  1 g Intravenous QHS  . cloNIDine  0.4 mg Oral TID  . digoxin  0.125 mg Oral Daily  . exenatide  5,000 mcg Subcutaneous QHS  . insulin aspart  0-9 Units Subcutaneous TID WC  . insulin detemir  18 Units Subcutaneous QHS  . metoprolol succinate  75 mg Oral Daily  . sodium chloride  250 mL Intravenous Once  . warfarin  2.5 mg Oral Once per day on Mon Wed Fri Sat  . warfarin  5 mg Oral Once per day on Sun Tue Thu  . Warfarin - Pharmacist Dosing Inpatient   Does not apply q1800   Continuous Infusions:    Marzetta Board, MD, PhD [facility] Pager [phone]  If 7PM-7AM, please contact night-coverage [email or web address] Password [email or web address] 07/20/2015, 11:02 AM

## 2015-07-20 NOTE — Discharge Instructions (Signed)

## 2015-07-20 NOTE — Progress Notes (Addendum)
Patient is back from radiology and just had cbg checked=47. He is asymptomatic, a/o x4, and wants to eat breakfast and have it rechecked. MD notified .  cbg rechecked at 0850= 89.

## 2015-07-20 NOTE — Progress Notes (Signed)
ANTICOAGULATION CONSULT NOTE - Follow Up Consult  Pharmacy Consult for Warfarin Indication: atrial fibrillation  Allergies  Allergen Reactions  . Penicillins Swelling    Has patient had a PCN reaction causing immediate rash, facial/tongue/throat swelling, SOB or lightheadedness with hypotension: NO Has patient had a PCN reaction causing severe rash involving mucus membranes or skin necrosis: NO Has patient had a PCN reaction that required hospitalization NO Has patient had a PCN reaction occurring within the last 10 years: NO If all of the above answers are "NO", then may proceed with Cephalosporin use.    Patient Measurements: Height: 5\' 8"  (172.7 cm) Weight: 163 lb (73.936 kg) IBW/kg (Calculated) : 68.4  Vital Signs: Temp: 98.4 F (36.9 C) (07/02 0500) BP: 124/61 mmHg (07/02 0500) Pulse Rate: 62 (07/02 0810)  Labs:  Recent Labs  07/17/15 2200  07/18/15 0548 07/19/15 0714 07/20/15 0526  HGB 13.0  --   --  12.1* 13.0  HCT 41.1  --   --  38.1* 40.2  PLT 188  --   --  173 186  LABPROT  --   < > 25.5* 25.3* 24.3*  INR  --   < > 2.36* 2.33* 2.20*  CREATININE 1.54*  --   --  1.66* 1.71*  < > = values in this interval not displayed.  Estimated Creatinine Clearance: 28.3 mL/min (by C-G formula based on Cr of 1.71).   Assessment: 33 YOM who continues on warfarin from PTA for hx Afib.  INR today is therapeutic (INR 2.2 << 2.33, goal of 2-3). CBC stable - no overt s/sx of bleeding noted. Will continue with PTA dosing.  Goal of Therapy:  INR 2-3   Plan:  1. Continue Warfarin 2.5 mg/day EXCEPT for 5 mg on T/Th/Sun - dose this evening is 5 mg x 1 2. Will continue to monitor for any signs/symptoms of bleeding and will follow up with PT/INR in the a.m.   Alycia Rossetti, PharmD, BCPS Clinical Pharmacist Pager: [phone] 07/20/2015 1:29 PM

## 2015-07-21 ENCOUNTER — Encounter (HOSPITAL_COMMUNITY): Payer: Self-pay

## 2015-07-21 ENCOUNTER — Telehealth: Payer: Self-pay | Admitting: Pharmacist

## 2015-07-21 ENCOUNTER — Encounter (HOSPITAL_COMMUNITY): Payer: Medicare Other

## 2015-07-21 DIAGNOSIS — I509 Heart failure, unspecified: Secondary | ICD-10-CM | POA: Insufficient documentation

## 2015-07-21 DIAGNOSIS — Z951 Presence of aortocoronary bypass graft: Secondary | ICD-10-CM | POA: Insufficient documentation

## 2015-07-21 DIAGNOSIS — E119 Type 2 diabetes mellitus without complications: Secondary | ICD-10-CM | POA: Insufficient documentation

## 2015-07-21 DIAGNOSIS — L03115 Cellulitis of right lower limb: Secondary | ICD-10-CM | POA: Diagnosis not present

## 2015-07-21 LAB — BASIC METABOLIC PANEL
ANION GAP: 8 (ref 5–15)
BUN: 50 mg/dL — ABNORMAL HIGH (ref 6–20)
CO2: 25 mmol/L (ref 22–32)
Calcium: 8.4 mg/dL — ABNORMAL LOW (ref 8.9–10.3)
Chloride: 100 mmol/L — ABNORMAL LOW (ref 101–111)
Creatinine, Ser: 1.78 mg/dL — ABNORMAL HIGH (ref 0.61–1.24)
GFR calc Af Amer: 37 mL/min — ABNORMAL LOW (ref >60)
GFR calc non Af Amer: 32 mL/min — ABNORMAL LOW (ref >60)
GLUCOSE: 64 mg/dL — AB (ref 65–99)
POTASSIUM: 3.8 mmol/L (ref 3.5–5.1)
Sodium: 133 mmol/L — ABNORMAL LOW (ref 135–145)

## 2015-07-21 LAB — PROTIME-INR
INR: 2.17 — ABNORMAL HIGH (ref 0.00–1.49)
PROTHROMBIN TIME: 24 s — AB (ref 11.6–15.2)

## 2015-07-21 LAB — GLUCOSE, CAPILLARY
GLUCOSE-CAPILLARY: 146 mg/dL — AB (ref 65–99)
Glucose-Capillary: 46 mg/dL — ABNORMAL LOW (ref 65–99)

## 2015-07-21 MED ORDER — INSULIN DETEMIR 100 UNIT/ML ~~LOC~~ SOLN: 8.0000 [IU] | Freq: Every day | SUBCUTANEOUS | Status: AC

## 2015-07-21 MED ORDER — FUROSEMIDE 40 MG PO TABS: 40.0000 mg | ORAL_TABLET | Freq: Every day | ORAL | Status: DC | PRN

## 2015-07-21 MED ORDER — DOXYCYCLINE HYCLATE 100 MG PO CAPS
100.0000 mg | ORAL_CAPSULE | Freq: Two times a day (BID) | ORAL | Status: DC
Start: 2015-07-21 — End: 2015-08-06

## 2015-07-21 NOTE — Telephone Encounter (Signed)
Daughter called stating pt is leaving hospital now. Informed her pt does not need INR check because it was checked in the hospital this morning and pt was therapeutic. He was started on doxycycline 100mg  BID for another 5 days at discharge. Pt going to [facility] for ~2 weeks. Advised pt's daughter that he will need INR checked in about 3 days at [facility] (they manage pt's Coumadin there) and she will make sure this is done.

## 2015-07-21 NOTE — Progress Notes (Signed)
Physical Therapy Treatment Patient Details Name: Oscar Bruce MRN: [id number] DOB: February 15, 1924 Today's Date: 07/21/2015    History of Present Illness 82 y.o. male with medical history significant of DM, diastolic CHF, and venous stasis ulcers of legs.He presented to the ED with wound R distal LE. Pt admitted with dx of RLE cellulitis.     PT Comments    Pt eager for mobility, but does indicate feeling something sticking into the bottom of his foot.  Sock removed and dressing inspected with nothing obvious noted.  Pt and daughter relate that pt has an object imbedded in the sole of his foot and normally does not walk in just socks.  Pt does indicate he has had an MD check his foot and that he is supposed to wear his sneakers for ambulation.  Pt and daughter ed on making sure to have his shoes for rehab.    Follow Up Recommendations  SNF     Equipment Recommendations  None recommended by PT    Recommendations for Other Services       Precautions / Restrictions Precautions Precautions: Fall Restrictions Weight Bearing Restrictions: No    Mobility  Bed Mobility               General bed mobility comments: pt up in bathroom upon arrival.  Transfers Overall transfer level: Needs assistance Equipment used: Rolling walker (2 wheeled) Transfers: Sit to/from Stand Sit to Stand: Min guard         General transfer comment: cues for UE use and controlling descent to sitting as pt tends to sit quickly.    Ambulation/Gait Ambulation/Gait assistance: Min guard Ambulation Distance (Feet): 75 Feet Assistive device: Rolling walker (2 wheeled) Gait Pattern/deviations: Step-through pattern;Decreased stride length;Trunk flexed     General Gait Details: pt moves slowly and with flexed posture, but this appeasrs to be baseline for pt.  pt indicates feeling something sticking intot he bottom of his foot.  Sock removed and dressings inspected without anything obvious.  pt and daughter  then relate that pt has something imbedded in the sole of his foot and that he normally wears his sneakers to ambulate.     Stairs            Wheelchair Mobility    Modified Rankin (Stroke Patients Only)       Balance Overall balance assessment: Needs assistance Sitting-balance support: No upper extremity supported;Feet supported Sitting balance-Leahy Scale: Good     Standing balance support: Bilateral upper extremity supported;Single extremity supported;During functional activity Standing balance-Leahy Scale: Fair                      Cognition Arousal/Alertness: Awake/alert Behavior During Therapy: WFL for tasks assessed/performed Overall Cognitive Status: Within Functional Limits for tasks assessed                      Exercises      General Comments        Pertinent Vitals/Pain Pain Assessment: No/denies pain    Home Living                      Prior Function            PT Goals (current goals can now be found in the care plan section) Acute Rehab PT Goals PT Goal Formulation: With patient Time For Goal Achievement: 08/02/15 Potential to Achieve Goals: Good Progress towards PT goals: Progressing toward goals  Frequency  Min 3X/week    PT Plan Current plan remains appropriate    Co-evaluation             End of Session Equipment Utilized During Treatment: Gait belt Activity Tolerance: Patient tolerated treatment well Patient left: in chair;with call bell/phone within reach     Time: [id number] PT Time Calculation (min) (ACUTE ONLY): 36 min  Charges:  $Gait Training: 23-37 mins                    G CodesCatarina Hartshorn, [place] 07/21/2015, 10:34 AM

## 2015-07-21 NOTE — Discharge Summary (Addendum)
Physician Discharge Summary  Oscar Bruce [id number] DOB: 06-07-1924 DOA: 07/17/2015  PCP: Horatio Pel, MD  Admit date: 07/17/2015 Discharge date: 07/21/2015  Admitted From: home Disposition:  SNF  Recommendations for Outpatient Follow-up:  1. Follow up with PCP in 1-2 weeks 2. Please obtain BMP in 4 days 3. Lasix was changed to daily PRN, please monitor fluid status with scheduled weights 4. ACEI discontinued on d/c 5. Wound center appointment in 4 days for [employer] management 6. Discharged on Doxycycline for 5 additional days to end on 7/8   Discharge Condition: stable CODE STATUS: Full Diet recommendation: heart healthy  Discharge Diagnoses:  Principal Problem:   Cellulitis of right lower leg Active Problems:   Venous stasis ulcer of right lower extremity ([place])   Neoplasm of skin of ear  HPI Oscar Bruce is a 82 y.o. male with medical history significant of DM, diastolic CHF, venous stasis ulcers of legs. Patient presents to the ED with c/o erythema, skin tear of RLE shin. Surrounding erythema. Symptoms onset a couple of days ago, have been persistent. Associated serous drainage, hasnt been on any ABx recently for this. H/o multiple bouts of cellulitis like this in the past.  Hospital course RLE cellulitis / wounds - patient started on broad spectrum antibiotics with improvement in his cellulitis. Wound RN consulted and patient started on Unna boot. He already has an appointment at the Wound care center this Friday. His antibiotics were narrowed to po Doxycycline which he is to continue for 5 additional days, his wounds will be evaluated this Friday and defer to wound center should he need a longer course.  Probable skin cancer - no derm available as inpatient, outpatient management. Its been present for several months. Discussed with daughter, will set up dermatology follow up as an outpatient.  Cough - chronic, CXR negative for acute findings. ACEI  discontinued as below.  Deconditioning - PT recommending SNF, patient agreeable DM  - continue home meds, insulin. He had several episodes of asymptomatic fasting hypoglycemia and his insulin regimen was adjusted as below.   HTN - continue Metoprolol, clonidine A fib / PPM - continue Coumadin, Metoprolol, DIgoxin CKD III - GFR in the 50s in 2011, baseline Cr ~ 1.6 seen in [facility] (care everywhere). He is close to baseline and stable. Will hold ACEI and change his Lasix to daily PRN, please ensure regular weights to prevent fluid overload. Repeat BMP in 3-4 days at [facility].   Discharge Instructions     Medication List    STOP taking these medications        ramipril 5 MG capsule  Commonly known as:  ALTACE      TAKE these medications        acetaminophen 500 MG chewable tablet  Commonly known as:  TYLENOL  Chew 500 mg by mouth every 6 (six) hours as needed. For pain     atorvastatin 40 MG tablet  Commonly known as:  LIPITOR  Take 20 mg by mouth daily.     BYETTA 5 MCG PEN [place]  Inject 5 mg into the skin daily.     clindamycin 150 MG capsule  Commonly known as:  CLEOCIN  Take by mouth. Take 4 capsules 1 hour before dentist     cloNIDine 0.2 MG tablet  Commonly known as:  CATAPRES  TAKE 2 TABLETS BY MOUTH THREE TIMES DAILY     cyanocobalamin 100 MCG tablet  Take 100 mcg by mouth daily.  digoxin 0.25 MG tablet  Commonly known as:  LANOXIN  Take 0.125 mg by mouth daily.     doxycycline 100 MG capsule  Commonly known as:  VIBRAMYCIN  Take 1 capsule (100 mg total) by mouth 2 (two) times daily.     furosemide 40 MG tablet  Commonly known as:  LASIX  Take 1 tablet (40 mg total) by mouth daily as needed for fluid or edema. Take 1 and 1/2 daily     insulin detemir 100 UNIT/ML injection  Commonly known as:  LEVEMIR  Inject 0.08 mLs (8 Units total) into the skin at bedtime.     MAALOX 600 MG chewable tablet  Generic drug:  Calcium Carbonate Antacid  Chew 600 mg by mouth as  needed.     metoprolol succinate 50 MG 24 hr tablet  Commonly known as:  TOPROL-XL  Take 75 mg by mouth daily. Take with or immediately following a meal.     NITROSTAT 0.4 MG SL tablet  Generic drug:  nitroGLYCERIN  Place 0.4 mg under the tongue every 5 (five) minutes as needed.     warfarin 5 MG tablet  Commonly known as:  COUMADIN  Take 2.5-5 mg by mouth See admin instructions. Take 5mg  every day except on Mon, Wed, Fri and Saturday take 2.5mg        Follow-up Information    Follow up with [facility]              In 4 days.   Why:  already scheduled, for Unna boot change   Contact information:   [address] [phone]     Allergies  Allergen Reactions  . Penicillins Swelling    Has patient had a PCN reaction causing immediate rash, facial/tongue/throat swelling, SOB or lightheadedness with hypotension: NO Has patient had a PCN reaction causing severe rash involving mucus membranes or skin necrosis: NO Has patient had a PCN reaction that required hospitalization NO Has patient had a PCN reaction occurring within the last 10 years: NO If all of the above answers are "NO", then may proceed with Cephalosporin use.    Consultations:    Procedures/Studies: Dg Chest 2 View  07/20/2015  CLINICAL DATA:  Wheezing. Pt is currently in the hospital for an DM related right foot infection. Hx of DM, HTN, cancer, CHF, CAD, A-fib EXAM: CHEST - 2 VIEW COMPARISON:  07/17/2015 FINDINGS: Stable linear scarring or subsegmental atelectasis at the left lung base. Right lung clear. Heart size upper limits normal. Atheromatous aorta. Previous CABG. Stable right subclavian transvenous pacemaker. No effusion. IMPRESSION: 1. Stable chronic and postoperative changes.  No acute disease. Electronically Signed   By: Lucrezia Europe M.D.   On: 07/20/2015 08:44   Dg Chest Port 1 View  07/17/2015  CLINICAL DATA:  Cough and  congestion for 1 week. EXAM: PORTABLE CHEST 1 VIEW COMPARISON:  02/16/2011 FINDINGS: Intact appearances of the transvenous cardiac leads. The lungs are clear. The pulmonary vasculature is normal. There is no large effusion. Hilar and mediastinal contours are unremarkable and unchanged. IMPRESSION: No acute cardiopulmonary findings. Electronically Signed   By: Andreas Newport M.D.   On: 07/17/2015 22:55     Subjective:   Discharge Exam: Filed Vitals:   07/20/15 2136 07/21/15 0500  BP: 124/60 149/60  Pulse: 62 63  Temp: 98.5 F (36.9 C) 97.5 F (36.4 C)  Resp: 18 18   Filed Vitals:   07/20/15  [id number] 07/20/15 1328 07/20/15 2136 07/21/15 0500  BP:  131/57 124/60 149/60  Pulse: 62 64 62 63  Temp:  98.2 F (36.8 C) 98.5 F (36.9 C) 97.5 F (36.4 C)  TempSrc:  Oral Oral Oral  Resp: 16 17 18 18   Height:      Weight:      SpO2: 100% 100% 97% 99%    General: Pt is alert, awake, not in acute distress Cardiovascular: irregular, no murmurs Respiratory: CTA bilaterally, no wheezing, no rhonchi Extremities: bilateral LE Unna boot  The results of significant diagnostics from this hospitalization (including imaging, microbiology, ancillary and laboratory) are listed below for reference.     Microbiology: No results found for this or any previous visit (from the past 240 hour(s)).   Labs: BNP (last 3 results)  Recent Labs  07/17/15 2200  BNP 0000000*   Basic Metabolic Panel:  Recent Labs Lab 07/17/15 2200 07/19/15 0714 07/20/15 0526 07/21/15 0530  NA 136 137 136 133*  K 3.3* 3.6 3.6 3.8  CL 101 100* 101 100*  CO2 26 28 26 25   GLUCOSE 191* 63* 55* 64*  BUN 29* 35* 44* 50*  CREATININE 1.54* 1.66* 1.71* 1.78*  CALCIUM 8.4* 8.3* 8.4* 8.4*   Liver Function Tests:  Recent Labs Lab 07/17/15 2200  AST 38  ALT 21  ALKPHOS 148*  BILITOT 0.8  PROT 7.4  ALBUMIN 3.1*   CBC:  Recent Labs Lab 07/17/15 2200 07/19/15 0714 07/20/15 0526  WBC 9.7 11.0* 13.5*    NEUTROABS 6.3  --   --   HGB 13.0 12.1* 13.0  HCT 41.1 38.1* 40.2  MCV 91.5 91.4 91.6  PLT 188 173 186   CBG:  Recent Labs Lab 07/20/15 0850 07/20/15 1159 07/20/15 1711 07/20/15 2134 07/21/15 0805  GLUCAP 89 122* 158* 165* 46*    Time coordinating discharge: Over 30 minutes  SIGNED:  Marzetta Board, MD  [facility] 07/21/2015, 10:03 AM Pager [phone]  If 7PM-7AM, please contact night-coverage [email or web address] Password [email or web address]

## 2015-07-21 NOTE — Progress Notes (Signed)
Discussed discharge summary with patient. Reviewed all medications with patients daughter. Attempted to call report to [facility] but no one was available. Left a message on Directors answering machine to return call. Patient has left already with daughter to go to [facility].

## 2015-07-21 NOTE — Clinical Social Work Note (Signed)
Patient to be discharged to [facility]. Patient's daughter, Santiago Glad, updated regarding discharge. Patient to be transported via EMS. RN report number: [place], [place] [facility]: [phone] Surgical: [phone]

## 2015-07-21 NOTE — Progress Notes (Addendum)
ANTICOAGULATION CONSULT NOTE - Follow Up Consult  Pharmacy Consult for Warfarin Indication: atrial fibrillation  Allergies  Allergen Reactions  . Penicillins Swelling    Has patient had a PCN reaction causing immediate rash, facial/tongue/throat swelling, SOB or lightheadedness with hypotension: NO Has patient had a PCN reaction causing severe rash involving mucus membranes or skin necrosis: NO Has patient had a PCN reaction that required hospitalization NO Has patient had a PCN reaction occurring within the last 10 years: NO If all of the above answers are "NO", then may proceed with Cephalosporin use.    Patient Measurements: Height: 5\' 8"  (172.7 cm) Weight: 163 lb (73.936 kg) IBW/kg (Calculated) : 68.4  Vital Signs: Temp: 97.5 F (36.4 C) (07/03 0500) Temp Source: Oral (07/03 0500) BP: 149/60 mmHg (07/03 0500) Pulse Rate: 63 (07/03 0500)  Labs:  Recent Labs  07/19/15 0714 07/20/15 0526 07/21/15 0410 07/21/15 0530  HGB 12.1* 13.0  --   --   HCT 38.1* 40.2  --   --   PLT 173 186  --   --   LABPROT 25.3* 24.3* 24.0*  --   INR 2.33* 2.20* 2.17*  --   CREATININE 1.66* 1.71*  --  1.78*    Estimated Creatinine Clearance: 27.2 mL/min (by C-G formula based on Cr of 1.78).   Assessment: 79 YOM who continues on warfarin from PTA for hx Afib.  INR today is therapeutic (INR 2.17 << 2.2, goal of 2-3). CBC stable - no overt s/sx of bleeding noted. Will continue with PTA dosing.  Goal of Therapy:  INR 2-3   Plan:  1. Continue Warfarin 2.5 mg/day EXCEPT for 5 mg on T/Th/Sun - dose this evening is 2.5 mg x 1 2. Will continue to monitor for any signs/symptoms of bleeding and will follow up with PT/INR in the a.m.   Alycia Rossetti, PharmD, BCPS Clinical Pharmacist Pager: [phone] 07/21/2015 9:33 AM

## 2015-07-21 NOTE — Clinical Social Work Placement (Signed)
   CLINICAL SOCIAL WORK PLACEMENT  NOTE  Date:  07/21/2015  Patient Details  Name: Oscar Bruce MRN: [id number] Date of Birth: Jul 10, 1924  Clinical Social Work is seeking post-discharge placement for this patient at the [place] level of care (*CSW will initial, date and re-position this form in  chart as items are completed):  Yes   Patient/family provided with [place] Work Department's list of facilities offering this level of care within the geographic area requested by the patient (or if unable, by the patient's family).  Yes   Patient/family informed of their freedom to choose among providers that offer the needed level of care, that participate in Medicare, Medicaid or managed care program needed by the patient, have an available bed and are willing to accept the patient.  Yes   Patient/family informed of [facility]'s ownership interest in [facility] and [facility], as well as of the fact that they are under no obligation to receive care at these facilities.  PASRR submitted to EDS on 07/19/15     PASRR number received on 07/19/15     Existing PASRR number confirmed on       FL2 transmitted to all facilities in geographic area requested by pt/family on 07/19/15     FL2 transmitted to all facilities within larger geographic area on       Patient informed that his/her managed care company has contracts with or will negotiate with certain facilities, including the following:        Yes   Patient/family informed of bed offers received.  Patient chooses bed at [facility]     Physician recommends and patient chooses bed at      Patient to be transferred to [facility] on 07/21/15.  Patient to be transferred to facility by [facility]     Patient family notified on 07/21/15 of transfer.  Name of family member notified:  Daughter, Santiago Glad     PHYSICIAN Please prepare priority discharge summary, including medications, Please prepare  prescriptions, Please sign FL2     Additional Comment:    _______________________________________________ Caroline Sauger, LCSW 07/21/2015, 12:35 PM

## 2015-07-23 ENCOUNTER — Non-Acute Institutional Stay (SKILLED_NURSING_FACILITY): Payer: Medicare Other | Admitting: Adult Health

## 2015-07-23 ENCOUNTER — Encounter (HOSPITAL_COMMUNITY): Payer: Medicare Other

## 2015-07-23 ENCOUNTER — Encounter: Payer: Self-pay | Admitting: Adult Health

## 2015-07-23 ENCOUNTER — Encounter (HOSPITAL_COMMUNITY): Payer: Self-pay

## 2015-07-23 DIAGNOSIS — I509 Heart failure, unspecified: Secondary | ICD-10-CM | POA: Diagnosis not present

## 2015-07-23 DIAGNOSIS — E785 Hyperlipidemia, unspecified: Secondary | ICD-10-CM

## 2015-07-23 DIAGNOSIS — D72829 Elevated white blood cell count, unspecified: Secondary | ICD-10-CM

## 2015-07-23 DIAGNOSIS — R05 Cough: Secondary | ICD-10-CM

## 2015-07-23 DIAGNOSIS — N183 Chronic kidney disease, stage 3 unspecified: Secondary | ICD-10-CM

## 2015-07-23 DIAGNOSIS — I2510 Atherosclerotic heart disease of native coronary artery without angina pectoris: Secondary | ICD-10-CM | POA: Diagnosis not present

## 2015-07-23 DIAGNOSIS — R5381 Other malaise: Secondary | ICD-10-CM | POA: Diagnosis not present

## 2015-07-23 DIAGNOSIS — D492 Neoplasm of unspecified behavior of bone, soft tissue, and skin: Secondary | ICD-10-CM

## 2015-07-23 DIAGNOSIS — D485 Neoplasm of uncertain behavior of skin: Secondary | ICD-10-CM

## 2015-07-23 DIAGNOSIS — L03115 Cellulitis of right lower limb: Secondary | ICD-10-CM | POA: Diagnosis not present

## 2015-07-23 DIAGNOSIS — I4891 Unspecified atrial fibrillation: Secondary | ICD-10-CM

## 2015-07-23 DIAGNOSIS — E46 Unspecified protein-calorie malnutrition: Secondary | ICD-10-CM

## 2015-07-23 DIAGNOSIS — I10 Essential (primary) hypertension: Secondary | ICD-10-CM | POA: Diagnosis not present

## 2015-07-23 DIAGNOSIS — Z794 Long term (current) use of insulin: Secondary | ICD-10-CM

## 2015-07-23 DIAGNOSIS — R059 Cough, unspecified: Secondary | ICD-10-CM

## 2015-07-23 DIAGNOSIS — E1122 Type 2 diabetes mellitus with diabetic chronic kidney disease: Secondary | ICD-10-CM

## 2015-07-23 NOTE — Progress Notes (Signed)
Patient ID: Oscar Bruce, male   DOB: 07-27-1924, 82 y.o.   MRN: [id number]    DATE:  07/23/2015   MRN:  [id number]  BIRTHDAY: 1924-04-08  Facility:  Nursing Home Location:  [place] Room Number: [id number]  LEVEL OF CARE:  SNF [phone])  Contact Information    Name Relation Home Work Mobile   Crump,Karen Daughter [phone]   Maynor, [place] Daughter [phone]         Code Status History    Date Active Date Inactive Code Status Order ID Comments User Context   07/18/2015 12:02 AM 07/21/2015  8:30 PM Full Code [id number]  Etta Quill, DO ED       Chief Complaint  Patient presents with  . Hospitalization Follow-up    HISTORY OF PRESENT ILLNESS:  This is an 82 year old male who has been admitted to [facility] on 07/21/15 from [facility]. She has PMH of diabetes mellitus, diastolic CHF and venous stasis ulcers of legs. He was treated in the hospital from 07/19/15 to 07/21/15. He was treated for RLE cellulitis. He was started on broad spectrum antibiotic, wound RN consulted and Haematologist. He was discharged on doxycycline for 5 additional days and has an appointment with the wound clinic. ACEI was discontinued due to chronic cough with chest x-ray being negative.  He has been admitted for a short-term rehabilitation.   PAST MEDICAL HISTORY:  Past Medical History  Diagnosis Date  . A-fib ([place])   . Hypertension   . CAD (coronary artery disease)   . Diabetes mellitus   . CHF (congestive heart failure) ([place])   . Cancer [facility])     Colon     CURRENT MEDICATIONS: Reviewed  Patient's Medications  New Prescriptions   No medications on file  Previous Medications   ACETAMINOPHEN (TYLENOL) 500 MG CHEWABLE TABLET    Chew 500 mg by mouth every 6 (six) hours as needed. For pain   ATORVASTATIN (LIPITOR) 20 MG TABLET    Take 20 mg by mouth daily.   CALCIUM CARBONATE ANTACID (MAALOX) 600 MG CHEWABLE TABLET    Chew 600 mg  by mouth as needed.   CLINDAMYCIN (CLEOCIN) 150 MG CAPSULE    Take 150 mg by mouth. Reported on 07/23/2015 - Take 4 capsules one hour before dentist   CLONIDINE (CATAPRES) 0.2 MG TABLET    TAKE 2 TABLETS BY MOUTH THREE TIMES DAILY   CYANOCOBALAMIN 100 MCG TABLET    Take 100 mcg by mouth daily.   DIGOXIN (LANOXIN) 0.125 MG TABLET    Take 0.125 mg by mouth daily.   DOXYCYCLINE (VIBRAMYCIN) 100 MG CAPSULE    Take 1 capsule (100 mg total) by mouth 2 (two) times daily.   EXENATIDE (BYETTA 5 MCG PEN [place])    Inject 5 mg into the skin daily.    FUROSEMIDE (LASIX) 40 MG TABLET    Take 1 tablet (40 mg total) by mouth daily as needed for fluid or edema. Take 1 and 1/2 daily   INSULIN DETEMIR (LEVEMIR) 100 UNIT/ML INJECTION    Inject 0.08 mLs (8 Units total) into the skin at bedtime.   METOPROLOL SUCCINATE (TOPROL-XL) 50 MG 24 HR TABLET    Take 75 mg by mouth daily. Take with or immediately following a meal.   NITROGLYCERIN (NITROSTAT) 0.4 MG SL TABLET    Place 0.4 mg under the tongue every 5 (five) minutes as needed.     WARFARIN (COUMADIN)  5 MG TABLET    Take 2.5-5 mg by mouth See admin instructions. Take 5mg  every day except on Mon, Wed, Fri and Saturday take 2.5mg   Modified Medications   No medications on file  Discontinued Medications   ATORVASTATIN (LIPITOR) 40 MG TABLET    Take 20 mg by mouth daily.    DIGOXIN (LANOXIN) 0.25 MG TABLET    Take 0.125 mg by mouth daily.      Allergies  Allergen Reactions  . Penicillins Swelling    Has patient had a PCN reaction causing immediate rash, facial/tongue/throat swelling, SOB or lightheadedness with hypotension: NO Has patient had a PCN reaction causing severe rash involving mucus membranes or skin necrosis: NO Has patient had a PCN reaction that required hospitalization NO Has patient had a PCN reaction occurring within the last 10 years: NO If all of the above answers are "NO", then may proceed with Cephalosporin use.     REVIEW OF  SYSTEMS:  GENERAL: no change in appetite, no fatigue, no weight changes, no fever, chills or weakness EYES: Denies change in vision, dry eyes, eye pain, itching or discharge EARS: Denies change in hearing, ringing in ears, or earache NOSE: Denies nasal congestion or epistaxis MOUTH and THROAT: Denies oral discomfort, gingival pain or bleeding, pain from teeth or hoarseness   RESPIRATORY: no cough, SOB, DOE, wheezing, hemoptysis CARDIAC: no chest pain, edema or palpitations GI: no abdominal pain, diarrhea, constipation, heart burn, nausea or vomiting GU: Denies dysuria, frequency, hematuria, incontinence, or discharge PSYCHIATRIC: Denies feeling of depression or anxiety. No report of hallucinations, insomnia, paranoia, or agitation   PHYSICAL EXAMINATION  GENERAL APPEARANCE: Well nourished. In no acute distress. Normal body habitus SKIN:  Right ear lesion; BLE has unna boots HEAD: Normal in size and contour. No evidence of trauma EYES: Lids open and close normally. No blepharitis, entropion or ectropion. PERRL. Conjunctivae are clear and sclerae are white. Lenses are without opacity EARS: Pinnae are normal. Patient hears normal voice tunes of the examiner MOUTH and THROAT: Lips are without lesions. Oral mucosa is moist and without lesions. Tongue is normal in shape, size, and color and without lesions NECK: supple, trachea midline, no neck masses, no thyroid tenderness, no thyromegaly LYMPHATICS: no LAN in the neck, no supraclavicular LAN RESPIRATORY: breathing is even & unlabored, BS CTAB CARDIAC: RRR, no murmur,no extra heart sounds, no edema, has pacemaker GI: abdomen soft, normal BS, no masses, no tenderness, no hepatomegaly, no splenomegaly EXTREMITIES:  Able to move X 4 extremities PSYCHIATRIC: Alert and oriented X 3. Affect and behavior are appropriate  LABS/RADIOLOGY: Labs reviewed: Basic Metabolic Panel:  Recent Labs  07/19/15 0714 07/20/15 0526 07/21/15 0530  NA 137  136 133*  K 3.6 3.6 3.8  CL 100* 101 100*  CO2 28 26 25   GLUCOSE 63* 55* 64*  BUN 35* 44* 50*  CREATININE 1.66* 1.71* 1.78*  CALCIUM 8.3* 8.4* 8.4*   Liver Function Tests:  Recent Labs  07/17/15 2200  AST 38  ALT 21  ALKPHOS 148*  BILITOT 0.8  PROT 7.4  ALBUMIN 3.1*   CBC:  Recent Labs  07/17/15 2200 07/19/15 0714 07/20/15 0526  WBC 9.7 11.0* 13.5*  NEUTROABS 6.3  --   --   HGB 13.0 12.1* 13.0  HCT 41.1 38.1* 40.2  MCV 91.5 91.4 91.6  PLT 188 173 186   CBG:  Recent Labs  07/20/15 2134 07/21/15 0805 07/21/15 1204  GLUCAP 165* 46* 146*     Dg Chest  2 View  07/20/2015  CLINICAL DATA:  Wheezing. Pt is currently in the hospital for an DM related right foot infection. Hx of DM, HTN, cancer, CHF, CAD, A-fib EXAM: CHEST - 2 VIEW COMPARISON:  07/17/2015 FINDINGS: Stable linear scarring or subsegmental atelectasis at the left lung base. Right lung clear. Heart size upper limits normal. Atheromatous aorta. Previous CABG. Stable right subclavian transvenous pacemaker. No effusion. IMPRESSION: 1. Stable chronic and postoperative changes.  No acute disease. Electronically Signed   By: Lucrezia Europe M.D.   On: 07/20/2015 08:44   Dg Chest Port 1 View  07/17/2015  CLINICAL DATA:  Cough and congestion for 1 week. EXAM: PORTABLE CHEST 1 VIEW COMPARISON:  02/16/2011 FINDINGS: Intact appearances of the transvenous cardiac leads. The lungs are clear. The pulmonary vasculature is normal. There is no large effusion. Hilar and mediastinal contours are unremarkable and unchanged. IMPRESSION: No acute cardiopulmonary findings. Electronically Signed   By: Andreas Newport M.D.   On: 07/17/2015 22:55    ASSESSMENT/PLAN:  Physical deconditioning - for rehabilitation, PT and OT  RLE cellulitis - continue doxycycline 100 mg 1 tab by mouth twice a day till 07/26/15; follow-up with wound clinic  Diabetes mellitus, type II  - continue  Byetta 5 mcg SQ daily and Levemir 100 units/ml inject 8 units  SQ Q HS; check HgbA1c  Atrial fibrillation  - rate controlled; continue digitoxin 0.125 mg 1 tab by mouth daily, metoprolol succinate ER 50 mg take 1 1/2 tab = 75 mg by mouth daily and Coumadin 5 mg by mouth every Tuesday, Thursday and Sunday and Coumadin 2.5 mg by mouth every Monday, Wednesday, Friday and Saturday  Hyperlipidemia  - continue Lipitor 20 mg 1 tab by mouth daily; check lipid panel  Chronic kidney disease, stage III - check BMP; Lasix 60 mg daily was changed to PRN from daily  Protein calorie malnutrition - albumin 3.1; RD consult  Leukocytosis - wbc 13.5; no fever and currently on Doxycycline; check CBC  Hypertension - continue metoprolol succinate ER 50 mg take 1 1/2 tab = 75 mg by mouth daily and clonidine 0.2 mg take 2 tabs = 0.4 mg PO TID  Right ear skin neoplasm - dermatology consult  Cough - CXR was negative; ACEI was discontinued in the hospital  CHF - Lasix 60 mg daily was changed to PRN; weigh Q M-W-F  CAD - stable; continue NTG PRN      Goals of care:  Short-term rehabilitation    Durenda Age, NP [facility] [phone]

## 2015-07-24 ENCOUNTER — Encounter: Payer: Self-pay | Admitting: Internal Medicine

## 2015-07-24 ENCOUNTER — Other Ambulatory Visit: Payer: Self-pay | Admitting: *Deleted

## 2015-07-24 ENCOUNTER — Non-Acute Institutional Stay (SKILLED_NURSING_FACILITY): Payer: Medicare Other | Admitting: Internal Medicine

## 2015-07-24 ENCOUNTER — Encounter: Payer: Medicare Other | Admitting: Internal Medicine

## 2015-07-24 DIAGNOSIS — I739 Peripheral vascular disease, unspecified: Secondary | ICD-10-CM

## 2015-07-24 DIAGNOSIS — D72829 Elevated white blood cell count, unspecified: Secondary | ICD-10-CM

## 2015-07-24 DIAGNOSIS — L03115 Cellulitis of right lower limb: Secondary | ICD-10-CM | POA: Diagnosis not present

## 2015-07-24 DIAGNOSIS — E785 Hyperlipidemia, unspecified: Secondary | ICD-10-CM

## 2015-07-24 DIAGNOSIS — W19XXXA Unspecified fall, initial encounter: Secondary | ICD-10-CM | POA: Diagnosis not present

## 2015-07-24 DIAGNOSIS — I5032 Chronic diastolic (congestive) heart failure: Secondary | ICD-10-CM | POA: Diagnosis not present

## 2015-07-24 DIAGNOSIS — R05 Cough: Secondary | ICD-10-CM | POA: Diagnosis not present

## 2015-07-24 DIAGNOSIS — I2510 Atherosclerotic heart disease of native coronary artery without angina pectoris: Secondary | ICD-10-CM

## 2015-07-24 DIAGNOSIS — I4891 Unspecified atrial fibrillation: Secondary | ICD-10-CM

## 2015-07-24 DIAGNOSIS — E46 Unspecified protein-calorie malnutrition: Secondary | ICD-10-CM | POA: Diagnosis not present

## 2015-07-24 DIAGNOSIS — N183 Chronic kidney disease, stage 3 (moderate): Secondary | ICD-10-CM | POA: Diagnosis not present

## 2015-07-24 DIAGNOSIS — E119 Type 2 diabetes mellitus without complications: Secondary | ICD-10-CM

## 2015-07-24 DIAGNOSIS — I10 Essential (primary) hypertension: Secondary | ICD-10-CM

## 2015-07-24 DIAGNOSIS — R053 Chronic cough: Secondary | ICD-10-CM

## 2015-07-24 DIAGNOSIS — E871 Hypo-osmolality and hyponatremia: Secondary | ICD-10-CM | POA: Diagnosis not present

## 2015-07-24 DIAGNOSIS — D492 Neoplasm of unspecified behavior of bone, soft tissue, and skin: Secondary | ICD-10-CM

## 2015-07-24 DIAGNOSIS — R531 Weakness: Secondary | ICD-10-CM | POA: Diagnosis not present

## 2015-07-24 DIAGNOSIS — D485 Neoplasm of uncertain behavior of skin: Secondary | ICD-10-CM

## 2015-07-24 LAB — CBC AND DIFFERENTIAL
HCT: 40 % — AB (ref 41–53)
Hemoglobin: 13 g/dL — AB (ref 13.5–17.5)
NEUTROS ABS: 5 /uL
PLATELETS: 258 10*3/uL (ref 150–399)
WBC: 8.2 10*3/mL

## 2015-07-24 LAB — LIPID PANEL
CHOLESTEROL: 83 mg/dL (ref 0–200)
HDL: 33 mg/dL — AB (ref 35–70)
LDL CALC: 39 mg/dL
TRIGLYCERIDES: 57 mg/dL (ref 40–160)

## 2015-07-24 LAB — BASIC METABOLIC PANEL
BUN: 55 mg/dL — AB (ref 4–21)
CREATININE: 1.7 mg/dL — AB (ref 0.6–1.3)
Glucose: 103 mg/dL
Potassium: 4.4 mmol/L (ref 3.4–5.3)
Sodium: 138 mmol/L (ref 137–147)

## 2015-07-24 LAB — HEMOGLOBIN A1C: HEMOGLOBIN A1C: 7.2

## 2015-07-24 NOTE — Progress Notes (Signed)
LOCATION: [place]  PCP: Horatio Pel, MD   Code Status: Full Code   Goals of care: Advanced Directive information Advanced Directives 07/17/2015  Does patient have an advance directive? No  Would patient like information on creating an advanced directive? No - patient declined information       Extended Emergency Contact Information Primary Emergency Contact: Crump,Karen Address: [place], [place] Phone: [phone] Work Phone: [phone] Mobile Phone: [phone] Relation: Daughter Secondary Emergency Contact: Ollen Gross [place] Phone: [phone] Relation: Daughter   Allergies  Allergen Reactions  . Penicillins Swelling    Has patient had a PCN reaction causing immediate rash, facial/tongue/throat swelling, SOB or lightheadedness with hypotension: NO Has patient had a PCN reaction causing severe rash involving mucus membranes or skin necrosis: NO Has patient had a PCN reaction that required hospitalization NO Has patient had a PCN reaction occurring within the last 10 years: NO If all of the above answers are "NO", then may proceed with Cephalosporin use.    Chief Complaint  Patient presents with  . New Admit To SNF    New Admission     HPI:  Patient is a 82 y.o. male seen today for short term rehabilitation post hospital admission from 07/17/15-07/21/15 with right lower extremity wound with cellulitis. He was started on antibiotics and wound care nurse consulted. He has unna boot and was switched to po antibiotics at discharge. There was concern of his ACEI contributing to cough and this was stopped this admission. He has PMH of ckd 3, afib, HTN, DM and PVD among others. He is seen in his room today.     Review of Systems:  Constitutional: Negative for fever, chills, diaphoresis. Energy level is slowly coming back.  HENT: Negative for headache, congestion,  difficulty swallowing. Positive for occasional nasal discharge.   Eyes: Negative for blurred vision, double vision and discharge. Wears glasses.  Respiratory: Negative for shortness of breath and wheezing. Positive for dry cough.   Cardiovascular: Negative for chest pain, palpitation. Positive for chronic leg edema. Gastrointestinal: Negative for heartburn, nausea, melena, vomiting. Positive for LLQ abdominal pain when changing position. Has right inguinal hernia. Last bowel movement was yesterday.  Genitourinary: Negative for dysuria.  Musculoskeletal: Negative for back pain. Positive for mechanical fall yesterday in the bathroom where he lost his balance last night and fell on his bottom and hit his head on the wall.   Skin: Negative for itching, rash.  Neurological: Negative for dizziness. Psychiatric/Behavioral: Negative for depression.    Past Medical History  Diagnosis Date  . A-fib ([place])   . Hypertension   . CAD (coronary artery disease)   . Diabetes mellitus   . CHF (congestive heart failure) ([place])   . Cancer [facility])     Colon   Past Surgical History  Procedure Laterality Date  . Cholecystectomy    . Coronary artery bypass graft    . Pacemaker insertion    . Cardiac catheterization  2001, 2004  . Cataract extraction  2000   Social History:   reports that he has never smoked. He does not have any smokeless tobacco history on file. He reports that he does not drink alcohol or use illicit drugs.  Family History  Problem Relation Age of Onset  . Diabetes Mother   . Coronary artery disease Father   . Diabetes Sister  Medications:   Medication List       This list is accurate as of: 07/24/15 11:27 AM.  Always use your most recent med list.               acetaminophen 500 MG chewable tablet  Commonly known as:  TYLENOL  Chew 500 mg by mouth every 6 (six) hours as needed. For pain     atorvastatin 20 MG tablet  Commonly known as:  LIPITOR  Take 20 mg by mouth  daily.     BYETTA 5 MCG PEN [place]  Inject 5 mg into the skin daily.     cloNIDine 0.2 MG tablet  Commonly known as:  CATAPRES  TAKE 2 TABLETS BY MOUTH THREE TIMES DAILY     cyanocobalamin 100 MCG tablet  Take 100 mcg by mouth daily.     digoxin 0.125 MG tablet  Commonly known as:  LANOXIN  Take 0.125 mg by mouth daily.     doxycycline 100 MG capsule  Commonly known as:  VIBRAMYCIN  Take 1 capsule (100 mg total) by mouth 2 (two) times daily.     furosemide 40 MG tablet  Commonly known as:  LASIX  Take 1 tablet (40 mg total) by mouth daily as needed for fluid or edema. Take 1 and 1/2 daily     insulin detemir 100 UNIT/ML injection  Commonly known as:  LEVEMIR  Inject 0.08 mLs (8 Units total) into the skin at bedtime.     MAALOX 600 MG chewable tablet  Generic drug:  Calcium Carbonate Antacid  Chew 600 mg by mouth daily as needed.     metoprolol succinate 50 MG 24 hr tablet  Commonly known as:  TOPROL-XL  Take 75 mg by mouth daily. Take with or immediately following a meal.     NITROSTAT 0.4 MG SL tablet  Generic drug:  nitroGLYCERIN  Place 0.4 mg under the tongue every 5 (five) minutes as needed.     warfarin 5 MG tablet  Commonly known as:  COUMADIN  Take 2.5-5 mg by mouth See admin instructions. Take 5mg  every day except on Mon, Wed, Fri and Saturday take 2.5mg         Immunizations: Immunization History  Administered Date(s) Administered  . Influenza,inj,Quad PF,36+ Mos 11/13/2013  . PPD Test 07/21/2015     Physical Exam: Filed Vitals:   07/24/15 1120  BP: 158/72  Pulse: 64  Temp: 97.6 F (36.4 C)  TempSrc: Oral  Resp: 18  Height: 5\' 8"  (1.727 m)  Weight: 163 lb (73.936 kg)  SpO2: 96%   Body mass index is 24.79 kg/(m^2).  General- elderly frail male, well built, in no acute distress Head- normocephalic, atraumatic Nose- no nasal discharge Throat- moist mucus membrane, some missing teeth Eyes- PERRLA, EOMI, no pallor, no icterus, no discharge,  normal conjunctiva, normal sclera Neck- no cervical lymphadenopathy Cardiovascular- normal s1,s2, no murmur Respiratory- bilateral clear to auscultation, no wheeze, no rhonchi, + basilar crackles, no use of accessory muscles Abdomen- bowel sounds present, soft, non tender Musculoskeletal- able to move all 4 extremities, generalized weakness, leg edema present, unna boot to both legs, arthritis changes to his fingers Neurological- alert and oriented to person, place and time Skin- warm and dry, easy bruising Psychiatry- normal mood and affect    Labs reviewed: Basic Metabolic Panel:  Recent Labs  07/19/15 0714 07/20/15 0526 07/21/15 0530  NA 137 136 133*  K 3.6 3.6 3.8  CL 100* 101 100*  CO2  28 26 25   GLUCOSE 63* 55* 64*  BUN 35* 44* 50*  CREATININE 1.66* 1.71* 1.78*  CALCIUM 8.3* 8.4* 8.4*   Liver Function Tests:  Recent Labs  07/17/15 2200  AST 38  ALT 21  ALKPHOS 148*  BILITOT 0.8  PROT 7.4  ALBUMIN 3.1*   No results for input(s): LIPASE, AMYLASE in the last 8760 hours. No results for input(s): AMMONIA in the last 8760 hours. CBC:  Recent Labs  07/17/15 2200 07/19/15 0714 07/20/15 0526  WBC 9.7 11.0* 13.5*  NEUTROABS 6.3  --   --   HGB 13.0 12.1* 13.0  HCT 41.1 38.1* 40.2  MCV 91.5 91.4 91.6  PLT 188 173 186   Cardiac Enzymes: No results for input(s): CKTOTAL, CKMB, CKMBINDEX, TROPONINI in the last 8760 hours. BNP: Invalid input(s): POCBNP CBG:  Recent Labs  07/20/15 2134 07/21/15 0805 07/21/15 1204  GLUCAP 165* 46* 146*    Radiological Exams: Dg Chest 2 View  07/20/2015  CLINICAL DATA:  Wheezing. Pt is currently in the hospital for an DM related right foot infection. Hx of DM, HTN, cancer, CHF, CAD, A-fib EXAM: CHEST - 2 VIEW COMPARISON:  07/17/2015 FINDINGS: Stable linear scarring or subsegmental atelectasis at the left lung base. Right lung clear. Heart size upper limits normal. Atheromatous aorta. Previous CABG. Stable right subclavian  transvenous pacemaker. No effusion. IMPRESSION: 1. Stable chronic and postoperative changes.  No acute disease. Electronically Signed   By: Lucrezia Europe M.D.   On: 07/20/2015 08:44   Dg Chest Port 1 View  07/17/2015  CLINICAL DATA:  Cough and congestion for 1 week. EXAM: PORTABLE CHEST 1 VIEW COMPARISON:  02/16/2011 FINDINGS: Intact appearances of the transvenous cardiac leads. The lungs are clear. The pulmonary vasculature is normal. There is no large effusion. Hilar and mediastinal contours are unremarkable and unchanged. IMPRESSION: No acute cardiopulmonary findings. Electronically Signed   By: Andreas Newport M.D.   On: 07/17/2015 22:55    Assessment/Plan  Generalized weakness Will have him work with physical therapy and occupational therapy team to help with gait training and muscle strengthening exercises.fall precautions. Skin care. Encourage to be out of bed.   Fall initial encounter No apparent injury post mechanical fall. Calling for help and slow position change encouraged. Will have patient work with PT/OT as tolerated to regain strength and restore function.  Fall precautions are in place.  Hyponatremia Monitor bmp, aao x3 at present  Leukocytosis Currently on antibiotics for cellulitis, monitor wbc and temp curve  RLE cellulitis Continue and complete his doxycycline on 07/26/15. Continue wound care  RLE wound To wear his unna boot and continue wound care  PVD Continue skin care and monitor for signs of infection. To wear unna boots  Diastolic CHF Has basilar crackles. Add incentive spirometer. Continue metoprolol succinate 75 mg daily. Currently on lasix 60 mg daily as needed, change this to 40 mg daily for now and check weight and bmp. To give additional 20 mg daily if needed. Add kcl 20 meq daily.  Chronic cough Off ACEI for now. Add protonix 20 mg daily and reassess. CXR negative in hospital  ckd stage 3 Monitor bmp and weight  Protein calorie malnutrition RD to  evaluate, encouraged po intake  afib Rate controlled. Continue metoprolol succinate er 75 mg daily with coumadin with goal inr 2-3. Continue digoxin  Diabetes mellitus type 2 No results found for: HGBA1C Check a1c. Continue levemir and byetta, monitor cbg  CAD Chest pain free. Continue b  blocker and statin with prn NTG  Hyperlipidemia continue Lipitor 20 mg  daily  Hypertension Monitor bp, continue metoprolol and clonidine  Right ear skin neoplasm To get dermatology consult    Goals of care: short term rehabilitation   Labs/tests ordered: cmp 07/28/15  Family/ staff Communication: reviewed care plan with patient and nursing supervisor    Blanchie Serve, MD Internal Medicine [place], [place] [address] Cell Phone (Monday-Friday 8 am - 5 pm): [phone] On Call: [phone] and follow prompts after 5 pm and on weekends Office Phone: [phone] Office Fax: [phone]

## 2015-07-24 NOTE — Telephone Encounter (Signed)
Patient Information    Patient Name Sex DOB SSN   Conrado, Krotzer Male 11/16/1924 [id number]    Discharge Summaries by Caren Griffins, MD at 07/21/2015 9:48 AM    Author: Caren Griffins, MD Service: Internal Medicine Author Type: Physician   Filed: 07/21/2015 10:03 AM Note Time: 07/21/2015 9:48 AM Status: Addendum   Editor: Caren Griffins, MD (Physician)     Related Notes: Original Note by Caren Griffins, MD (Physician) filed at 07/21/2015 9:59 AM   Expand All Collapse All     Physician Discharge Summary  Ascencion Dike [id number] DOB: [birth year] DOA: 07/17/2015  PCP: Horatio Pel, MD  Admit date: 07/17/2015 Discharge date: 07/21/2015  Admitted From: home Disposition: SNF  Recommendations for Outpatient Follow-up:  1. Follow up with PCP in 1-2 weeks 2. Please obtain BMP in 4 days 3. Lasix was changed to daily PRN, please monitor fluid status with scheduled weights 4. ACEI discontinued on d/c 5. Wound center appointment in 4 days for [employer] management 6. Discharged on Doxycycline for 5 additional days to end on 7/8

## 2015-07-25 ENCOUNTER — Encounter (HOSPITAL_COMMUNITY): Payer: Medicare Other | Attending: Internal Medicine

## 2015-07-25 ENCOUNTER — Encounter (HOSPITAL_COMMUNITY): Payer: Self-pay

## 2015-07-25 ENCOUNTER — Other Ambulatory Visit: Payer: Self-pay | Admitting: *Deleted

## 2015-07-25 ENCOUNTER — Encounter (HOSPITAL_BASED_OUTPATIENT_CLINIC_OR_DEPARTMENT_OTHER): Payer: Medicare Other | Attending: Internal Medicine

## 2015-07-25 DIAGNOSIS — I2510 Atherosclerotic heart disease of native coronary artery without angina pectoris: Secondary | ICD-10-CM | POA: Diagnosis not present

## 2015-07-25 DIAGNOSIS — I130 Hypertensive heart and chronic kidney disease with heart failure and stage 1 through stage 4 chronic kidney disease, or unspecified chronic kidney disease: Secondary | ICD-10-CM | POA: Insufficient documentation

## 2015-07-25 DIAGNOSIS — Z950 Presence of cardiac pacemaker: Secondary | ICD-10-CM | POA: Diagnosis not present

## 2015-07-25 DIAGNOSIS — I87331 Chronic venous hypertension (idiopathic) with ulcer and inflammation of right lower extremity: Secondary | ICD-10-CM | POA: Diagnosis present

## 2015-07-25 DIAGNOSIS — Z794 Long term (current) use of insulin: Secondary | ICD-10-CM | POA: Insufficient documentation

## 2015-07-25 DIAGNOSIS — I739 Peripheral vascular disease, unspecified: Secondary | ICD-10-CM | POA: Insufficient documentation

## 2015-07-25 DIAGNOSIS — Z9221 Personal history of antineoplastic chemotherapy: Secondary | ICD-10-CM | POA: Insufficient documentation

## 2015-07-25 DIAGNOSIS — L97811 Non-pressure chronic ulcer of other part of right lower leg limited to breakdown of skin: Secondary | ICD-10-CM | POA: Insufficient documentation

## 2015-07-25 DIAGNOSIS — I509 Heart failure, unspecified: Secondary | ICD-10-CM | POA: Diagnosis not present

## 2015-07-25 DIAGNOSIS — Z951 Presence of aortocoronary bypass graft: Secondary | ICD-10-CM | POA: Diagnosis not present

## 2015-07-25 DIAGNOSIS — I4891 Unspecified atrial fibrillation: Secondary | ICD-10-CM | POA: Insufficient documentation

## 2015-07-25 DIAGNOSIS — E11622 Type 2 diabetes mellitus with other skin ulcer: Secondary | ICD-10-CM | POA: Insufficient documentation

## 2015-07-25 DIAGNOSIS — E1122 Type 2 diabetes mellitus with diabetic chronic kidney disease: Secondary | ICD-10-CM | POA: Insufficient documentation

## 2015-07-25 DIAGNOSIS — N183 Chronic kidney disease, stage 3 (moderate): Secondary | ICD-10-CM | POA: Diagnosis not present

## 2015-07-25 MED ORDER — FUROSEMIDE 40 MG PO TABS: 40.0000 mg | ORAL_TABLET | Freq: Every day | ORAL | Status: DC | PRN

## 2015-07-25 NOTE — Telephone Encounter (Signed)
Ok to fill daily PRN. Please advise pt he needs to keep track of how much Lasix he is taking a month.

## 2015-07-28 ENCOUNTER — Encounter (HOSPITAL_COMMUNITY): Payer: Self-pay

## 2015-07-28 ENCOUNTER — Encounter (HOSPITAL_COMMUNITY): Payer: Medicare Other

## 2015-07-28 LAB — BASIC METABOLIC PANEL
BUN: 42 mg/dL — AB (ref 4–21)
CREATININE: 1.4 mg/dL — AB (ref 0.6–1.3)
GLUCOSE: 69 mg/dL
POTASSIUM: 4.8 mmol/L (ref 3.4–5.3)
SODIUM: 139 mmol/L (ref 137–147)

## 2015-07-28 LAB — HEPATIC FUNCTION PANEL
ALK PHOS: 173 U/L — AB (ref 25–125)
ALT: 27 U/L (ref 10–40)
AST: 46 U/L — AB (ref 14–40)
BILIRUBIN, TOTAL: 1 mg/dL

## 2015-07-30 ENCOUNTER — Encounter (HOSPITAL_COMMUNITY): Payer: Medicare Other

## 2015-07-30 ENCOUNTER — Encounter (HOSPITAL_COMMUNITY): Payer: Self-pay

## 2015-08-01 ENCOUNTER — Encounter (HOSPITAL_COMMUNITY): Payer: Self-pay

## 2015-08-01 ENCOUNTER — Encounter (HOSPITAL_COMMUNITY): Payer: Medicare Other

## 2015-08-01 DIAGNOSIS — I87331 Chronic venous hypertension (idiopathic) with ulcer and inflammation of right lower extremity: Secondary | ICD-10-CM | POA: Diagnosis not present

## 2015-08-04 ENCOUNTER — Encounter (HOSPITAL_COMMUNITY): Payer: Medicare Other

## 2015-08-04 ENCOUNTER — Encounter (HOSPITAL_COMMUNITY): Payer: Self-pay

## 2015-08-06 ENCOUNTER — Encounter (HOSPITAL_COMMUNITY): Payer: Medicare Other

## 2015-08-06 ENCOUNTER — Encounter (HOSPITAL_COMMUNITY): Payer: Self-pay

## 2015-08-06 ENCOUNTER — Encounter: Payer: Self-pay | Admitting: Adult Health

## 2015-08-06 ENCOUNTER — Non-Acute Institutional Stay (SKILLED_NURSING_FACILITY): Payer: Medicare Other | Admitting: Adult Health

## 2015-08-06 DIAGNOSIS — S81801S Unspecified open wound, right lower leg, sequela: Secondary | ICD-10-CM

## 2015-08-06 DIAGNOSIS — I2510 Atherosclerotic heart disease of native coronary artery without angina pectoris: Secondary | ICD-10-CM | POA: Diagnosis not present

## 2015-08-06 DIAGNOSIS — I5032 Chronic diastolic (congestive) heart failure: Secondary | ICD-10-CM | POA: Diagnosis not present

## 2015-08-06 DIAGNOSIS — D485 Neoplasm of uncertain behavior of skin: Secondary | ICD-10-CM | POA: Diagnosis not present

## 2015-08-06 DIAGNOSIS — I4891 Unspecified atrial fibrillation: Secondary | ICD-10-CM | POA: Diagnosis not present

## 2015-08-06 DIAGNOSIS — N183 Chronic kidney disease, stage 3 unspecified: Secondary | ICD-10-CM

## 2015-08-06 DIAGNOSIS — I10 Essential (primary) hypertension: Secondary | ICD-10-CM | POA: Diagnosis not present

## 2015-08-06 DIAGNOSIS — E1122 Type 2 diabetes mellitus with diabetic chronic kidney disease: Secondary | ICD-10-CM | POA: Diagnosis not present

## 2015-08-06 DIAGNOSIS — E46 Unspecified protein-calorie malnutrition: Secondary | ICD-10-CM | POA: Diagnosis not present

## 2015-08-06 DIAGNOSIS — E785 Hyperlipidemia, unspecified: Secondary | ICD-10-CM

## 2015-08-06 DIAGNOSIS — R5381 Other malaise: Secondary | ICD-10-CM | POA: Diagnosis not present

## 2015-08-06 DIAGNOSIS — Z794 Long term (current) use of insulin: Secondary | ICD-10-CM

## 2015-08-06 DIAGNOSIS — D492 Neoplasm of unspecified behavior of bone, soft tissue, and skin: Secondary | ICD-10-CM

## 2015-08-06 NOTE — Progress Notes (Signed)
Patient ID: LAGREGORY HORI, male   DOB: Jun 13, 1924, 82 y.o.   MRN: [id number]    DATE:  08/06/2015   MRN:  [id number]  BIRTHDAY: [birth year]  Facility:  Nursing Home Location:  [place] Room Number: [id number]  LEVEL OF CARE:  SNF [phone])  Contact Information    Name [facility] Work Mobile   Crump,Karen Daughter [phone]   Nickan, Theodorou Daughter [phone]       Code Status History    Date Active Date Inactive Code Status Order ID Comments User Context   07/18/2015 12:02 AM 07/21/2015  8:30 PM Full Code [id number]  Etta Quill, DO ED       Chief Complaint  Patient presents with  . Discharge Note    HISTORY OF PRESENT ILLNESS:  This is an 82 year old male who is for discharge home with Home health OT, PT and CNA. DME:  Rolling walker and 3-in-1 commode.  He has been admitted to [facility] on 07/21/15 from [facility]. She has PMH of diabetes mellitus, diastolic CHF and venous stasis ulcers of legs. He was treated in the hospital from 07/19/15 to 07/21/15. He was treated for RLE cellulitis. He was started on broad spectrum antibiotic, wound RN consulted and Haematologist. He was discharged on doxycycline for 5 additional days and has an appointment with the wound clinic. ACEI was discontinued due to chronic cough with chest x-ray being negative.  Patient was admitted to this facility for short-term rehabilitation after the patient's recent hospitalization.  Patient has completed SNF rehabilitation and therapy has cleared the patient for discharge.   PAST MEDICAL HISTORY:  Past Medical History  Diagnosis Date  . A-fib ([place])   . Hypertension   . CAD (coronary artery disease)   . Diabetes mellitus type 2 in nonobese (HCC)   . CHF (congestive heart failure) ([place])   . Cancer [facility])     Colon  . Generalized weakness   . Fall   . Hyponatremia   . Leukocytosis   . Cellulitis of right  lower extremity   . PVD (peripheral vascular disease) ([place])   . Chronic diastolic CHF (congestive heart failure) ([place])   . Chronic cough   . CKD (chronic kidney disease), stage III   . Protein calorie malnutrition ([place])   . HLD (hyperlipidemia)      CURRENT MEDICATIONS: Reviewed  Patient's Medications  New Prescriptions   No medications on file  Previous Medications   ACETAMINOPHEN (TYLENOL) 500 MG CHEWABLE TABLET    Chew 500 mg by mouth every 6 (six) hours as needed. For pain   ATORVASTATIN (LIPITOR) 20 MG TABLET    Take 20 mg by mouth daily.   CALCIUM CARBONATE ANTACID (MAALOX) 600 MG CHEWABLE TABLET    Chew 600 mg by mouth daily as needed.    CLONIDINE (CATAPRES) 0.2 MG TABLET    Take 0.4 mg by mouth at bedtime. Give 2 tabs to = 0.4 mg QHS, give 0.2 mg po q6h prn for SBP >160   CYANOCOBALAMIN 100 MCG TABLET    Take 100 mcg by mouth daily.   DIGOXIN (LANOXIN) 0.125 MG TABLET    Take 0.125 mg by mouth daily.   EXENATIDE (BYETTA 5 MCG PEN [place])    Inject 5 mcg into the skin daily.    FUROSEMIDE (LASIX) 20 MG TABLET    Take 20 mg by mouth daily as needed for fluid or edema (Dyspnea,  increased edema, weight gain of >3 lbs over one day).   FUROSEMIDE (LASIX) 40 MG TABLET    Take 1 tablet (40 mg total) by mouth daily as needed for fluid or edema.   INSULIN DETEMIR (LEVEMIR) 100 UNIT/ML INJECTION    Inject 0.08 mLs (8 Units total) into the skin at bedtime.   METOPROLOL SUCCINATE (TOPROL-XL) 50 MG 24 HR TABLET    Take 75 mg by mouth daily. Take with or immediately following a meal.   NITROGLYCERIN (NITROSTAT) 0.4 MG SL TABLET    Place 0.4 mg under the tongue every 5 (five) minutes as needed.     POTASSIUM CHLORIDE SA (K-DUR,KLOR-CON) 20 MEQ TABLET    Take 20 mEq by mouth daily.   WARFARIN (COUMADIN) 5 MG TABLET    Take 2.5-5 mg by mouth See admin instructions. Take 5mg  every day except on Mon, Wed, Fri and Saturday take 2.5mg   Modified Medications   No medications on file  Discontinued  Medications   CLONIDINE (CATAPRES) 0.2 MG TABLET    TAKE 2 TABLETS BY MOUTH THREE TIMES DAILY   DOXYCYCLINE (VIBRAMYCIN) 100 MG CAPSULE    Take 1 capsule (100 mg total) by mouth 2 (two) times daily.     Allergies  Allergen Reactions  . Penicillins Swelling    Has patient had a PCN reaction causing immediate rash, facial/tongue/throat swelling, SOB or lightheadedness with hypotension: NO Has patient had a PCN reaction causing severe rash involving mucus membranes or skin necrosis: NO Has patient had a PCN reaction that required hospitalization NO Has patient had a PCN reaction occurring within the last 10 years: NO If all of the above answers are "NO", then may proceed with Cephalosporin use.     REVIEW OF SYSTEMS:  GENERAL: no change in appetite, no fatigue, no weight changes, no fever, chills or weakness EYES: Denies change in vision, dry eyes, eye pain, itching or discharge EARS: Denies change in hearing, ringing in ears, or earache NOSE: Denies nasal congestion or epistaxis MOUTH and THROAT: Denies oral discomfort, gingival pain or bleeding, pain from teeth or hoarseness   RESPIRATORY: no cough, SOB, DOE, wheezing, hemoptysis CARDIAC: no chest pain, edema or palpitations GI: no abdominal pain, diarrhea, constipation, heart burn, nausea or vomiting GU: Denies dysuria, frequency, hematuria, incontinence, or discharge PSYCHIATRIC: Denies feeling of depression or anxiety. No report of hallucinations, insomnia, paranoia, or agitation   PHYSICAL EXAMINATION  GENERAL APPEARANCE: Well nourished. In no acute distress. Normal body habitus SKIN:  Right ear lesion; RLE has unna boot due to wound HEAD: Normal in size and contour. No evidence of trauma EYES: Lids open and close normally. No blepharitis, entropion or ectropion. PERRL. Conjunctivae are clear and sclerae are white. Lenses are without opacity EARS: Pinnae are normal. Patient hears normal voice tunes of the examiner MOUTH and  THROAT: Lips are without lesions. Oral mucosa is moist and without lesions. Tongue is normal in shape, size, and color and without lesions NECK: supple, trachea midline, no neck masses, no thyroid tenderness, no thyromegaly LYMPHATICS: no LAN in the neck, no supraclavicular LAN RESPIRATORY: breathing is even & unlabored, BS CTAB CARDIAC: RRR, no murmur,no extra heart sounds, BLE 1+ edema, has pacemaker on right chest GI: abdomen soft, normal BS, no masses, no tenderness, no hepatomegaly, no splenomegaly EXTREMITIES:  Able to move X 4 extremities PSYCHIATRIC: Alert and oriented X 3. Affect and behavior are appropriate  LABS/RADIOLOGY: Labs reviewed: Basic Metabolic Panel:  Recent Labs  07/19/15 0714 07/20/15  [id number] 07/21/15 0530 07/24/15 07/28/15  NA 137 136 133* 138 139  K 3.6 3.6 3.8 4.4 4.8  CL 100* 101 100*  --   --   CO2 28 26 25   --   --   GLUCOSE 63* 55* 64*  --   --   BUN 35* 44* 50* 55* 42*  CREATININE 1.66* 1.71* 1.78* 1.7* 1.4*  CALCIUM 8.3* 8.4* 8.4*  --   --    Liver Function Tests:  Recent Labs  07/17/15 2200 07/28/15  AST 38 46*  ALT 21 27  ALKPHOS 148* 173*  BILITOT 0.8  --   PROT 7.4  --   ALBUMIN 3.1*  --    CBC:  Recent Labs  07/17/15 2200 07/19/15 0714 07/20/15 0526 07/24/15  WBC 9.7 11.0* 13.5* 8.2  NEUTROABS 6.3  --   --  5  HGB 13.0 12.1* 13.0 13.0*  HCT 41.1 38.1* 40.2 40*  MCV 91.5 91.4 91.6  --   PLT 188 173 186 258   CBG:  Recent Labs  07/20/15 2134 07/21/15 0805 07/21/15 1204  GLUCAP 165* 46* 146*     Dg Chest 2 View  07/20/2015  CLINICAL DATA:  Wheezing. Pt is currently in the hospital for an DM related right foot infection. Hx of DM, HTN, cancer, CHF, CAD, A-fib EXAM: CHEST - 2 VIEW COMPARISON:  07/17/2015 FINDINGS: Stable linear scarring or subsegmental atelectasis at the left lung base. Right lung clear. Heart size upper limits normal. Atheromatous aorta. Previous CABG. Stable right subclavian transvenous pacemaker. No  effusion. IMPRESSION: 1. Stable chronic and postoperative changes.  No acute disease. Electronically Signed   By: Lucrezia Europe M.D.   On: 07/20/2015 08:44   Dg Chest Port 1 View  07/17/2015  CLINICAL DATA:  Cough and congestion for 1 week. EXAM: PORTABLE CHEST 1 VIEW COMPARISON:  02/16/2011 FINDINGS: Intact appearances of the transvenous cardiac leads. The lungs are clear. The pulmonary vasculature is normal. There is no large effusion. Hilar and mediastinal contours are unremarkable and unchanged. IMPRESSION: No acute cardiopulmonary findings. Electronically Signed   By: Andreas Newport M.D.   On: 07/17/2015 22:55    ASSESSMENT/PLAN:  Physical deconditioning - for Home health CNA, PT and OT  RLE wound - recently finished antibiotic course; follow-up with wound clinic  Diabetes mellitus, type II  - continue  Byetta 5 mcg SQ daily and Levemir 100 units/ml inject 8 units SQ Q HS Lab Results  Component Value Date   HGBA1C 7.2 07/24/2015    Atrial fibrillation  - rate controlled; continue digitoxin 5 mg 1 tab Q Sun-T-Th and Coumadin 2.5 mg by mouth every Monday, Wednesday, Friday and Saturday; for INR check on 08/12/15  Hyperlipidemia  - continue Lipitor 20 mg 1 tab by mouth daily Lab Results  Component Value Date   CHOL 83 07/24/2015   HDL 33* 07/24/2015   LDLCALC 39 07/24/2015   TRIG 57 07/24/2015    Chronic kidney disease, stage III - stable  Lab Results  Component Value Date   CREATININE 1.4* 07/28/2015    Protein calorie malnutrition - albumin 3.1; referred to RD  Hypertension - continue metoprolol succinate ER 50 mg take 1 1/2 tab = 75 mg by mouth daily, clonidine 0.2 mg take 2 tabs = 0.4 mg PO Q HS and Clonidine 0.2 mg 1 tab PO Q 6 PRN for SBP >160  Hypokalemia - recently started on KCL ER 1 tab PO Q D; decrease KCL ER to  10 meq 1 tab PO daily; BMP in 1 week Lab Results  Component Value Date   K 4.8 07/28/2015   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  Patient will receive home health PT, OT, ST, Nursing and CNA.  DME provided:  Rolling walker and 3-in-1 commode  Total discharge time: Greater than 30 minutes  Discharge time involved coordination of the discharge process with Education officer, museum, nursing staff and therapy department. Medical justification for home health services/DME verified.     Durenda Age, NP [employer] [phone]

## 2015-08-07 DIAGNOSIS — I87331 Chronic venous hypertension (idiopathic) with ulcer and inflammation of right lower extremity: Secondary | ICD-10-CM | POA: Diagnosis not present

## 2015-08-08 ENCOUNTER — Encounter (HOSPITAL_COMMUNITY): Payer: Self-pay

## 2015-08-08 ENCOUNTER — Encounter (HOSPITAL_COMMUNITY): Payer: Medicare Other

## 2015-08-11 ENCOUNTER — Encounter (HOSPITAL_COMMUNITY): Payer: Self-pay

## 2015-08-11 ENCOUNTER — Encounter (HOSPITAL_COMMUNITY): Payer: Medicare Other

## 2015-08-13 ENCOUNTER — Encounter (HOSPITAL_COMMUNITY): Payer: Self-pay

## 2015-08-13 ENCOUNTER — Encounter (HOSPITAL_COMMUNITY): Payer: Medicare Other

## 2015-08-14 ENCOUNTER — Ambulatory Visit (INDEPENDENT_AMBULATORY_CARE_PROVIDER_SITE_OTHER): Payer: Medicare Other | Admitting: *Deleted

## 2015-08-14 DIAGNOSIS — I4891 Unspecified atrial fibrillation: Secondary | ICD-10-CM

## 2015-08-14 DIAGNOSIS — Z7901 Long term (current) use of anticoagulants: Secondary | ICD-10-CM

## 2015-08-14 DIAGNOSIS — Z5181 Encounter for therapeutic drug level monitoring: Secondary | ICD-10-CM

## 2015-08-14 DIAGNOSIS — I87331 Chronic venous hypertension (idiopathic) with ulcer and inflammation of right lower extremity: Secondary | ICD-10-CM | POA: Diagnosis not present

## 2015-08-14 LAB — POCT INR: INR: 2.2

## 2015-08-15 ENCOUNTER — Encounter (HOSPITAL_COMMUNITY): Payer: Medicare Other

## 2015-08-15 ENCOUNTER — Encounter (HOSPITAL_COMMUNITY): Payer: Self-pay

## 2015-08-18 ENCOUNTER — Encounter (HOSPITAL_COMMUNITY): Payer: Self-pay

## 2015-08-18 ENCOUNTER — Encounter: Payer: Medicare Other | Admitting: Physician Assistant

## 2015-08-18 ENCOUNTER — Encounter (HOSPITAL_COMMUNITY): Payer: Medicare Other

## 2015-08-19 ENCOUNTER — Encounter: Payer: Medicare Other | Admitting: Physician Assistant

## 2015-08-20 ENCOUNTER — Encounter (HOSPITAL_COMMUNITY): Payer: Self-pay

## 2015-08-21 ENCOUNTER — Ambulatory Visit (INDEPENDENT_AMBULATORY_CARE_PROVIDER_SITE_OTHER): Payer: Medicare Other | Admitting: Physician Assistant

## 2015-08-21 ENCOUNTER — Encounter: Payer: Self-pay | Admitting: Physician Assistant

## 2015-08-21 VITALS — BP 164/68 | HR 60 | Ht 68.0 in | Wt 184.0 lb

## 2015-08-21 DIAGNOSIS — I482 Chronic atrial fibrillation: Secondary | ICD-10-CM | POA: Diagnosis not present

## 2015-08-21 DIAGNOSIS — I2510 Atherosclerotic heart disease of native coronary artery without angina pectoris: Secondary | ICD-10-CM

## 2015-08-21 DIAGNOSIS — I10 Essential (primary) hypertension: Secondary | ICD-10-CM | POA: Diagnosis not present

## 2015-08-21 DIAGNOSIS — Z5181 Encounter for therapeutic drug level monitoring: Secondary | ICD-10-CM

## 2015-08-21 DIAGNOSIS — I4821 Permanent atrial fibrillation: Secondary | ICD-10-CM

## 2015-08-21 DIAGNOSIS — R001 Bradycardia, unspecified: Secondary | ICD-10-CM | POA: Diagnosis not present

## 2015-08-21 NOTE — Patient Instructions (Addendum)
Medication Instructions:   Your physician recommends that you continue on your current medications as directed. Please refer to the Current Medication list given to you today.  If you need a refill on your cardiac medications before your next appointment, please call your pharmacy.  Labwork:  Digoxin    Testing/Procedures: NONE ORDER TODAY    Follow-Up:  Your physician wants you to follow-up in:  IN  [place] will receive a reminder letter in the mail two months in advance. If you don't receive a letter, please call our office to schedule the follow-up appointment.   Remote monitoring is used to monitor your Pacemaker of ICD from home. This monitoring reduces the number of office visits required to check your device to one time per year. It allows [place] to keep an eye on the functioning of your device to ensure it is working properly. You are scheduled for a device check from home on .  11/3/2017You may send your transmission at any time that day. If you have a wireless device, the transmission will be sent automatically. After your physician reviews your transmission, you will receive a postcard with your next transmission date.    Any Other Special Instructions Will Be Listed Below (If Applicable).

## 2015-08-21 NOTE — Progress Notes (Signed)
Cardiology Office Note Date:  08/21/2015  Patient ID:  Oscar Bruce, Or [birth year], MRN [id number] PCP:  Horatio Pel, MD  Electrophysiologist: Dr. Lovena Le   Chief Complaint: routine PPM visit   History of Present Illness: Oscar Bruce is a 82 y.o. male with history of a recent hospital stay secondary to recurrent RLE cellulitis, vesous stasis ulcer, skinlesion suspect to be skin ca ear, was discharged to [facility] 07/21/15.  He also has PMHx of AFib, CAD/CABG remotely, colon cancer, CRI stage III, chronic diastolic CHF, HTN, DM, and symptomatic bradycardia w/PPM.   He comes today to be seen for Dr. Lovena Le for a routine in-clinic pacer check.  The last time he saw him was may 2016, at that time doing well from EP/pacer standpoint.  He is accompanied by his daughter who he is living with now, after his stay at rehab/SNF after his last hospital stay.  He is oing to be going to [place] for a couple months soon to stay with his other daughter for a while.  He is not quite back to his usual activity level after his hospital stay, fatigues easily still but c/w therapy at home.  He denies any kind of CP, no rest SOB, no symptoms of orthopnea or PND, no overt SOB with casual activity but gets a little winded with increased activities.  No near syncope or syncope, rare palpitations.  He denies any bleeding or signs of bleeding, no falls.  Device information: SJM, single chamber PPM, gen change 01/07/10, original implant 08/22/02 (dual chamber device) PACER DEPENDENT   Past Medical History:  Diagnosis Date  . A-fib ([place])   . CAD (coronary artery disease)   . Cancer [facility])    Colon  . Cellulitis of right lower extremity   . CHF (congestive heart failure) ([place])   . Chronic cough   . Chronic diastolic CHF (congestive heart failure) ([place])   . CKD (chronic kidney disease), stage III   . Diabetes mellitus type 2 in nonobese (HCC)   . Fall   . Generalized weakness   . HLD (hyperlipidemia)   .  Hypertension   . Hyponatremia   . Leukocytosis   . Protein calorie malnutrition ([place])   . PVD (peripheral vascular disease) ([place])     Past Surgical History:  Procedure Laterality Date  . CARDIAC CATHETERIZATION  2001, 2004  . CATARACT EXTRACTION  2000  . CHOLECYSTECTOMY    . CORONARY ARTERY BYPASS GRAFT    . PACEMAKER INSERTION      Current Outpatient Prescriptions  Medication Sig Dispense Refill  . acetaminophen (TYLENOL) 500 MG chewable tablet Chew 500 mg by mouth every 6 (six) hours as needed. For pain    . atorvastatin (LIPITOR) 20 MG tablet Take 20 mg by mouth daily.    . Calcium Carbonate Antacid (MAALOX) 600 MG chewable tablet Chew 600 mg by mouth daily as needed.     . cloNIDine (CATAPRES) 0.2 MG tablet Take 0.4 mg by mouth at bedtime. Give 2 tabs to = 0.4 mg QHS, give 0.2 mg po q6h prn for SBP >160    . cyanocobalamin 100 MCG tablet Take 100 mcg by mouth daily.    . digoxin (LANOXIN) 0.125 MG tablet Take 0.125 mg by mouth daily.    . Exenatide (BYETTA 5 MCG PEN ) Inject 5 mcg into the skin daily.     . furosemide (LASIX) 20 MG tablet Take 20 mg by mouth daily as needed for fluid or edema (  Dyspnea, increased edema, weight gain of >3 lbs over one day).    . furosemide (LASIX) 40 MG tablet Take 1 tablet (40 mg total) by mouth daily as needed for fluid or edema. 15 tablet 3  . insulin detemir (LEVEMIR) 100 UNIT/ML injection Inject 0.08 mLs (8 Units total) into the skin at bedtime. 10 mL 11  . metoprolol succinate (TOPROL-XL) 50 MG 24 hr tablet Take 75 mg by mouth daily. Take with or immediately following a meal.    . nitroGLYCERIN (NITROSTAT) 0.4 MG SL tablet Place 0.4 mg under the tongue every 5 (five) minutes as needed.      . potassium chloride SA (K-DUR,KLOR-CON) 20 MEQ tablet Take 20 mEq by mouth daily.    Marland Kitchen warfarin (COUMADIN) 5 MG tablet Take 2.5-5 mg by mouth See admin instructions. Take 5mg  every day except on Mon, Wed, Fri and Saturday take 2.5mg      No current  facility-administered medications for this visit.     Allergies:   Penicillins   Social History:  The patient  reports that he has never smoked. He does not have any smokeless tobacco history on file. He reports that he does not drink alcohol or use drugs.   Family History:  The patient's family history includes Coronary artery disease in his father; Diabetes in his mother and sister.  ROS:  Please see the history of present illness.    All other systems are reviewed and otherwise negative.   PHYSICAL EXAM:  VS:  BP (!) 164/68   Pulse 60   Ht 5\' 8"  (1.727 m)   Wt 184 lb (83.5 kg)   BMI 27.98 kg/m  BMI: Body mass index is 27.98 kg/m. Well nourished, well developed, in no acute distress, appears his age, observed to ambulate well with his walker HEENT: normocephalic, atraumatic  Neck: no JVD, carotid bruits or masses Cardiac:  (paced) RRR; no significant murmurs, no rubs, or gallops Lungs:  clear to auscultation bilaterally, no wheezing, rhonchi or rales  Abd: soft, nontender MS: no deformity or atrophy Ext: He has a compression stocking on LLE, una boot to RLE Skin: warm and dry, no rash, he has some superficial skin lesions face Neuro:  No gross deficits appreciated Psych: euthymic mood, full affect  PPM site is stable, no tethering or discomfort, he has a small superficial skin wound inferior to his pacer, dry, no erythema, no increased heat to the tissues.    EKG:  Done today and reviewed by myself shows V paced rhythm PPM interrogation today: normal device function, battery status is good, he is pacer dependent, hysteresis is programmed off, V autocapture management is programmed on  04/20/12: Echocardiogram Study Conclusions - Left ventricle: The cavity size was normal. Wall thickness was increased in a pattern of mild LVH. Systolic function was mildly reduced. The estimated ejection fraction was in the range of 45% to 50%. Diffuse hypokinesis. Probably moderate  diastolic dysfunction. - Aortic valve: There was no stenosis. Mild regurgitation. - Mitral valve: Mildly calcified annulus. Mild regurgitation. - Left atrium: The atrium was moderately dilated. - Right ventricle: The cavity size was mildly dilated. Pacer wire or catheter noted in right ventricle. Systolic function was mildly to moderately reduced. - Right atrium: The atrium was mildly to moderately dilated. - Tricuspid valve: Moderate regurgitation. Peak RV-RA gradient: 60mm Hg (S). - Pulmonary arteries: PA peak pressure: 63mm Hg (S). - Systemic veins: IVC measured 2.0 cm with some respirophasic variation, suggesting RA pressure 10 mmHg. -  Pericardium, extracardiac: A trivial pericardial effusion was identified. Impressions: - Normal LV size with mild LV hypertrophy. EF 45-50% with mild diffuse hypokinesis. Probably moderate diastolic dysfunction. Biatrial enlargement. Mildly dilated RV with mild to moderate systolic dysfunction. Moderate pulmonary hypertension.  Recent Labs: 07/17/2015: B Natriuretic Peptide 617.6 07/24/2015: Hemoglobin 13.0; Platelets 258 07/28/2015: ALT 27; BUN 42; Creatinine 1.4; Potassium 4.8; Sodium 139  07/24/2015: Cholesterol 83; HDL 33; LDL Cholesterol 39; Triglycerides 57   CrCl cannot be calculated (Patient's most recent lab result is older than the maximum 21 days allowed.).   Wt Readings from Last 3 Encounters:  08/21/15 184 lb (83.5 kg)  08/06/15 163 lb (73.9 kg)  07/24/15 163 lb (73.9 kg)     Other studies reviewed: Additional studies/records reviewed today include: summarized above  ASSESSMENT AND PLAN:  1. Symptomatic bradycardia w/PPM      normal device function      Pacer dependent  2. Perrmanent AFib    CHA2DS2Vasc is at least 3, on warfarin, monitored and managed at the coumadin clinic.  He has used a coumadin clinic in [place] in the past and will use the same clinic when he goes up again    On dig,     07/24/15 H/H  13/40  3. HTN     Stable, recheck on his BP 158/80, he reports it tends to wvaer up/down a little  4. CAD    no symptoms    On BB, statin, no ASA with warfarin  5. Venous stasis, recurrent issues with cellulitis LE     C/w his vascular specialist and wound care clinic  Disposition: Dig level todaty, 3 month remote, he reports he will be back by then, Dr. Lovena Le in 81months sooner if needed, He is instructed to monitor the skin wound on his chest, f/u with his PMD, and try not to scratch/pick at it.  Current medicines are reviewed at length with the patient today.  The patient did not have any concerns regarding medicines.  Haywood Lasso, PA-C 08/21/2015 4:54 PM     [address] [place] [address] [phone] (office)  [phone] (fax)

## 2015-08-22 ENCOUNTER — Encounter (HOSPITAL_COMMUNITY): Payer: Self-pay

## 2015-08-22 ENCOUNTER — Encounter: Payer: Medicare Other | Admitting: Physician Assistant

## 2015-08-22 LAB — DIGOXIN LEVEL: Digoxin Level: 0.7 ug/L — ABNORMAL LOW (ref 0.8–2.0)

## 2015-08-25 ENCOUNTER — Ambulatory Visit (INDEPENDENT_AMBULATORY_CARE_PROVIDER_SITE_OTHER): Payer: Medicare Other | Admitting: *Deleted

## 2015-08-25 ENCOUNTER — Encounter: Payer: Self-pay | Admitting: Internal Medicine

## 2015-08-25 ENCOUNTER — Encounter (HOSPITAL_COMMUNITY): Payer: Self-pay

## 2015-08-25 DIAGNOSIS — Z7901 Long term (current) use of anticoagulants: Secondary | ICD-10-CM | POA: Diagnosis not present

## 2015-08-25 DIAGNOSIS — Z5181 Encounter for therapeutic drug level monitoring: Secondary | ICD-10-CM | POA: Diagnosis not present

## 2015-08-25 DIAGNOSIS — I4891 Unspecified atrial fibrillation: Secondary | ICD-10-CM

## 2015-08-25 LAB — POCT INR: INR: 2.5

## 2015-08-26 ENCOUNTER — Other Ambulatory Visit: Payer: Self-pay | Admitting: Internal Medicine

## 2015-08-27 ENCOUNTER — Encounter (HOSPITAL_COMMUNITY): Payer: Self-pay

## 2015-08-27 ENCOUNTER — Telehealth: Payer: Self-pay | Admitting: Internal Medicine

## 2015-08-27 MED ORDER — LOSARTAN POTASSIUM 25 MG PO TABS: 25.0000 mg | ORAL_TABLET | Freq: Every day | ORAL | 6 refills | Status: AC

## 2015-08-27 NOTE — Telephone Encounter (Signed)
Oscar Bruce pt's daughter states that prior D/C from the hospital DR. Pharr pt's  PCP D/C his Ramipril 5 mg medication due to cough. Pt's BP goes up with activity. yesterday when up walking  with PT, his BP was [id number] systolic. Pt was seen in the clinic by PA-C on 08/21/15 his BP was up 164/68, when rechecked 158/80. Today's BP is 130/70. Daughter said that pt needs to be on a BP medication.  Pt's daughter is aware to call  the PCP office also regarding this issue, because pt's PCP D/C BP medication.  Pt's daughter called Dr. Shelia Media they recommends for her to take pt to an Urgent care so they can order a BP medication. Dr. Johnsie Cancel DOD is aware of pt's symptoms. He recommends Cozaar 25 mg once daily. Pt's daughter aware. Prescription is being send to [facility] pharmacy in [place] ph # [phone]

## 2015-08-27 NOTE — Telephone Encounter (Signed)
Left [place] Gave a message to call back. Note: According the hospital discharge note on 07/21/15 Pt's Ramipril 5 mg was D/C per Dr. Horatio Pel MD pt's PCP.

## 2015-08-27 NOTE — Telephone Encounter (Signed)
New message    Patient daughter calling    Pt C/O medication issue:  1. Name of Medication: ramipril    2. How are you currently taking this medication (dosage and times per day)? Daughter unsure ?   3. Are you having a reaction (difficulty breathing--STAT)? Was discontinue to recent hospital - cough   4. What is your medication issue? Blood pressure with physical therapy  120-130 range resting . Starting walking down side walking  175 .   Daughter concern he needs another blood pressure medication.

## 2015-08-27 NOTE — Telephone Encounter (Signed)
Follow up  Oscar Bruce returning nurses call.

## 2015-08-29 ENCOUNTER — Encounter (HOSPITAL_COMMUNITY): Payer: Self-pay

## 2015-09-01 ENCOUNTER — Encounter (HOSPITAL_COMMUNITY): Payer: Self-pay

## 2015-09-02 ENCOUNTER — Telehealth: Payer: Self-pay | Admitting: *Deleted

## 2015-09-02 NOTE — Telephone Encounter (Signed)
-----   Message from Evans Lance, MD sent at 08/31/2015  4:41 PM EDT ----- Normal LV function.

## 2015-09-02 NOTE — Telephone Encounter (Signed)
LMOVM TO CALL BACK FOR RESULTS 

## 2015-09-03 ENCOUNTER — Encounter (HOSPITAL_COMMUNITY): Payer: Self-pay

## 2015-09-05 ENCOUNTER — Encounter (HOSPITAL_COMMUNITY): Payer: Self-pay

## 2015-09-08 ENCOUNTER — Encounter (HOSPITAL_COMMUNITY): Payer: Self-pay

## 2015-09-10 ENCOUNTER — Encounter (HOSPITAL_COMMUNITY): Payer: Self-pay

## 2015-09-12 ENCOUNTER — Telehealth: Payer: Self-pay | Admitting: Internal Medicine

## 2015-09-12 ENCOUNTER — Encounter (HOSPITAL_COMMUNITY): Payer: Self-pay

## 2015-09-12 NOTE — Telephone Encounter (Signed)
Oscar Bruce, daughter that lives in [place], calling.  Pt authorizes [place] to speak with her.  Oscar Bruce states that he has been more SOB past 3 days.  He has had to sleep in recliner because he can't lie in bed.  She states that when was in Rehab his Ramipril was d/c due to a cough.  She states ever since then his SOB has seem to be getting worse. Losartan was started on 8/9.  His BP has been in range of 160/60's.  He is taking Lasix 40 mg daily.  States he is not weighing daily.  Ankles slightly swollen but not any worse than usual.  He is wearing support hose.  States he has been sedentary since he has been in [place] (8/11) due to the SOB.  States since he will be here till November she has scheduled him to see cardiologist there on 9/15.  She is trying to watch his sodium intake.  Spoke w/Dr. Lovena Le (DOD) who suggest that he increase Lasix to 40 mg BID x 3 days.  Advised daughter.  Also suggested that he weigh daily in AM, keep legs elevated and continue to use support hose.  Advised to call us next week if SOB has not improved. She verbalizes understanding and will call if no improvement.

## 2015-09-12 NOTE — Telephone Encounter (Signed)
New message    Pt c/o Shortness Of Breath: STAT if SOB developed within the last 24 hours or pt is noticeably SOB on the phone  1. Are you currently SOB (can you hear that pt is SOB on the phone)?  Pt not on phone  2. How long have you been experiencing SOB? Increasing worse since visiting daughter 3. Are you SOB when sitting or when up moving around? sitting  4. Are you currently experiencing any other symptoms? No  Pt is visiting daughter in [place].  Daughter said he is having more sob and cannot lie flat.  He is going to see a cardiologist there because he will not be home until November.  Daughter want to talk to a nurse to report what is happening with pt and get advise---since we know his health history.  Please call

## 2015-09-15 ENCOUNTER — Encounter (HOSPITAL_COMMUNITY): Payer: Self-pay

## 2015-09-17 ENCOUNTER — Encounter (HOSPITAL_COMMUNITY): Payer: Self-pay

## 2015-09-19 LAB — POCT INR: INR: 2.4

## 2015-09-20 ENCOUNTER — Other Ambulatory Visit: Payer: Self-pay | Admitting: Internal Medicine

## 2015-09-23 ENCOUNTER — Ambulatory Visit (INDEPENDENT_AMBULATORY_CARE_PROVIDER_SITE_OTHER): Payer: Medicare Other | Admitting: Cardiology

## 2015-09-23 DIAGNOSIS — Z5181 Encounter for therapeutic drug level monitoring: Secondary | ICD-10-CM

## 2015-09-23 NOTE — Telephone Encounter (Signed)
Please call the patient and ask how he is taking the medication presently

## 2015-10-01 ENCOUNTER — Other Ambulatory Visit: Payer: Self-pay | Admitting: *Deleted

## 2015-10-01 MED ORDER — FUROSEMIDE 40 MG PO TABS: 40.0000 mg | ORAL_TABLET | Freq: Every day | ORAL | 3 refills | Status: AC

## 2015-10-01 NOTE — Addendum Note (Signed)
Addended by: Roberts Gaudy on: 10/01/2015 08:33 AM   Modules accepted: Orders

## 2015-10-01 NOTE — Telephone Encounter (Signed)
Conversation  (Newest Message First)  Oscar Blend, RN      09/12/15 10:37 AM  Note    Rise Paganini, daughter that lives in [place], [place].  Pt authorizes [place] to speak with her.  Rise Paganini states that he has been more SOB past 3 days.  He has had to sleep in recliner because he can't lie in bed.  She states that when was in Rehab his Ramipril was d/c due to a cough.  She states ever since then his SOB has seem to be getting worse. Losartan was started on 8/9.  His BP has been in range of 160/60's.  He is taking Lasix 40 mg daily.  States he is not weighing daily.  Ankles slightly swollen but not any worse than usual.  He is wearing support hose.  States he has been sedentary since he has been in [place] (8/11) due to the SOB.  States since he will be here till November she has scheduled him to see cardiologist there on 9/15.  She is trying to watch his sodium intake.  Spoke w/Dr. Lovena Le (DOD) who suggest that he increase Lasix to 40 mg BID x 3 days.  Advised daughter.  Also suggested that he weigh daily in AM, keep legs elevated and continue to use support hose.  Advised to call us next week if SOB has not improved. She verbalizes understanding and will call if no improvement

## 2015-10-20 LAB — PROTIME-INR: INR: 2.3 — AB (ref 0.9–1.1)

## 2015-11-11 ENCOUNTER — Telehealth: Payer: Self-pay | Admitting: *Deleted

## 2015-11-11 LAB — PROTIME-INR: INR: 3 — AB (ref 0.9–1.1)

## 2015-11-11 NOTE — Telephone Encounter (Signed)
Pt is overdue for INR lab, therefore called the pt's dtr in [place] since he is with her (he is with [place]-dtr & she takes him to lab appts).  [place], pt's dtr, stated that since the pt has been staying in [place] for an extended amount of time they have established the pt with a Cardiologist there.  Also, she stated that the pt will have his INR checked today by the Doctor in [place] they will dose the pt & she would remind them to fax the results to use so we can have the INR in our records. Confirmed the fax # was correct for [facility].  She states the pt will be coming back to [place] next week to be with other dtr & will be coming into office for follow up or have labs done in [place].

## 2015-11-12 ENCOUNTER — Telehealth: Payer: Self-pay | Admitting: *Deleted

## 2015-11-12 NOTE — Telephone Encounter (Signed)
Pt has been out of town in [place] while there he has had his INR managed & dosed by [facility].  They have faxed over records as requested so we can have records that they have been managing while he has been in [place].  Per their records the pt's INR on 10/20/15 was 2.3 & on 11/11/15 INR was 3.0-they dosed both of these results. Pt will follow-up in [place] once he returns November 1st per dtr & they will call to make an appt.

## 2015-11-14 ENCOUNTER — Telehealth: Payer: Self-pay | Admitting: Internal Medicine

## 2015-11-14 NOTE — Telephone Encounter (Signed)
Called patient's daughter, Harrell Gave [facility]). Patient has an appointment on Friday with Dr. Lovena Le. Patient is not with his daughter that he lives with, at this time. Patient has been having some SOB, while out of town. Informed patient's daughter when patient gets back to town she can better assess his situation. Informed her that if he is having SOB, weight gain, elevated BP or HR to give our office a call. In the mean time, patient will plan to see Dr. Lovena Le on 11/21/15.

## 2015-11-14 NOTE — Telephone Encounter (Signed)
New Message   Pt daughter call requesting to speak with RN. Pt daughter states pt is out of town and will be back on Monday ; she would like to get pt seen by provider next week. Pt daughter states pt has been dealing with some SOB.. Pt daughter states he loses hes breathe with very short distance walking.ot daughter would like to know if pt can take a med that could help him until he able to see the doctor. Please call back to discuss

## 2015-11-18 NOTE — Telephone Encounter (Addendum)
**Note De-Identified Oscar Bruce Obfuscation** Per Dr Curt Bears the pt's daughter, Oscar Bruce, is advised to give him an extra dose of Furosemide 40 mg this afternoon and to keep his appt with Dr Lovena Le on 11/3. Also, she is advised to call us back if the pt is not better tomorrow. She is aware to call 911 or drive the pt to the closest ER if his sx worsen.  She verbalized understanding and is in agreement with plan.  Oscar Bruce is requesting a call from Dr Forde Dandy nurse to discuss lab work for the pt prior to [place] on 11/3.

## 2015-11-18 NOTE — Telephone Encounter (Signed)
Follow up      Pt c/o swelling: STAT is pt has developed SOB within 24 hours  1. How long have you been experiencing swelling?  Yesterday before he got on the plane 2. Where is the swelling located? Rt hand, rt arm, rt leg  3.  Are you currently taking a "fluid pill"?  yes  4.  Are you currently SOB?  Yes---somewhat normal for pt 5.  Have you traveled recently? Yes----pt flew from [place] last night

## 2015-11-18 NOTE — Telephone Encounter (Addendum)
**Note De-Identified Delita Chiquito Obfuscation** The pt states that he just flew in from [place] to [place], [place] last night and that he will be in [place] until the end of the year. He c/o swelling in his right arm, hand and leg that is worse than his left. He reports that he was having pain in his right thigh but not at this time. He states that his Ramipril was d/c'd while he was in rehab due to a hacking cough and since then he has been SOB. He takes Furosemide 40 mg daily and states that he has taken his am dose already this morning. He does have a f/u planned with Dr Lovena Le on 11/3.  He is advised that I will discuss with our DOD, Dr Curt Bears, and call him back with his recommendations.

## 2015-11-19 NOTE — Telephone Encounter (Signed)
Left msg for Santiago Glad to call our office.

## 2015-11-20 ENCOUNTER — Encounter: Payer: Self-pay | Admitting: Internal Medicine

## 2015-11-21 ENCOUNTER — Ambulatory Visit (INDEPENDENT_AMBULATORY_CARE_PROVIDER_SITE_OTHER): Payer: Medicare Other | Admitting: Internal Medicine

## 2015-11-21 ENCOUNTER — Encounter: Payer: Self-pay | Admitting: Internal Medicine

## 2015-11-21 VITALS — BP 162/74 | HR 61 | Ht 69.0 in | Wt 211.0 lb

## 2015-11-21 DIAGNOSIS — Z950 Presence of cardiac pacemaker: Secondary | ICD-10-CM

## 2015-11-21 NOTE — Progress Notes (Signed)
HPI Oscar Bruce returns today for followup after a long absence from our arrhythmia clinic. He is a very pleasant 82 year old man with a history of symptomatic bradycardia, chronic atrial fibrillation, complete heart block, status post pacemaker insertion. Since I saw him, he has lived in [place] with one of his daughters and most recently moved back to the [place] area to live with his other daughter. The patient has gained almost 30 lbs in the past 6 weeks and has had significant swelling. He admits to some dietary indiscretion but denies medical non-compliance. The patient has had severe PND and orthopnea. He has had total body swelling. Allergies  Allergen Reactions  . Penicillins Swelling    Has patient had a PCN reaction causing immediate rash, facial/tongue/throat swelling, SOB or lightheadedness with hypotension: NO Has patient had a PCN reaction causing severe rash involving mucus membranes or skin necrosis: NO Has patient had a PCN reaction that required hospitalization NO Has patient had a PCN reaction occurring within the last 10 years: NO If all of the above answers are "NO", then may proceed with Cephalosporin use.     Current Outpatient Prescriptions  Medication Sig Dispense Refill  . acetaminophen (TYLENOL) 500 MG chewable tablet Chew 500 mg by mouth every 6 (six) hours as needed. For pain    . atorvastatin (LIPITOR) 20 MG tablet Take 20 mg by mouth daily.    . Calcium Carbonate Antacid (MAALOX) 600 MG chewable tablet Chew 600 mg by mouth daily as needed.     . cloNIDine (CATAPRES) 0.2 MG tablet Take 0.4 mg by mouth at bedtime. Give 2 tabs to = 0.4 mg QHS, give 0.2 mg po q6h prn for SBP >160    . COUMADIN 5 MG tablet Take as directed by [facility] 35 tablet 3  . cyanocobalamin 100 MCG tablet Take 100 mcg by mouth daily.    . digoxin (LANOXIN) 0.125 MG tablet Take 0.125 mg by mouth daily.    . Exenatide (BYETTA 5 MCG PEN [place]) Inject 5 mcg into the skin daily.     .  furosemide (LASIX) 40 MG tablet Take 1 tablet (40 mg total) by mouth daily. 90 tablet 3  . insulin detemir (LEVEMIR) 100 UNIT/ML injection Inject 0.08 mLs (8 Units total) into the skin at bedtime. 10 mL 11  . losartan (COZAAR) 25 MG tablet Take 1 tablet (25 mg total) by mouth daily. 30 tablet 6  . metoprolol succinate (TOPROL-XL) 50 MG 24 hr tablet Take 75 mg by mouth daily. Take with or immediately following a meal.    . nitroGLYCERIN (NITROSTAT) 0.4 MG SL tablet Place 0.4 mg under the tongue every 5 (five) minutes as needed.      . potassium chloride SA (K-DUR,KLOR-CON) 20 MEQ tablet Take 20 mEq by mouth daily.     No current facility-administered medications for this visit.      Past Medical History:  Diagnosis Date  . A-fib ([place])   . CAD (coronary artery disease)   . Cancer [facility])    Colon  . Cellulitis of right lower extremity   . CHF (congestive heart failure) ([place])   . Chronic cough   . Chronic diastolic CHF (congestive heart failure) ([place])   . CKD (chronic kidney disease), stage III   . Diabetes mellitus type 2 in nonobese ([facility])   . Fall   . Generalized weakness   . HLD (hyperlipidemia)   . Hypertension   . Hyponatremia   . Leukocytosis   .  Protein calorie malnutrition ([place])   . PVD (peripheral vascular disease) ([place])     ROS:   All systems reviewed and negative except as noted in the HPI.   Past Surgical History:  Procedure Laterality Date  . CARDIAC CATHETERIZATION  2001, 2004  . CATARACT EXTRACTION  2000  . CHOLECYSTECTOMY    . CORONARY ARTERY BYPASS GRAFT    . PACEMAKER INSERTION       Family History  Problem Relation Age of Onset  . Diabetes Mother   . Coronary artery disease Father   . Diabetes Sister      Social History   Social History  . Marital status: Widowed    Spouse name: N/A  . Number of children: N/A  . Years of education: N/A   Occupational History  . Not on file.   Social History Main Topics  . Smoking status: Never Smoker   . Smokeless tobacco: Never Used  . Alcohol use No  . Drug use: No  . Sexual activity: Not on file   Other Topics Concern  . Not on file   Social History Narrative  . No narrative on file     BP (!) 162/74   Pulse 61   Ht 5\' 9"  (1.753 m)   Wt 211 lb (95.7 kg)   SpO2 97%   BMI 31.16 kg/m   Physical Exam:  elderly appearing 82 year old man,NAD HEENT: Unremarkable Neck:  12 cm JVD, no thyromegally Back:  No CVA tenderness Lungs:  Rales but no wheezes or rhonchi. HEART:  Regular rate rhythm, no murmurs, no rubs, no clicks, soft S4 gallop, questionable right-sided Abd:  soft, positive bowel sounds, no organomegally, no rebound, no guarding Ext:  2 plus pulses, 4+ peripheral edema extending into the scrotum and lower back and up to the arms, worse on the right than the left, no cyanosis, no clubbing Skin:  No rashes no nodules Neuro:  CN II through XII intact, motor grossly intact  DEVICE  Normal device function.  See PaceArt for details.   Assess/Plan: 1. Anasarca - I have discussed the treatment options with the patient and his daughter. He needs hospital admission and IV diuretic therapy. Because his daughter with whom he lives is in [place], I have recommended she drive back to [facility] and be seen in the ER there. 2. Chronic systolic heart failure - he has known LV dysfunction. He will need a 2D echo when he [facility]. He will need an ARB. He previously had a cough with ACE inhibitor. 3. PPM - his [employer] single chamber PPM is working normally. Will recheck in several months. 4. HTN -  His blood pressure is uncontrolled. He will need his meds adjusted.  Mikle Bosworth.D.

## 2015-11-21 NOTE — Patient Instructions (Addendum)
Medication Instructions:  Your physician recommends that you continue on your current medications as directed. Please refer to the Current Medication list given to you today.   Labwork: None Ordered   Testing/Procedures: None Ordered   Follow-Up: Follow-up with Dr. Taylor as needed    Any Other Special Instructions Will Be Listed Below (If Applicable).     If you need a refill on your cardiac medications before your next appointment, please call your pharmacy.   

## 2015-12-02 LAB — CUP PACEART INCLINIC DEVICE CHECK
Battery Remaining Longevity: 116 mo
Battery Voltage: 2.98 V
Brady Statistic RV Percent Paced: 3.5 %
Implantable Lead Implant Date: 20040804
Implantable Lead Location: 753860
Lead Channel Impedance Value: 387.5 Ohm
Lead Channel Impedance Value: 650 Ohm
Lead Channel Pacing Threshold Amplitude: 0.75 V
Lead Channel Setting Pacing Amplitude: 2 V
Lead Channel Setting Pacing Amplitude: 2.5 V
Lead Channel Setting Sensing Sensitivity: 2 mV
MDC IDC LEAD IMPLANT DT: 20040804
MDC IDC LEAD LOCATION: 753859
MDC IDC MSMT LEADCHNL RV PACING THRESHOLD PULSEWIDTH: 0.4 ms
MDC IDC MSMT LEADCHNL RV SENSING INTR AMPL: 11.3 mV
MDC IDC PG IMPLANT DT: 20111221
MDC IDC PG SERIAL: 2574597
MDC IDC SESS DTM: 20171103142738
MDC IDC SET LEADCHNL RV PACING PULSEWIDTH: 0.4 ms
MDC IDC STAT BRADY RA PERCENT PACED: 86 %

## 2015-12-05 ENCOUNTER — Ambulatory Visit: Payer: Medicare Other | Admitting: Internal Medicine

## 2015-12-10 ENCOUNTER — Telehealth: Payer: Self-pay | Admitting: Internal Medicine

## 2015-12-10 NOTE — Telephone Encounter (Signed)
New Message  Pt daughter call requesting to speak with RN about getting an prescription for pt to get a discount on a recliner for pt to sleep in. Pt daughter states she would like to purchase the chair today, so ask if she could get a call back today. Please call back to discss

## 2015-12-15 NOTE — Telephone Encounter (Signed)
Called, spoke with pt's daughter, Santiago Glad (on [place]). Daughter stated she no longer needed the Rx for a recliner. Recliner was purchased already.

## 2016-02-19 DEATH — deceased

## 2017-05-04 ENCOUNTER — Telehealth: Payer: Self-pay | Admitting: Internal Medicine

## 2017-05-04 NOTE — Telephone Encounter (Signed)
°  1. Has your device fired? No   2. Is you device beeping? No   3. Are you experiencing draining or swelling at device site? No  4. Are you calling to see if we received your device transmission? No  5. Have you passed out? No  Patient daughter Santiago Glad) calling to see what to do with home monitor machine as patient has passed away    Please route to [place]

## 2017-05-04 NOTE — Telephone Encounter (Signed)
LMOM that she can dispose of the home monitor however she wishes- [employer] does not have the patient's return them. Left call back # if needed.
